# Patient Record
Sex: Male | Born: 1950
Health system: Southern US, Community
[De-identification: ages and names within clinical notes are randomized; demographics above are authoritative.]

## PROBLEM LIST (undated history)

## (undated) DIAGNOSIS — C61 Malignant neoplasm of prostate: Secondary | ICD-10-CM

## (undated) DIAGNOSIS — Z973 Presence of spectacles and contact lenses: Secondary | ICD-10-CM

## (undated) DIAGNOSIS — R160 Hepatomegaly, not elsewhere classified: Secondary | ICD-10-CM

## (undated) DIAGNOSIS — J449 Chronic obstructive pulmonary disease, unspecified: Secondary | ICD-10-CM

## (undated) DIAGNOSIS — I10 Essential (primary) hypertension: Secondary | ICD-10-CM

## (undated) DIAGNOSIS — Z860100 Personal history of colon polyps, unspecified: Secondary | ICD-10-CM

## (undated) DIAGNOSIS — N529 Male erectile dysfunction, unspecified: Secondary | ICD-10-CM

## (undated) DIAGNOSIS — M51369 Other intervertebral disc degeneration, lumbar region without mention of lumbar back pain or lower extremity pain: Secondary | ICD-10-CM

## (undated) DIAGNOSIS — I440 Atrioventricular block, first degree: Secondary | ICD-10-CM

## (undated) DIAGNOSIS — Z9989 Dependence on other enabling machines and devices: Secondary | ICD-10-CM

## (undated) DIAGNOSIS — M1990 Unspecified osteoarthritis, unspecified site: Secondary | ICD-10-CM

## (undated) DIAGNOSIS — G4733 Obstructive sleep apnea (adult) (pediatric): Secondary | ICD-10-CM

## (undated) DIAGNOSIS — T7840XA Allergy, unspecified, initial encounter: Secondary | ICD-10-CM

## (undated) DIAGNOSIS — E785 Hyperlipidemia, unspecified: Secondary | ICD-10-CM

## (undated) DIAGNOSIS — M5136 Other intervertebral disc degeneration, lumbar region: Secondary | ICD-10-CM

## (undated) DIAGNOSIS — N281 Cyst of kidney, acquired: Secondary | ICD-10-CM

## (undated) DIAGNOSIS — G4730 Sleep apnea, unspecified: Secondary | ICD-10-CM

## (undated) DIAGNOSIS — E119 Type 2 diabetes mellitus without complications: Secondary | ICD-10-CM

## (undated) DIAGNOSIS — C20 Malignant neoplasm of rectum: Secondary | ICD-10-CM

## (undated) DIAGNOSIS — Z8546 Personal history of malignant neoplasm of prostate: Secondary | ICD-10-CM

## (undated) DIAGNOSIS — I480 Paroxysmal atrial fibrillation: Secondary | ICD-10-CM

## (undated) DIAGNOSIS — IMO0002 Reserved for concepts with insufficient information to code with codable children: Secondary | ICD-10-CM

## (undated) HISTORY — DX: Essential (primary) hypertension: I10

## (undated) HISTORY — DX: Hyperlipidemia, unspecified: E78.5

## (undated) HISTORY — PX: COLONOSCOPY: SHX174

## (undated) HISTORY — DX: Allergy, unspecified, initial encounter: T78.40XA

## (undated) HISTORY — PX: UPPER GASTROINTESTINAL ENDOSCOPY: SHX188

## (undated) HISTORY — DX: Unspecified osteoarthritis, unspecified site: M19.90

## (undated) HISTORY — PX: VASECTOMY: SHX75

## (undated) HISTORY — PX: CARDIOVASCULAR STRESS TEST: SHX262

## (undated) HISTORY — PX: OTHER SURGICAL HISTORY: SHX169

## (undated) HISTORY — DX: Malignant neoplasm of prostate: C61

## (undated) HISTORY — PX: KNEE ARTHROSCOPY: SUR90

## (undated) HISTORY — PX: POLYPECTOMY: SHX149

## (undated) HISTORY — DX: Hepatomegaly, not elsewhere classified: R16.0

## (undated) HISTORY — DX: Reserved for concepts with insufficient information to code with codable children: IMO0002

## (undated) HISTORY — DX: Type 2 diabetes mellitus without complications: E11.9

## (undated) HISTORY — DX: Sleep apnea, unspecified: G47.30

---

## 1997-12-16 ENCOUNTER — Other Ambulatory Visit: Admission: RE | Admit: 1997-12-16 | Discharge: 1997-12-16 | Payer: Self-pay | Admitting: Family Medicine

## 1999-09-04 HISTORY — PX: LUMBAR FUSION: SHX111

## 2001-11-30 ENCOUNTER — Emergency Department (HOSPITAL_COMMUNITY): Admission: EM | Admit: 2001-11-30 | Discharge: 2001-11-30 | Payer: Self-pay | Admitting: Emergency Medicine

## 2001-11-30 ENCOUNTER — Encounter: Payer: Self-pay | Admitting: Emergency Medicine

## 2006-09-23 ENCOUNTER — Ambulatory Visit: Payer: Self-pay | Admitting: Internal Medicine

## 2006-10-09 ENCOUNTER — Ambulatory Visit: Payer: Self-pay | Admitting: Internal Medicine

## 2006-10-23 ENCOUNTER — Ambulatory Visit: Payer: Self-pay | Admitting: Internal Medicine

## 2007-01-23 ENCOUNTER — Ambulatory Visit: Payer: Self-pay | Admitting: Internal Medicine

## 2007-01-23 LAB — CONVERTED CEMR LAB
ALT: 20 units/L (ref 0–40)
AST: 20 units/L (ref 0–37)
Albumin: 3.5 g/dL (ref 3.5–5.2)
Alkaline Phosphatase: 56 units/L (ref 39–117)
BUN: 16 mg/dL (ref 6–23)
Basophils Absolute: 0 10*3/uL (ref 0.0–0.1)
Basophils Relative: 0.1 % (ref 0.0–1.0)
Bilirubin, Direct: 0.2 mg/dL (ref 0.0–0.3)
CO2: 34 meq/L — ABNORMAL HIGH (ref 19–32)
Calcium: 9.3 mg/dL (ref 8.4–10.5)
Chloride: 102 meq/L (ref 96–112)
Cholesterol: 163 mg/dL (ref 0–200)
Creatinine, Ser: 1 mg/dL (ref 0.4–1.5)
Eosinophils Absolute: 0.1 10*3/uL (ref 0.0–0.6)
Eosinophils Relative: 1.8 % (ref 0.0–5.0)
GFR calc Af Amer: 99 mL/min
GFR calc non Af Amer: 82 mL/min
Glucose, Bld: 136 mg/dL — ABNORMAL HIGH (ref 70–99)
HCT: 42 % (ref 39.0–52.0)
HDL: 41.7 mg/dL (ref >39.0)
Hemoglobin: 14.1 g/dL (ref 13.0–17.0)
Hgb A1c MFr Bld: 6.5 % — ABNORMAL HIGH (ref 4.6–6.0)
LDL Cholesterol: 108 mg/dL — ABNORMAL HIGH (ref 0–99)
Lymphocytes Relative: 32.1 % (ref 12.0–46.0)
MCHC: 33.7 g/dL (ref 30.0–36.0)
MCV: 92 fL (ref 78.0–100.0)
Monocytes Absolute: 0.5 10*3/uL (ref 0.2–0.7)
Monocytes Relative: 9.6 % (ref 3.0–11.0)
Neutro Abs: 2.9 10*3/uL (ref 1.4–7.7)
Neutrophils Relative %: 56.4 % (ref 43.0–77.0)
PSA: 0.94 ng/mL (ref 0.10–4.00)
Platelets: 308 10*3/uL (ref 150–400)
Potassium: 2.8 meq/L — ABNORMAL LOW (ref 3.5–5.1)
RBC: 4.56 M/uL (ref 4.22–5.81)
RDW: 12.1 % (ref 11.5–14.6)
Sodium: 142 meq/L (ref 135–145)
TSH: 1.38 microintl units/mL (ref 0.35–5.50)
Total Bilirubin: 1.2 mg/dL (ref 0.3–1.2)
Total CHOL/HDL Ratio: 3.9
Total Protein: 6.7 g/dL (ref 6.0–8.3)
Triglycerides: 69 mg/dL (ref 0–149)
VLDL: 14 mg/dL (ref 0–40)
Vitamin B-12: 670 pg/mL (ref 211–911)
WBC: 5.1 10*3/uL (ref 4.5–10.5)

## 2007-05-27 ENCOUNTER — Ambulatory Visit: Payer: Self-pay | Admitting: Internal Medicine

## 2007-05-27 LAB — CONVERTED CEMR LAB
ALT: 25 units/L (ref 0–53)
AST: 24 units/L (ref 0–37)
BUN: 18 mg/dL (ref 6–23)
CO2: 33 meq/L — ABNORMAL HIGH (ref 19–32)
Calcium: 9.8 mg/dL (ref 8.4–10.5)
Chloride: 101 meq/L (ref 96–112)
Cholesterol: 199 mg/dL (ref 0–200)
Creatinine, Ser: 1.1 mg/dL (ref 0.4–1.5)
GFR calc Af Amer: 89 mL/min
GFR calc non Af Amer: 73 mL/min
Glucose, Bld: 103 mg/dL — ABNORMAL HIGH (ref 70–99)
HDL: 45.8 mg/dL (ref >39.0)
Hgb A1c MFr Bld: 6.4 % — ABNORMAL HIGH (ref 4.6–6.0)
LDL Cholesterol: 137 mg/dL — ABNORMAL HIGH (ref 0–99)
Potassium: 3.4 meq/L — ABNORMAL LOW (ref 3.5–5.1)
Sodium: 143 meq/L (ref 135–145)
Total CHOL/HDL Ratio: 4.3
Triglycerides: 83 mg/dL (ref 0–149)
VLDL: 17 mg/dL (ref 0–40)

## 2007-10-30 ENCOUNTER — Encounter: Payer: Self-pay | Admitting: Internal Medicine

## 2007-10-30 DIAGNOSIS — M545 Low back pain: Secondary | ICD-10-CM

## 2007-10-30 DIAGNOSIS — I10 Essential (primary) hypertension: Secondary | ICD-10-CM

## 2007-10-30 DIAGNOSIS — M1990 Unspecified osteoarthritis, unspecified site: Secondary | ICD-10-CM | POA: Insufficient documentation

## 2007-10-30 DIAGNOSIS — F528 Other sexual dysfunction not due to a substance or known physiological condition: Secondary | ICD-10-CM

## 2007-11-27 ENCOUNTER — Ambulatory Visit: Payer: Self-pay | Admitting: Internal Medicine

## 2007-11-27 DIAGNOSIS — M25519 Pain in unspecified shoulder: Secondary | ICD-10-CM

## 2007-11-27 DIAGNOSIS — E118 Type 2 diabetes mellitus with unspecified complications: Secondary | ICD-10-CM

## 2007-12-18 ENCOUNTER — Telehealth (INDEPENDENT_AMBULATORY_CARE_PROVIDER_SITE_OTHER): Payer: Self-pay | Admitting: *Deleted

## 2008-02-02 ENCOUNTER — Encounter: Payer: Self-pay | Admitting: Internal Medicine

## 2008-02-03 ENCOUNTER — Ambulatory Visit: Payer: Self-pay | Admitting: Internal Medicine

## 2008-02-03 DIAGNOSIS — R55 Syncope and collapse: Secondary | ICD-10-CM | POA: Insufficient documentation

## 2008-02-03 DIAGNOSIS — E876 Hypokalemia: Secondary | ICD-10-CM

## 2008-02-03 DIAGNOSIS — J019 Acute sinusitis, unspecified: Secondary | ICD-10-CM | POA: Insufficient documentation

## 2008-02-05 LAB — CONVERTED CEMR LAB
ALT: 26 units/L (ref 0–53)
AST: 22 units/L (ref 0–37)
Albumin: 3.6 g/dL (ref 3.5–5.2)
Alkaline Phosphatase: 60 units/L (ref 39–117)
BUN: 13 mg/dL (ref 6–23)
Basophils Absolute: 0.1 10*3/uL (ref 0.0–0.1)
Basophils Relative: 1 % (ref 0.0–1.0)
Bilirubin Urine: NEGATIVE
Bilirubin, Direct: 0.1 mg/dL (ref 0.0–0.3)
CO2: 34 meq/L — ABNORMAL HIGH (ref 19–32)
Calcium: 9.8 mg/dL (ref 8.4–10.5)
Chloride: 96 meq/L (ref 96–112)
Creatinine, Ser: 0.9 mg/dL (ref 0.4–1.5)
Eosinophils Absolute: 0.1 10*3/uL (ref 0.0–0.7)
Eosinophils Relative: 1.8 % (ref 0.0–5.0)
GFR calc Af Amer: 111 mL/min
GFR calc non Af Amer: 92 mL/min
Glucose, Bld: 98 mg/dL (ref 70–99)
HCT: 43.5 % (ref 39.0–52.0)
Hemoglobin, Urine: NEGATIVE
Hemoglobin: 15.2 g/dL (ref 13.0–17.0)
Hgb A1c MFr Bld: 6.4 % — ABNORMAL HIGH (ref 4.6–6.0)
Ketones, ur: NEGATIVE mg/dL
Leukocytes, UA: NEGATIVE
Lymphocytes Relative: 41.5 % (ref 12.0–46.0)
MCHC: 35 g/dL (ref 30.0–36.0)
MCV: 91.1 fL (ref 78.0–100.0)
Monocytes Absolute: 0.6 10*3/uL (ref 0.1–1.0)
Monocytes Relative: 8.6 % (ref 3.0–12.0)
Neutro Abs: 3.6 10*3/uL (ref 1.4–7.7)
Neutrophils Relative %: 47.1 % (ref 43.0–77.0)
Nitrite: NEGATIVE
Platelets: 335 10*3/uL (ref 150–400)
Potassium: 3.1 meq/L — ABNORMAL LOW (ref 3.5–5.1)
RBC: 4.77 M/uL (ref 4.22–5.81)
RDW: 12.3 % (ref 11.5–14.6)
Sodium: 142 meq/L (ref 135–145)
Specific Gravity, Urine: 1.02 (ref 1.000–1.03)
TSH: 1.83 microintl units/mL (ref 0.35–5.50)
Total Bilirubin: 1.2 mg/dL (ref 0.3–1.2)
Total Protein, Urine: NEGATIVE mg/dL
Total Protein: 7.3 g/dL (ref 6.0–8.3)
Urine Glucose: NEGATIVE mg/dL
Urobilinogen, UA: 1 (ref 0.0–1.0)
WBC: 7.5 10*3/uL (ref 4.5–10.5)
pH: 7.5 (ref 5.0–8.0)

## 2008-03-02 ENCOUNTER — Ambulatory Visit: Payer: Self-pay | Admitting: Internal Medicine

## 2008-03-02 DIAGNOSIS — R05 Cough: Secondary | ICD-10-CM

## 2008-03-02 DIAGNOSIS — E785 Hyperlipidemia, unspecified: Secondary | ICD-10-CM | POA: Insufficient documentation

## 2008-03-02 DIAGNOSIS — K921 Melena: Secondary | ICD-10-CM

## 2008-07-14 ENCOUNTER — Ambulatory Visit: Payer: Self-pay | Admitting: Internal Medicine

## 2008-07-15 ENCOUNTER — Encounter: Payer: Self-pay | Admitting: Internal Medicine

## 2008-07-15 LAB — CONVERTED CEMR LAB
ALT: 26 units/L (ref 0–53)
AST: 26 units/L (ref 0–37)
Albumin: 3.5 g/dL (ref 3.5–5.2)
Alkaline Phosphatase: 58 units/L (ref 39–117)
BUN: 13 mg/dL (ref 6–23)
Basophils Absolute: 0 10*3/uL (ref 0.0–0.1)
Basophils Relative: 0.6 % (ref 0.0–3.0)
Bilirubin Urine: NEGATIVE
Bilirubin, Direct: 0.2 mg/dL (ref 0.0–0.3)
CO2: 35 meq/L — ABNORMAL HIGH (ref 19–32)
Calcium: 9.1 mg/dL (ref 8.4–10.5)
Chloride: 96 meq/L (ref 96–112)
Cholesterol: 204 mg/dL (ref 0–200)
Creatinine, Ser: 1 mg/dL (ref 0.4–1.5)
Direct LDL: 125.4 mg/dL
Eosinophils Absolute: 0.1 10*3/uL (ref 0.0–0.7)
Eosinophils Relative: 1.7 % (ref 0.0–5.0)
GFR calc Af Amer: 99 mL/min
GFR calc non Af Amer: 82 mL/min
Glucose, Bld: 127 mg/dL — ABNORMAL HIGH (ref 70–99)
HCT: 43.2 % (ref 39.0–52.0)
HDL: 42.6 mg/dL (ref >39.0)
Hemoglobin, Urine: NEGATIVE
Hemoglobin: 14.8 g/dL (ref 13.0–17.0)
Ketones, ur: NEGATIVE mg/dL
Leukocytes, UA: NEGATIVE
Lymphocytes Relative: 39.4 % (ref 12.0–46.0)
MCHC: 34.3 g/dL (ref 30.0–36.0)
MCV: 89.7 fL (ref 78.0–100.0)
Monocytes Absolute: 0.6 10*3/uL (ref 0.1–1.0)
Monocytes Relative: 8.3 % (ref 3.0–12.0)
Neutro Abs: 3.4 10*3/uL (ref 1.4–7.7)
Neutrophils Relative %: 50 % (ref 43.0–77.0)
Nitrite: NEGATIVE
PSA: 1.13 ng/mL (ref 0.10–4.00)
Platelets: 314 10*3/uL (ref 150–400)
Potassium: 2.9 meq/L — ABNORMAL LOW (ref 3.5–5.1)
RBC: 4.81 M/uL (ref 4.22–5.81)
RDW: 12.3 % (ref 11.5–14.6)
Sodium: 140 meq/L (ref 135–145)
Specific Gravity, Urine: 1.01 (ref 1.000–1.03)
TSH: 1.98 microintl units/mL (ref 0.35–5.50)
Total Bilirubin: 1.4 mg/dL — ABNORMAL HIGH (ref 0.3–1.2)
Total CHOL/HDL Ratio: 4.8
Total Protein, Urine: NEGATIVE mg/dL
Total Protein: 7.1 g/dL (ref 6.0–8.3)
Triglycerides: 89 mg/dL (ref 0–149)
Urine Glucose: NEGATIVE mg/dL
Urobilinogen, UA: 0.2 (ref 0.0–1.0)
VLDL: 18 mg/dL (ref 0–40)
WBC: 6.7 10*3/uL (ref 4.5–10.5)
pH: 7.5 (ref 5.0–8.0)

## 2008-07-16 ENCOUNTER — Encounter: Payer: Self-pay | Admitting: Internal Medicine

## 2008-11-16 ENCOUNTER — Ambulatory Visit: Payer: Self-pay | Admitting: Internal Medicine

## 2008-11-17 LAB — CONVERTED CEMR LAB
ALT: 21 units/L (ref 0–53)
AST: 21 units/L (ref 0–37)
Albumin: 3.5 g/dL (ref 3.5–5.2)
Alkaline Phosphatase: 58 units/L (ref 39–117)
BUN: 12 mg/dL (ref 6–23)
Basophils Absolute: 0.1 10*3/uL (ref 0.0–0.1)
Basophils Relative: 2 % (ref 0.0–3.0)
Bilirubin, Direct: 0.1 mg/dL (ref 0.0–0.3)
CO2: 30 meq/L (ref 19–32)
Calcium: 9.3 mg/dL (ref 8.4–10.5)
Chloride: 107 meq/L (ref 96–112)
Creatinine, Ser: 0.8 mg/dL (ref 0.4–1.5)
Eosinophils Absolute: 0.1 10*3/uL (ref 0.0–0.7)
Eosinophils Relative: 1.6 % (ref 0.0–5.0)
GFR calc non Af Amer: 127.17 mL/min (ref >60)
Glucose, Bld: 99 mg/dL (ref 70–99)
HCT: 43.9 % (ref 39.0–52.0)
Hemoglobin: 15 g/dL (ref 13.0–17.0)
Hgb A1c MFr Bld: 6.3 % (ref 4.6–6.5)
Lymphocytes Relative: 33 % (ref 12.0–46.0)
Lymphs Abs: 1.9 10*3/uL (ref 0.7–4.0)
MCHC: 34.2 g/dL (ref 30.0–36.0)
MCV: 89.1 fL (ref 78.0–100.0)
Monocytes Absolute: 0.4 10*3/uL (ref 0.1–1.0)
Monocytes Relative: 7.3 % (ref 3.0–12.0)
Neutro Abs: 3.2 10*3/uL (ref 1.4–7.7)
Neutrophils Relative %: 56.1 % (ref 43.0–77.0)
Platelets: 302 10*3/uL (ref 150.0–400.0)
Potassium: 3.7 meq/L (ref 3.5–5.1)
RBC: 4.93 M/uL (ref 4.22–5.81)
RDW: 12.9 % (ref 11.5–14.6)
Sodium: 144 meq/L (ref 135–145)
TSH: 1.7 microintl units/mL (ref 0.35–5.50)
Total Bilirubin: 1.2 mg/dL (ref 0.3–1.2)
Total Protein: 7 g/dL (ref 6.0–8.3)
WBC: 5.7 10*3/uL (ref 4.5–10.5)

## 2008-12-07 ENCOUNTER — Ambulatory Visit: Payer: Self-pay | Admitting: Internal Medicine

## 2008-12-07 DIAGNOSIS — K625 Hemorrhage of anus and rectum: Secondary | ICD-10-CM

## 2008-12-07 DIAGNOSIS — Z860100 Personal history of colon polyps, unspecified: Secondary | ICD-10-CM | POA: Insufficient documentation

## 2009-01-05 ENCOUNTER — Telehealth: Payer: Self-pay | Admitting: Internal Medicine

## 2009-03-04 ENCOUNTER — Ambulatory Visit: Payer: Self-pay | Admitting: Internal Medicine

## 2009-03-22 ENCOUNTER — Ambulatory Visit: Payer: Self-pay | Admitting: Internal Medicine

## 2009-03-22 ENCOUNTER — Encounter: Payer: Self-pay | Admitting: Internal Medicine

## 2009-03-24 ENCOUNTER — Encounter: Payer: Self-pay | Admitting: Internal Medicine

## 2009-03-25 ENCOUNTER — Telehealth: Payer: Self-pay | Admitting: Internal Medicine

## 2009-06-17 ENCOUNTER — Encounter (INDEPENDENT_AMBULATORY_CARE_PROVIDER_SITE_OTHER): Payer: Self-pay | Admitting: *Deleted

## 2009-06-23 ENCOUNTER — Encounter: Payer: Self-pay | Admitting: Internal Medicine

## 2009-07-08 ENCOUNTER — Ambulatory Visit: Payer: Self-pay | Admitting: Internal Medicine

## 2009-07-08 DIAGNOSIS — J449 Chronic obstructive pulmonary disease, unspecified: Secondary | ICD-10-CM

## 2009-07-08 DIAGNOSIS — Z87891 Personal history of nicotine dependence: Secondary | ICD-10-CM

## 2009-08-19 ENCOUNTER — Observation Stay (HOSPITAL_COMMUNITY): Admission: AD | Admit: 2009-08-19 | Discharge: 2009-08-20 | Payer: Self-pay | Admitting: Cardiology

## 2009-08-19 ENCOUNTER — Ambulatory Visit: Payer: Self-pay | Admitting: Internal Medicine

## 2009-08-19 ENCOUNTER — Ambulatory Visit: Payer: Self-pay | Admitting: Cardiology

## 2009-08-19 DIAGNOSIS — R079 Chest pain, unspecified: Secondary | ICD-10-CM | POA: Insufficient documentation

## 2009-08-19 LAB — CONVERTED CEMR LAB: Blood Glucose, Fingerstick: 110

## 2009-08-20 ENCOUNTER — Encounter: Payer: Self-pay | Admitting: Internal Medicine

## 2009-08-22 ENCOUNTER — Telehealth: Payer: Self-pay | Admitting: Internal Medicine

## 2009-08-31 ENCOUNTER — Ambulatory Visit: Payer: Self-pay | Admitting: Internal Medicine

## 2009-09-07 ENCOUNTER — Telehealth (INDEPENDENT_AMBULATORY_CARE_PROVIDER_SITE_OTHER): Payer: Self-pay | Admitting: *Deleted

## 2009-09-08 ENCOUNTER — Encounter (INDEPENDENT_AMBULATORY_CARE_PROVIDER_SITE_OTHER): Payer: Self-pay | Admitting: *Deleted

## 2009-09-08 ENCOUNTER — Ambulatory Visit: Payer: Self-pay

## 2009-09-08 ENCOUNTER — Encounter: Payer: Self-pay | Admitting: Cardiovascular Disease

## 2009-09-08 ENCOUNTER — Ambulatory Visit: Payer: Self-pay | Admitting: Internal Medicine

## 2009-09-08 ENCOUNTER — Encounter (HOSPITAL_COMMUNITY): Admission: RE | Admit: 2009-09-08 | Discharge: 2009-11-07 | Payer: Self-pay | Admitting: Internal Medicine

## 2009-11-08 ENCOUNTER — Ambulatory Visit: Payer: Self-pay | Admitting: Internal Medicine

## 2009-11-08 DIAGNOSIS — M79609 Pain in unspecified limb: Secondary | ICD-10-CM

## 2009-11-10 LAB — CONVERTED CEMR LAB
ALT: 25 units/L (ref 0–53)
AST: 23 units/L (ref 0–37)
Albumin: 3.7 g/dL (ref 3.5–5.2)
Alkaline Phosphatase: 58 units/L (ref 39–117)
BUN: 14 mg/dL (ref 6–23)
Basophils Absolute: 0 10*3/uL (ref 0.0–0.1)
Basophils Relative: 0.5 % (ref 0.0–3.0)
Bilirubin Urine: NEGATIVE
Bilirubin, Direct: 0.2 mg/dL (ref 0.0–0.3)
CO2: 32 meq/L (ref 19–32)
Calcium: 9.4 mg/dL (ref 8.4–10.5)
Chloride: 101 meq/L (ref 96–112)
Cholesterol: 200 mg/dL (ref 0–200)
Creatinine, Ser: 0.9 mg/dL (ref 0.4–1.5)
Eosinophils Absolute: 0.1 10*3/uL (ref 0.0–0.7)
Eosinophils Relative: 1.6 % (ref 0.0–5.0)
GFR calc non Af Amer: 110.64 mL/min (ref >60)
Glucose, Bld: 117 mg/dL — ABNORMAL HIGH (ref 70–99)
HCT: 44 % (ref 39.0–52.0)
HDL: 52.5 mg/dL (ref >39.00)
Hemoglobin, Urine: NEGATIVE
Hemoglobin: 14.5 g/dL (ref 13.0–17.0)
Hgb A1c MFr Bld: 6.1 % (ref 4.6–6.5)
Ketones, ur: NEGATIVE mg/dL
LDL Cholesterol: 123 mg/dL — ABNORMAL HIGH (ref 0–99)
Leukocytes, UA: NEGATIVE
Lymphocytes Relative: 37.3 % (ref 12.0–46.0)
Lymphs Abs: 2.2 10*3/uL (ref 0.7–4.0)
MCHC: 32.9 g/dL (ref 30.0–36.0)
MCV: 92.6 fL (ref 78.0–100.0)
Monocytes Absolute: 0.5 10*3/uL (ref 0.1–1.0)
Monocytes Relative: 8.8 % (ref 3.0–12.0)
Neutro Abs: 3.2 10*3/uL (ref 1.4–7.7)
Neutrophils Relative %: 51.8 % (ref 43.0–77.0)
Nitrite: NEGATIVE
PSA: 1.38 ng/mL (ref 0.10–4.00)
Platelets: 319 10*3/uL (ref 150.0–400.0)
Potassium: 3.3 meq/L — ABNORMAL LOW (ref 3.5–5.1)
RBC: 4.75 M/uL (ref 4.22–5.81)
RDW: 12.2 % (ref 11.5–14.6)
Sodium: 141 meq/L (ref 135–145)
Specific Gravity, Urine: 1.025 (ref 1.000–1.030)
TSH: 2.49 microintl units/mL (ref 0.35–5.50)
Total Bilirubin: 1 mg/dL (ref 0.3–1.2)
Total CHOL/HDL Ratio: 4
Total Protein, Urine: NEGATIVE mg/dL
Total Protein: 7.2 g/dL (ref 6.0–8.3)
Triglycerides: 123 mg/dL (ref 0.0–149.0)
Urine Glucose: NEGATIVE mg/dL
Urobilinogen, UA: 0.2 (ref 0.0–1.0)
VLDL: 24.6 mg/dL (ref 0.0–40.0)
WBC: 6 10*3/uL (ref 4.5–10.5)
pH: 6.5 (ref 5.0–8.0)

## 2010-01-09 ENCOUNTER — Telehealth: Payer: Self-pay | Admitting: Internal Medicine

## 2010-01-27 ENCOUNTER — Telehealth: Payer: Self-pay | Admitting: Internal Medicine

## 2010-02-01 ENCOUNTER — Telehealth (INDEPENDENT_AMBULATORY_CARE_PROVIDER_SITE_OTHER): Payer: Self-pay | Admitting: *Deleted

## 2010-03-09 ENCOUNTER — Ambulatory Visit: Payer: Self-pay | Admitting: Internal Medicine

## 2010-03-09 DIAGNOSIS — T6701XA Heatstroke and sunstroke, initial encounter: Secondary | ICD-10-CM

## 2010-03-09 DIAGNOSIS — R269 Unspecified abnormalities of gait and mobility: Secondary | ICD-10-CM

## 2010-03-09 DIAGNOSIS — F29 Unspecified psychosis not due to a substance or known physiological condition: Secondary | ICD-10-CM | POA: Insufficient documentation

## 2010-03-10 LAB — CONVERTED CEMR LAB
ALT: 22 units/L (ref 0–53)
AST: 23 units/L (ref 0–37)
Albumin: 3.7 g/dL (ref 3.5–5.2)
Alkaline Phosphatase: 58 units/L (ref 39–117)
BUN: 17 mg/dL (ref 6–23)
Basophils Absolute: 0 10*3/uL (ref 0.0–0.1)
Basophils Relative: 0.6 % (ref 0.0–3.0)
Bilirubin Urine: NEGATIVE
Bilirubin, Direct: 0.2 mg/dL (ref 0.0–0.3)
CO2: 31 meq/L (ref 19–32)
Calcium: 9.5 mg/dL (ref 8.4–10.5)
Chloride: 101 meq/L (ref 96–112)
Creatinine, Ser: 0.9 mg/dL (ref 0.4–1.5)
Eosinophils Absolute: 0.1 10*3/uL (ref 0.0–0.7)
Eosinophils Relative: 1.9 % (ref 0.0–5.0)
GFR calc non Af Amer: 113.42 mL/min (ref >60)
Glucose, Bld: 85 mg/dL (ref 70–99)
HCT: 43 % (ref 39.0–52.0)
Hemoglobin, Urine: NEGATIVE
Hemoglobin: 14.8 g/dL (ref 13.0–17.0)
Hgb A1c MFr Bld: 6.1 % (ref 4.6–6.5)
Ketones, ur: NEGATIVE mg/dL
Leukocytes, UA: NEGATIVE
Lymphocytes Relative: 40.2 % (ref 12.0–46.0)
Lymphs Abs: 2.4 10*3/uL (ref 0.7–4.0)
MCHC: 34.3 g/dL (ref 30.0–36.0)
MCV: 92.3 fL (ref 78.0–100.0)
Monocytes Absolute: 0.6 10*3/uL (ref 0.1–1.0)
Monocytes Relative: 9.7 % (ref 3.0–12.0)
Neutro Abs: 2.8 10*3/uL (ref 1.4–7.7)
Neutrophils Relative %: 47.6 % (ref 43.0–77.0)
Nitrite: NEGATIVE
Platelets: 324 10*3/uL (ref 150.0–400.0)
Potassium: 3.6 meq/L (ref 3.5–5.1)
RBC: 4.66 M/uL (ref 4.22–5.81)
RDW: 13.1 % (ref 11.5–14.6)
Sed Rate: 21 mm/hr (ref 0–22)
Sodium: 140 meq/L (ref 135–145)
Specific Gravity, Urine: 1.02 (ref 1.000–1.030)
TSH: 2.27 microintl units/mL (ref 0.35–5.50)
Total Bilirubin: 1.4 mg/dL — ABNORMAL HIGH (ref 0.3–1.2)
Total Protein, Urine: NEGATIVE mg/dL
Total Protein: 7.2 g/dL (ref 6.0–8.3)
Urine Glucose: NEGATIVE mg/dL
Urobilinogen, UA: 0.2 (ref 0.0–1.0)
Vitamin B-12: 633 pg/mL (ref 211–911)
WBC: 5.9 10*3/uL (ref 4.5–10.5)
pH: 6 (ref 5.0–8.0)

## 2010-09-24 ENCOUNTER — Encounter: Payer: Self-pay | Admitting: Internal Medicine

## 2010-10-03 NOTE — Miscellaneous (Signed)
Summary: Outpatient Coinsurance Notice  Outpatient Coinsurance Notice   Imported By: Marylou Mccoy 09/13/2009 15:47:37  _____________________________________________________________________  External Attachment:    Type:   Image     Comment:   External Document

## 2010-10-03 NOTE — Progress Notes (Signed)
  Phone Note Call from Patient   Caller: Patient Summary of Call: Patient called stating he complete a release form and asked for it to be placed on file for future use. After discussion with Pam, she authorized for the release to be printed and forwarded to [facility] for processing.  Request: Send information

## 2010-10-03 NOTE — Assessment & Plan Note (Signed)
Summary: Cardiology Nuclear Study  Nuclear Med Background Indications for Stress Test: Evaluation for Ischemia, Post Hospital, Abnormal EKG  Indications Comments: 08/20/09 CP, R/O MI, (-) enzymes   History Comments: NO DOCUMENTED CAD  Symptoms: Chest Pain, Nausea  Symptoms Comments: Last episode of ZO:XWRU since admission.   Nuclear Pre-Procedure Cardiac Risk Factors: History of Smoking, Hypertension, Lipids, NIDDM Caffeine/Decaff Intake: None NPO After: 6:00 AM Lungs: Clear IV 0.9% NS with Angio Cath: 20g     IV Site: (R) AC IV Started by: Stanton Kidney EMT-P Chest Size (in) 44-46     Height (in): 74 Weight (lb): 235 BMI: 30.28 Tech Comments: FBS:100 @5 :45 am per patient  Nuclear Med Study 1 or 2 day study:  1 day     Stress Test Type:  Stress Reading MD:  Dietrich Pates, MD     Referring MD:  Sonda Primes, MD Resting Radionuclide:  Technetium 39m Tetrofosmin     Resting Radionuclide Dose:  11.0 mCi  Stress Radionuclide:  Technetium 50m Tetrofosmin     Stress Radionuclide Dose:  33.0 mCi   Stress Protocol Exercise Time (min):  9:00 min     Max HR:  155 bpm     Predicted Max HR:  161 bpm  Max Systolic BP: 195 mm Hg     Percent Max HR:  96.27 %     METS: 10.4 Rate Pressure Product:  04540    Stress Test Technologist:  Rea College CMA-N     Nuclear Technologist:  Burna Mortimer Deal RT-N  Rest Procedure  Myocardial perfusion imaging was performed at rest 45 minutes following the intravenous administration of Myoview Technetium 44m Tetrofosmin.  Stress Procedure  The patient exercised for nine minutes.  The patient stopped due to fatigue and dyspnea.  He denied any chest pain.  There were no significant ST-T wave changes, only frequent PVC's with one episode of a multifocal triplet.  Myoview was injected at peak exercise and myocardial perfusion imaging was performed after a brief delay.  QPS Raw Data Images:  Stress images were motion corrected.  Soft tissue (diaphragm, bowel  activity) underlie heart. Stress Images:  There is normal uptake in all areas. Rest Images:  Normal homogeneous uptake in all areas of the myocardium. Subtraction (SDS):  No evidence of ischemia. Transient Ischemic Dilatation:  .92  (Normal <1.22)  Lung/Heart Ratio:  .33  (Normal <0.45)  Quantitative Gated Spect Images QGS EDV:  118 ml QGS ESV:  46 ml QGS EF:  61 %   Overall Impression  Exercise Capacity: Good exercise capacity. BP Response: Normal blood pressure response. Clinical Symptoms: No chest pain ECG Impression: No significant ST segment change suggestive of ischemia. Overall Impression: Normal stress nuclear study.  Appended Document: Cardiology Nuclear Sharmon Leyden, please, inform - normal stress test   Thanks, AP  Appended Document: Cardiology Nuclear Study called pt-------lmom with normal stress test results/vg

## 2010-10-03 NOTE — Progress Notes (Signed)
Summary: cialis  Phone Note From Pharmacy   Caller: [facility]* Summary of Call: Per pharmacy, pt request refill on cialis 20mg  once daily as needed . This was removed off of EMR medication list , please advise  Initial call taken by: Rock Nephew CMA,  Jan 27, 2010 9:29 AM  Follow-up for Phone Call        ok to ref x 6 Follow-up by: Tresa Garter MD,  Jan 27, 2010 3:24 PM    New/Updated Medications: CIALIS 20 MG TABS (TADALAFIL) 1 once daily as needed Prescriptions: CIALIS 20 MG TABS (TADALAFIL) 1 once daily as needed  #12 x 1   Entered by:   Lamar Sprinkles, CMA   Authorized by:   Tresa Garter MD   Signed by:   Lamar Sprinkles, CMA on 01/27/2010   Method used:   Electronically to        [facility]* (retail)       [address]       [place], [place]  [address]       Ph: [phone]       Fax: [phone]   RxID:   [id number]

## 2010-10-03 NOTE — Assessment & Plan Note (Signed)
Summary: 4 MO ROV /NWS  #   Vital Signs:  Patient profile:   60 year old male Weight:      234 pounds Temp:     98.4 degrees F oral Pulse rate:   81 / minute BP sitting:   146 / 82  (left arm)  Vitals Entered By: Tora Perches (November 08, 2009 8:05 AM) CC: f/u Is Patient Diabetic? No   Primary Care Provider:  Jacinta Shoe MD  CC:  f/u.  History of Present Illness: The patient presents for a wellness examination  The patient presents for a follow up of hypertension, diabetes, hyperlipidemia  C/o R foot pain in am x weeks  Preventive Screening-Counseling & Management  Alcohol-Tobacco     Smoking Status: quit  Caffeine-Diet-Exercise     Does Patient Exercise: yes  Current Medications (verified): 1)  Actoplus Met 15-850 Mg  Tabs (Pioglitazone Hcl-Metformin Hcl) .... Take 1 Tablet By Mouth Two Times A Day 2)  Meloxicam 15 Mg Tabs (Meloxicam) .... One By Mouth Daily Pc As Needed Arthritis 3)  Adult Aspirin Low Strength 81 Mg  Tbdp (Aspirin) .... Once Daily 4)  Tribenzor 40-10-25 Mg Tabs (Olmesartan-Amlodipine-Hctz) .Marland Kitchen.. 1 By Mouth Qd 5)  Vitamin D3 1000 Unit  Tabs (Cholecalciferol) .Marland Kitchen.. 1 By Mouth Daily 6)  Aspirin 81 Mg Tbec (Aspirin) .Marland Kitchen.. 1 By Mouth Qd  Allergies: 1)  ! 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, still teaching Married Former Smoker Alcohol use-no Regular exercise-yes, walks Does Patient Exercise:  yes  Review of Systems  The patient denies anorexia, fever, weight loss, weight gain, vision loss, decreased hearing, hoarseness, chest pain,  syncope, dyspnea on exertion, peripheral edema, prolonged cough, headaches, hemoptysis, abdominal pain, melena, hematochezia, severe indigestion/heartburn, hematuria, incontinence, genital sores, muscle weakness, suspicious skin lesions, transient blindness, difficulty walking, depression, unusual weight change, abnormal bleeding, enlarged lymph nodes, angioedema, and testicular masses.    Physical Exam  General:  alert, well-developed, well-nourished, and cooperative to examination.    Ears:  normal pinnae bilaterally, without erythema, swelling, or tenderness to palpation. TMs clear, without effusion, or cerumen impaction. Hearing grossly normal bilaterally  Nose:  No deformity, discharge,  or lesions. Mouth:  teeth and gums in good repair; mucous membranes moist, without lesions or ulcers. oropharynx clear without exudate, erythema.  Lungs:  normal respiratory effort, no intercostal retractions or use of accessory muscles; normal breath sounds bilaterally - no crackles and no wheezes.    Heart:  normal rate, regular rhythm, no murmur, and no rub. BLE without edema. normal DP pulses and normal cap refill in all 4 extremities    Abdomen:  soft, non-tender, normal bowel sounds, no distention; no masses and no appreciable hepatomegaly or splenomegaly.   Rectal:  No external abnormalities noted. Normal sphincter tone. No rectal masses or tenderness. Genitalia:  Testes bilaterally descended without nodularity, tenderness or masses. No scrotal masses or lesions. No penis lesions or urethral discharge. Prostate:  no gland enlargement and no nodules.   Msk:  No deformity or scoliosis noted of thoracic or lumbar spine.  R heel is tender Extremities:  No clubbing, cyanosis, edema, or  deformity noted with normal full range of motion of all joints.   Neurologic:  alert & oriented X3 and cranial nerves II-XII symetrically intact.  strength normal in all extremities, sensation intact to light touch, and gait  normal. speech fluent without dysarthria or aphasia; follows commands with good comprehension.  Skin:  Intact without suspicious lesions or rashes Cervical Nodes:  No lymphadenopathy noted Inguinal Nodes:  No significant adenopathy Psych:  Cognition and judgment appear intact. Alert and cooperative with normal attention span and concentration. No apparent delusions, illusions, hallucinations   Impression & Recommendations:  Problem # 1:  PHYSICAL EXAMINATION (ICD-V70.0) Assessment New Labs is pending  Health and age related issues were discussed. Available screening tests and vaccinations were discussed as well. Healthy life style including good diet and execise was discussed.   Problem # 2:  HYPERLIPIDEMIA (ICD-272.4) Assessment: Unchanged  Problem # 3:  DIABETES MELLITUS, TYPE II (ICD-250.00)  His updated medication list for this problem includes:    Actoplus Met 15-850 Mg Tabs (Pioglitazone hcl-metformin hcl) .Marland Kitchen... Take 1 tablet by mouth two times a day    Adult Aspirin Low Strength 81 Mg Tbdp (Aspirin) ..... Once daily    Tribenzor 40-10-25 Mg Tabs (Olmesartan-amlodipine-hctz) .Marland Kitchen... 1 by mouth qd    Aspirin 81 Mg Tbec (Aspirin) .Marland Kitchen... 1 by mouth qd  Problem # 4:  HYPOKALEMIA (ICD-276.8) Assessment: Improved  Problem # 5:  FOOT PAIN (ICD-729.5) R plantar fasciitis  Problem # 6:  CHEST PAIN UNSPECIFIED (ICD-786.50) resolved Assessment: Comment Only  Complete Medication List: 1)  Actoplus Met 15-850 Mg Tabs (Pioglitazone hcl-metformin hcl) .... Take 1 tablet by mouth two times a day 2)  Meloxicam 15 Mg Tabs (Meloxicam) .... One by mouth daily pc as needed arthritis 3)  Adult Aspirin Low Strength 81 Mg Tbdp (Aspirin) .... Once daily 4)  Tribenzor 40-10-25 Mg Tabs (Olmesartan-amlodipine-hctz) .Marland Kitchen.. 1 by mouth qd 5)  Vitamin D3 1000 Unit Tabs (Cholecalciferol) .Marland Kitchen.. 1 by mouth daily 6)  Aspirin 81 Mg Tbec (Aspirin) .Marland Kitchen.. 1 by mouth qd 7)  Pennsaid 1.5 % Soln (Diclofenac sodium) ....  3-5 gtt on skin three times a day for pain  Other Orders: Pneumococcal Vaccine (19147) Admin 1st Vaccine (82956) Admin 1st Vaccine [facility]) [id number])  Patient Instructions: 1)  Please schedule a follow-up appointment in 4 months. 2)  BMP prior to visit, ICD-9: 3)  HbgA1C prior to visit, ICD-9:250.00 4)  Ice to R heel as needed  Prescriptions: PENNSAID 1.5 % SOLN (DICLOFENAC SODIUM) 3-5 gtt on skin three times a day for pain  #1 x 3   Entered and Authorized by:   Tresa Garter MD   Signed by:   Tresa Garter MD on 11/08/2009   Method used:   Print then Give to Patient   RxID:   [id number]    Pneumovax Vaccine    Vaccine Type: Pneumovax    Site: left deltoid    Mfr: Merck    Dose: 0.5 ml    Route: IM    Given by: Tora Perches    Exp. Date: 04/17/2011    Lot #: [id number]    VIS given: 03/31/96 version given November 08, 2009.

## 2010-10-03 NOTE — Progress Notes (Signed)
Summary: Nuclear Pre-Procedure  Phone Note Call from Patient   Caller: Spouse Summary of Call: Reviewed information on Myoview Information Sheet (see scanned document for further details).  Spoke with patient's wife.         Nuclear Med Background Indications for Stress Test: Evaluation for Ischemia, Post Hospital  Indications Comments: 08/20/09 CP R/O MI (-) enzymes    Symptoms: Chest Pain, Nausea    Nuclear Pre-Procedure Cardiac Risk Factors: History of Smoking, Hypertension, Lipids, NIDDM Height (in): 74  Nuclear Med Study Referring MD:  A.V. Plotnikov

## 2010-10-03 NOTE — Assessment & Plan Note (Signed)
Summary: 4 mos f/u #cd   Vital Signs:  Patient profile:   60 year old male Height:      74 inches (187.96 cm) Weight:      236 pounds (107.27 kg) BMI:     30.41 O2 Sat:      98 % on Room air Temp:     97.9 degrees F (36.61 degrees C) oral Pulse rate:   82 / minute Pulse rhythm:   regular Resp:     16 per minute BP sitting:   120 / 70  (left arm) Cuff size:   regular  Vitals Entered By: Lanier Prude, CMA(AAMA) (March 09, 2010 9:21 AM)  O2 Flow:  Room air CC: f/u Is Patient Diabetic? Yes   Primary Care Provider:  Jacinta Shoe MD  CC:  f/u.  History of Present Illness: The patient presents for a follow up of hypertension, diabetes, hyperlipidemia  Last wk played golf  11-5 in the heat- came home and was talking crazy, going to bathroom all the time, inappropriate w/friends at dinner. Next day felt tired and CBG was 146. No CP, HA F/u HTN, DM. No new meds, no OTC meds ie Benadryl.Marland KitchenMarland KitchenNo ETOH  Preventive Screening-Counseling & Management  Alcohol-Tobacco     Smoking Status: quit  Current Medications (verified): 1)  Actoplus Met 15-850 Mg  Tabs (Pioglitazone Hcl-Metformin Hcl) .... Take 1 Tablet By Mouth Two Times A Day 2)  Meloxicam 15 Mg Tabs (Meloxicam) .... One By Mouth Daily Pc As Needed Arthritis 3)  Adult Aspirin Low Strength 81 Mg  Tbdp (Aspirin) .... Once Daily 4)  Tribenzor 40-10-25 Mg Tabs (Olmesartan-Amlodipine-Hctz) .Marland Kitchen.. 1 By Mouth Qd 5)  Vitamin D3 1000 Unit  Tabs (Cholecalciferol) .Marland Kitchen.. 1 By Mouth Daily 6)  Aspirin 81 Mg Tbec (Aspirin) .Marland Kitchen.. 1 By Mouth Qd 7)  Pennsaid 1.5 % Soln (Diclofenac Sodium) .... 3-5 Gtt On Skin Three Times A Day For Pain 8)  Cialis 20 Mg Tabs (Tadalafil) .Marland Kitchen.. 1 Once Daily As Needed  Allergies (verified): 1)  ! Viagra (Sildenafil Citrate) 2)  Benazepril Hcl (Benazepril Hcl) 3)  Zocor  Past History:  Past Medical History: Last updated: 07/08/2009 Hypertension Low back pain Osteoarthritis ED Diabetes mellitus, type  II Hyperlipidemia blood in stool COPD moder on PFT 2010 due to previous h/o 20y of smoking  Past Surgical History: Last updated: 12/07/2008 Back Surgery Knee Arthroscopy  Family History: Last updated: 12/07/2008 Family History Hypertension No FH of Colon Cancer: Family History of Pancreatic Cancer:father Family History of Diabetes: mom Family History of Heart Disease: mom  Social History: Last updated: 11/08/2009 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         No HA  Physical Exam  General:  alert, well-developed, well-nourished, and cooperative to examination.    Eyes:  vision grossly intact; pupils equal, round and reactive to light.  conjunctiva and lids normal.    Nose:  No deformity, discharge,  or lesions. Mouth:  teeth and gums in good repair; mucous membranes moist, without lesions or ulcers. oropharynx clear without exudate, erythema.  Neck:  No deformities, masses, or tenderness noted. Lungs:  normal respiratory effort, no intercostal retractions or use of accessory muscles; normal breath sounds bilaterally - no crackles and no wheezes.    Heart:  Normal rate and regular  rhythm. S1 and S2 normal without gallop, murmur, click, rub or other extra sounds. Abdomen:  Bowel sounds positive,abdomen soft and non-tender without masses, organomegaly or hernias noted. Msk:  No deformity or scoliosis noted of thoracic or lumbar spine.   Extremities:  No clubbing, cyanosis, edema, or deformity noted with normal full range of motion of all joints.   Neurologic:  No cranial nerve deficits noted.  Plantar reflexes are down-going bilaterally. DTRs are symmetrical throughout. Sensory, motor and coordinative functions appear intact.A little  ataxic gait.   Romberg neg Skin:  Intact without suspicious lesions or rashes Cervical Nodes:  No lymphadenopathy noted Inguinal Nodes:  No significant adenopathy Psych:  Cognition and judgment appear intact. Alert and cooperative with normal attention span and concentration. No apparent delusions, illusions, hallucinations   Impression & Recommendations:  Problem # 1:  MENTAL CONFUSION (ICD-298.9) Assessment New Diff dx is broad: CVA, heat stroke, infection etc Orders: Radiology Referral (Radiology) head CT TLB-B12, Serum-Total ONLY (16109-U04) TLB-BMP (Basic Metabolic Panel-BMET) (80048-METABOL) TLB-CBC Platelet - w/Differential (85025-CBCD) TLB-Hepatic/Liver Function Pnl (80076-HEPATIC) TLB-TSH (Thyroid Stimulating Hormone) (84443-TSH) TLB-Sedimentation Rate (ESR) (85652-ESR) TLB-Udip ONLY (81003-UDIP)  Problem # 2:  HEAT STROKE (ICD-992.0) most likely Assessment: New  Orders: TLB-B12, Serum-Total ONLY (54098-J19) TLB-BMP (Basic Metabolic Panel-BMET) (80048-METABOL) TLB-CBC Platelet - w/Differential (85025-CBCD) TLB-Hepatic/Liver Function Pnl (80076-HEPATIC) TLB-TSH (Thyroid Stimulating Hormone) (84443-TSH) TLB-Sedimentation Rate (ESR) (85652-ESR) TLB-Udip ONLY (81003-UDIP) Radiology Referral (Radiology)  Problem # 3:  HYPERTENSION (ICD-401.9) Assessment: Unchanged  His updated medication list for this problem includes:    Tribenzor 40-10-25 Mg Tabs (Olmesartan-amlodipine-hctz) .Marland Kitchen... 1 by mouth qd  Problem # 4:  COPD (ICD-496) Assessment: Unchanged  Problem # 5:  GAIT DISTURBANCE (ICD-781.2) resolved Assessment: New  Orders: Radiology Referral (Radiology) TLB-B12, Serum-Total ONLY (14782-N56) TLB-BMP (Basic Metabolic Panel-BMET) (80048-METABOL) TLB-CBC Platelet - w/Differential (85025-CBCD) TLB-Hepatic/Liver Function Pnl (80076-HEPATIC) TLB-TSH (Thyroid Stimulating Hormone) (84443-TSH) TLB-Sedimentation Rate (ESR) (85652-ESR) TLB-Udip ONLY (81003-UDIP)  Complete  Medication List: 1)  Actoplus Met 15-850 Mg Tabs (Pioglitazone hcl-metformin hcl) .... Take 1 tablet by mouth two times a day 2)  Meloxicam 15 Mg Tabs (Meloxicam) .... One by mouth daily pc as needed arthritis 3)  Adult Aspirin Low Strength 81 Mg Tbdp (Aspirin) .... Once daily 4)  Tribenzor 40-10-25 Mg Tabs (Olmesartan-amlodipine-hctz) .Marland Kitchen.. 1 by mouth qd 5)  Vitamin D3 1000 Unit Tabs (Cholecalciferol) .Marland Kitchen.. 1 by mouth daily 6)  Aspirin 81 Mg Tbec (Aspirin) .Marland Kitchen.. 1 by mouth qd 7)  Pennsaid 1.5 % Soln (Diclofenac sodium) .... 3-5 gtt on skin three times a day for pain 8)  Cialis 20 Mg Tabs (Tadalafil) .Marland Kitchen.. 1 once daily as needed  Other Orders: TLB-A1C / Hgb A1C (Glycohemoglobin) (83036-A1C)  Patient Instructions: 1)  Please schedule a follow-up appointment in 2 - 3 weeks. 2)  Call if you are not better in a reasonable amount of time or if worse. 3)  Take 3 aspirin 81 mg a day for now

## 2010-10-03 NOTE — Progress Notes (Signed)
Summary: Schedule Colonoscopy  Phone Note Outgoing Call Call back at [facility] Phone [phone]   Call placed by: Harlow Mares CMA [facility]),  Jan 09, 2010 4:06 PM Call placed to: Patient Summary of Call: patients wife states that patient is aware he is due for his colonoscopy and she will advise him he needs to call and make an appt to have his procedure done.  Initial call taken by: Harlow Mares CMA [facility]),  Jan 09, 2010 4:07 PM

## 2010-12-04 LAB — CBC
HCT: 40.1 % (ref 39.0–52.0)
Platelets: 274 10*3/uL (ref 150–400)
RDW: 13.2 % (ref 11.5–15.5)

## 2010-12-04 LAB — CARDIAC PANEL(CRET KIN+CKTOT+MB+TROPI)
CK, MB: 0.9 ng/mL (ref 0.3–4.0)
CK, MB: 1.1 ng/mL (ref 0.3–4.0)
Relative Index: 0.6 (ref 0.0–2.5)
Relative Index: 0.6 (ref 0.0–2.5)
Relative Index: 0.6 (ref 0.0–2.5)
Troponin I: 0.02 ng/mL (ref 0.00–0.06)

## 2010-12-04 LAB — GLUCOSE, CAPILLARY
Glucose-Capillary: 115 mg/dL — ABNORMAL HIGH (ref 70–99)
Glucose-Capillary: 98 mg/dL (ref 70–99)

## 2010-12-04 LAB — LIPID PANEL
Cholesterol: 170 mg/dL (ref 0–200)
LDL Cholesterol: 110 mg/dL — ABNORMAL HIGH (ref 0–99)
Triglycerides: 86 mg/dL (ref <150)
VLDL: 17 mg/dL (ref 0–40)

## 2010-12-04 LAB — BASIC METABOLIC PANEL
BUN: 16 mg/dL (ref 6–23)
Chloride: 103 mEq/L (ref 96–112)
Potassium: 3.5 mEq/L (ref 3.5–5.1)

## 2010-12-04 LAB — COMPREHENSIVE METABOLIC PANEL
AST: 23 U/L (ref 0–37)
Albumin: 3.5 g/dL (ref 3.5–5.2)
Alkaline Phosphatase: 52 U/L (ref 39–117)
Chloride: 105 mEq/L (ref 96–112)
GFR calc Af Amer: >60 mL/min (ref >60)
Potassium: 3.4 mEq/L — ABNORMAL LOW (ref 3.5–5.1)
Total Bilirubin: 1.3 mg/dL — ABNORMAL HIGH (ref 0.3–1.2)

## 2010-12-04 LAB — CULTURE, BLOOD (ROUTINE X 2)

## 2010-12-04 LAB — HEMOGLOBIN A1C: Hgb A1c MFr Bld: 6.1 % (ref 4.6–6.1)

## 2010-12-10 LAB — GLUCOSE, CAPILLARY: Glucose-Capillary: 118 mg/dL — ABNORMAL HIGH (ref 70–99)

## 2011-01-19 NOTE — Assessment & Plan Note (Signed)
[facility]                           PRIMARY CARE OFFICE NOTE   HAIDEN, RAWLINSON                    MRN:          [id number]  DATE:10/23/2006                            DOB:          February 05, 1951    The patient is a 60 year old male who has not been seen here in over 3  years.  Presents to reestablish primary   PAST MEDICAL HISTORY:  As per June 06, 2000 note.   FAMILY HISTORY:  As per June 06, 2000 note.   SOCIAL HISTORY:  As per June 06, 2000 note.   ALLERGIES:  NONE.   CURRENT MEDICATIONS:  1. Atenolol 25 mg 3 tablets daily.  2. Avandamet 10/998 twice a day.  3. Simvastatin 80 mg 1/2 daily.  4. Lisinopril 40 mg daily.  5. Amlodipine 10 mg daily.  6. Hydrochlorothiazide 25 mg daily.   REVIEW OF SYSTEMS:  No chest pain, shortness of breath.  Occasional  tingling in the right hand when typing on the computer.  He really wants  to cut back on the number of medicines he is taking.  He has been seeing  Dr. Hall Busing at the [facility].  The rest of the 18-point system review is  negative.   PHYSICAL EXAMINATION:  Blood pressure 139/82.  Pulse 67.  Temperature  98.6.  Weight 236 pounds.  Looks well.  HEENT:  With moist mucosa.  NECK:  Supple.  No thyromegaly or bruits.  LUNGS:  Clear to auscultation and percussion.  No wheezes or rales.  HEART:  S1 and S2.  No murmur.  No gallop.  ABDOMEN:  Soft and non-tender.  No organomegaly.  LOWER EXTREMITIES:  Without edema.  He is alert and cooperative.  Denies being depressed.  SKIN:  Clear.  LABS:  Colonoscopy on October 09, 2006 clear.   ASSESSMENT AND PLAN:  1. Type 2 diabetes.  Obtain lab work.  Discontinue Avandamet after      discussion.  Start Actos plusMet 15/850, one twice a day.  2. Hypertension.  Discontinue amlodipine and Lotensin.  Start Lotrel      10/40 daily.  3. Fatigue.  Discontinue Atenolol and the hydrochlorothiazide.  Start      Tenoretic 100/25 daily.  4. Dyslipidemia.   On therapy.  Check lipids.  5. Erectile dysfunction.  Viagra p.r.n.  6. Colon polyps.  Do next colonoscopy in 5 years.     Georgina Quint. Plotnikov, MD  Electronically Signed    AVP/MedQ  DD: 10/24/2006  DT: 10/24/2006  Job #: [id number]

## 2011-02-26 ENCOUNTER — Other Ambulatory Visit: Payer: Self-pay | Admitting: Internal Medicine

## 2011-02-26 ENCOUNTER — Other Ambulatory Visit (INDEPENDENT_AMBULATORY_CARE_PROVIDER_SITE_OTHER): Payer: Self-pay

## 2011-02-26 DIAGNOSIS — Z0389 Encounter for observation for other suspected diseases and conditions ruled out: Secondary | ICD-10-CM

## 2011-02-26 DIAGNOSIS — Z0000 Encounter for general adult medical examination without abnormal findings: Secondary | ICD-10-CM

## 2011-02-26 LAB — HEPATIC FUNCTION PANEL
ALT: 19 U/L (ref 0–53)
AST: 20 U/L (ref 0–37)
Bilirubin, Direct: 0.1 mg/dL (ref 0.0–0.3)
Total Bilirubin: 1.1 mg/dL (ref 0.3–1.2)

## 2011-02-26 LAB — BASIC METABOLIC PANEL
BUN: 20 mg/dL (ref 6–23)
Calcium: 8.8 mg/dL (ref 8.4–10.5)
Creatinine, Ser: 1 mg/dL (ref 0.4–1.5)
GFR: 101.04 mL/min (ref >60.00)
Potassium: 3.2 mEq/L — ABNORMAL LOW (ref 3.5–5.1)

## 2011-02-26 LAB — URINALYSIS
Nitrite: NEGATIVE
Specific Gravity, Urine: 1.02 (ref 1.000–1.030)
Total Protein, Urine: NEGATIVE
Urine Glucose: NEGATIVE
pH: 6.5 (ref 5.0–8.0)

## 2011-02-26 LAB — CBC WITH DIFFERENTIAL/PLATELET
Eosinophils Relative: 1.3 % (ref 0.0–5.0)
HCT: 39.9 % (ref 39.0–52.0)
Hemoglobin: 13.7 g/dL (ref 13.0–17.0)
Lymphs Abs: 2 10*3/uL (ref 0.7–4.0)
MCV: 91.4 fl (ref 78.0–100.0)
Monocytes Absolute: 0.4 10*3/uL (ref 0.1–1.0)
Monocytes Relative: 7 % (ref 3.0–12.0)
Neutro Abs: 3.4 10*3/uL (ref 1.4–7.7)
Platelets: 332 10*3/uL (ref 150.0–400.0)
RDW: 12.8 % (ref 11.5–14.6)

## 2011-02-26 LAB — LIPID PANEL: Cholesterol: 197 mg/dL (ref 0–200)

## 2011-02-26 LAB — PSA: PSA: 1.82 ng/mL (ref 0.10–4.00)

## 2011-02-28 ENCOUNTER — Other Ambulatory Visit (INDEPENDENT_AMBULATORY_CARE_PROVIDER_SITE_OTHER): Payer: BC Managed Care – PPO

## 2011-02-28 ENCOUNTER — Ambulatory Visit (INDEPENDENT_AMBULATORY_CARE_PROVIDER_SITE_OTHER): Payer: BC Managed Care – PPO | Admitting: Internal Medicine

## 2011-02-28 ENCOUNTER — Encounter: Payer: Self-pay | Admitting: Internal Medicine

## 2011-02-28 ENCOUNTER — Other Ambulatory Visit: Payer: BC Managed Care – PPO

## 2011-02-28 ENCOUNTER — Ambulatory Visit (INDEPENDENT_AMBULATORY_CARE_PROVIDER_SITE_OTHER)
Admission: RE | Admit: 2011-02-28 | Discharge: 2011-02-28 | Disposition: A | Discharge status: Home / Self Care | Payer: BC Managed Care – PPO | Source: Ambulatory Visit | Attending: Internal Medicine | Admitting: Internal Medicine

## 2011-02-28 VITALS — BP 130/70 | HR 76 | Temp 98.5°F | Resp 16 | Ht 74.0 in | Wt 234.0 lb

## 2011-02-28 DIAGNOSIS — E119 Type 2 diabetes mellitus without complications: Secondary | ICD-10-CM

## 2011-02-28 DIAGNOSIS — E785 Hyperlipidemia, unspecified: Secondary | ICD-10-CM

## 2011-02-28 DIAGNOSIS — M1710 Unilateral primary osteoarthritis, unspecified knee: Secondary | ICD-10-CM

## 2011-02-28 DIAGNOSIS — Z0000 Encounter for general adult medical examination without abnormal findings: Secondary | ICD-10-CM

## 2011-02-28 DIAGNOSIS — I10 Essential (primary) hypertension: Secondary | ICD-10-CM

## 2011-02-28 LAB — URIC ACID: Uric Acid, Serum: 8.1 mg/dL — ABNORMAL HIGH (ref 4.0–7.8)

## 2011-02-28 MED ORDER — MELOXICAM 15 MG PO TABS: 15.0000 mg | ORAL_TABLET | Freq: Every day | ORAL | Status: DC | PRN

## 2011-02-28 MED ORDER — AMOXICILLIN 500 MG PO CAPS: 1000.0000 mg | ORAL_CAPSULE | Freq: Two times a day (BID) | ORAL | Status: AC

## 2011-02-28 MED ORDER — PREDNISONE 10 MG PO TABS: ORAL_TABLET | ORAL | Status: DC

## 2011-02-28 MED ORDER — TRAMADOL HCL 50 MG PO TABS: 50.0000 mg | ORAL_TABLET | Freq: Two times a day (BID) | ORAL | Status: AC | PRN

## 2011-02-28 NOTE — Progress Notes (Signed)
Subjective:    Patient ID: [name] [name], male    DOB: 1950-11-04, 60 y.o.   MRN: [id number]  HPI  The patient is here for a wellness exam. The patient has been doing well overall without major physical or psychological issues going on lately. The patient needs to address  chronic hypertension that has been well controlled with medicines; to address chronic  hyperlipidemia controlled with medicines as well; and to address type 2 chronic diabetes, controlled with medical treatment and diet.  C/O B knees pain  X 2 wks ago swollen and tight. Motrin did not help: pain is 8-10 Stiff in am x 10 min. Restless. Wt Readings from Last 3 Encounters:  02/28/11 234 lb (106.142 kg)  03/09/10 236 lb (107.049 kg)  11/08/09 234 lb (106.142 kg)      Review of Systems  Constitutional: Positive for fatigue. Negative for appetite change and unexpected weight change.  HENT: Negative for nosebleeds, congestion, sore throat, sneezing, trouble swallowing and neck pain.   Eyes: Negative for itching and visual disturbance.  Respiratory: Negative for cough.   Cardiovascular: Negative for chest pain, palpitations and leg swelling.  Gastrointestinal: Negative for nausea, diarrhea, blood in stool and abdominal distention.  Genitourinary: Negative for frequency and hematuria.  Musculoskeletal: Positive for back pain, joint swelling (B knees), arthralgias and gait problem. Negative for myalgias.  Skin: Negative for rash.  Neurological: Negative for dizziness, tremors, speech difficulty and weakness.  Psychiatric/Behavioral: Negative for suicidal ideas, sleep disturbance, dysphoric mood and agitation. The patient is not nervous/anxious.        Objective:   Physical Exam  Constitutional: He is oriented to person, place, and time. He appears well-developed and well-nourished. No distress.  HENT:  Head: Normocephalic and atraumatic.  Right Ear: External ear normal.  Left Ear: External ear normal.  Nose: Nose  normal.  Mouth/Throat: Oropharynx is clear and moist. No oropharyngeal exudate.  Eyes: Conjunctivae and EOM are normal. Pupils are equal, round, and reactive to light. Right eye exhibits no discharge. Left eye exhibits no discharge. No scleral icterus.  Neck: Normal range of motion. Neck supple. No JVD present. No tracheal deviation present. No thyromegaly present.  Cardiovascular: Normal rate, regular rhythm, normal heart sounds and intact distal pulses.  Exam reveals no gallop and no friction rub.   No murmur heard. Pulmonary/Chest: Effort normal and breath sounds normal. No stridor. No respiratory distress. He has no wheezes. He has no rales. He exhibits no tenderness.  Abdominal: Soft. Bowel sounds are normal. He exhibits no distension and no mass. There is no tenderness. There is no rebound and no guarding.  Genitourinary: Guaiac negative stool. No penile tenderness.       Will do next time  Musculoskeletal: Normal range of motion. He exhibits edema and tenderness.       B knees are swollen and stiff and tired  Lymphadenopathy:    He has no cervical adenopathy.  Neurological: He is alert and oriented to person, place, and time. He has normal reflexes. No cranial nerve deficit. He exhibits normal muscle tone. Coordination normal.  Skin: Skin is warm and dry. No rash noted. He is not diaphoretic. No erythema. No pallor.  Psychiatric: He has a normal mood and affect. His behavior is normal. Judgment and thought content normal.       Lab Results  Component Value Date   WBC 5.9 02/26/2011   HGB 13.7 02/26/2011   HCT 39.9 02/26/2011   PLT 332.0 02/26/2011  CHOL 197 02/26/2011   TRIG 69.0 02/26/2011   HDL 44.40 02/26/2011   LDLDIRECT 125.4 07/14/2008   ALT 19 02/26/2011   AST 20 02/26/2011   NA 139 02/26/2011   K 3.2* 02/26/2011   CL 101 02/26/2011   CREATININE 1.0 02/26/2011   BUN 20 02/26/2011   CO2 32 02/26/2011   TSH 1.44 02/26/2011   PSA 1.82 02/26/2011   HGBA1C 6.1 03/09/2010       Assessment & Plan:   A complex case

## 2011-03-01 LAB — RHEUMATOID FACTOR: Rhuematoid fact SerPl-aCnc: <10 IU/mL (ref <=14)

## 2011-03-01 LAB — VITAMIN D 25 HYDROXY (VIT D DEFICIENCY, FRACTURES): Vit D, 25-Hydroxy: 39 ng/mL (ref 30–89)

## 2011-03-03 ENCOUNTER — Encounter: Payer: Self-pay | Admitting: Internal Medicine

## 2011-03-03 DIAGNOSIS — M1710 Unilateral primary osteoarthritis, unspecified knee: Secondary | ICD-10-CM | POA: Insufficient documentation

## 2011-03-03 DIAGNOSIS — Z0000 Encounter for general adult medical examination without abnormal findings: Secondary | ICD-10-CM | POA: Insufficient documentation

## 2011-03-03 NOTE — Assessment & Plan Note (Signed)
On Rx 

## 2011-03-03 NOTE — Assessment & Plan Note (Signed)
We discussed age appropriate health related issues, including available/recomended screening tests and vaccinations. We discussed a need for adhering to healthy diet and exercise. Labs/EKG were reviewed/ordered. All questions were answered.   

## 2011-03-03 NOTE — Assessment & Plan Note (Signed)
Obtain labs See Meds  Potential benefits of a short/long term steroid  use as well as potential risks  and complications were explained to the patient and were aknowledged.

## 2011-03-13 ENCOUNTER — Encounter: Payer: Self-pay | Admitting: Internal Medicine

## 2011-03-13 ENCOUNTER — Ambulatory Visit (INDEPENDENT_AMBULATORY_CARE_PROVIDER_SITE_OTHER): Payer: BC Managed Care – PPO | Admitting: Internal Medicine

## 2011-03-13 DIAGNOSIS — M109 Gout, unspecified: Secondary | ICD-10-CM | POA: Insufficient documentation

## 2011-03-13 DIAGNOSIS — I10 Essential (primary) hypertension: Secondary | ICD-10-CM

## 2011-03-13 DIAGNOSIS — M1710 Unilateral primary osteoarthritis, unspecified knee: Secondary | ICD-10-CM

## 2011-03-13 MED ORDER — DICLOFENAC SODIUM 1.5 % TD SOLN: 3.0000 [drp] | Freq: Three times a day (TID) | TRANSDERMAL | Status: DC | PRN

## 2011-03-13 MED ORDER — AMLODIPINE-OLMESARTAN 10-40 MG PO TABS: 1.0000 | ORAL_TABLET | Freq: Every day | ORAL | Status: DC

## 2011-03-13 NOTE — Assessment & Plan Note (Signed)
D/c HCTZ Cont Mobic 50% better after Prednisone

## 2011-03-13 NOTE — Progress Notes (Signed)
Subjective:    Patient ID: [name] [name], male    DOB: 1951-05-09, 60 y.o.   MRN: [id number]  HPI  F/u HTN,labs F/u B knee pain and swelling - 50% better Subjective:    Patient ID: [name] [name], male    DOB: 1951/05/06, 60 y.o.   MRN: [id number]      Review of Systems  Constitutional: Positive forless  fatigue. Negative for appetite change and unexpected weight change.  HENT: Negative for nosebleeds, congestion, sore throat, sneezing, trouble swallowing and neck pain.   Eyes: Negative for itching and visual disturbance.  Respiratory: Negative for cough.   Cardiovascular: Negative for chest pain, palpitations and leg swelling.  Gastrointestinal: Negative for nausea, diarrhea, blood in stool and abdominal distention.  Genitourinary: Negative for frequency and hematuria.  Musculoskeletal: Positive for less back pain, less  joint swelling (B knees), less arthralgias and less gait problem. Negative for myalgias.  Skin: Negative for rash.  Neurological: Negative for dizziness, tremors, speech difficulty and weakness.  Psychiatric/Behavioral: Negative for suicidal ideas, sleep disturbance, dysphoric mood and agitation. The patient is not nervous/anxious.        Objective:   Physical Exam  Constitutional: He is oriented to person, place, and time. He appears well-developed and well-nourished. No distress.  HENT:  Head: Normocephalic and atraumatic.  Right Ear: External ear normal.  Left Ear: External ear normal.  Nose: Nose normal.  Mouth/Throat: Oropharynx is clear and moist. No oropharyngeal exudate.  Eyes: Conjunctivae and EOM are normal. Pupils are equal, round, and reactive to light. Right eye exhibits no discharge. Left eye exhibits no discharge. No scleral icterus.  Neck: Normal range of motion. Neck supple. No JVD present. No tracheal deviation present. No thyromegaly present.  Cardiovascular: Normal rate, regular rhythm, normal heart sounds and intact distal pulses.   Exam reveals no gallop and no friction rub.   No murmur heard. Pulmonary/Chest: Effort normal and breath sounds normal. No stridor. No respiratory distress. He has no wheezes. He has no rales. He exhibits no tenderness.  Abdominal: Soft. Bowel sounds are normal. He exhibits no distension and no mass. There is no tenderness. There is no rebound and no guarding.  Musculoskeletal: Normal range of motion. He exhibits less edema and less tenderness in B knees.       B knees are swollen and stiff and tired  Lymphadenopathy:    He has no cervical adenopathy.  Neurological: He is alert and oriented to person, place, and time. He has normal reflexes. No cranial nerve deficit. He exhibits normal muscle tone. Coordination normal.  Skin: Skin is warm and dry. No rash noted. He is not diaphoretic. No erythema. No pallor.  Psychiatric: He has a normal mood and affect. His behavior is normal. Judgment and thought content normal.       Lab Results      Assessment & Plan:      Review of Systems     Objective:   Physical Exam        Assessment & Plan:   Lab Results  Component Value Date   WBC 5.9 02/26/2011   HGB 13.7 02/26/2011   HCT 39.9 02/26/2011   PLT 332.0 02/26/2011   CHOL 197 02/26/2011   TRIG 69.0 02/26/2011   HDL 44.40 02/26/2011   LDLDIRECT 125.4 07/14/2008   ALT 19 02/26/2011   AST 20 02/26/2011   NA 139 02/26/2011   K 3.2* 02/26/2011   CL 101 02/26/2011   CREATININE 1.0 02/26/2011  BUN 20 02/26/2011   CO2 32 02/26/2011   TSH 1.44 02/26/2011   PSA 1.82 02/26/2011   HGBA1C 6.1 03/09/2010

## 2011-03-13 NOTE — Assessment & Plan Note (Signed)
[facility] in place of [employer]

## 2011-03-13 NOTE — Assessment & Plan Note (Signed)
Remove HCTZ Cont rx

## 2011-03-15 ENCOUNTER — Other Ambulatory Visit: Payer: Self-pay | Admitting: *Deleted

## 2011-03-15 MED ORDER — DICLOFENAC SODIUM 1 % TD GEL: 1.0000 "application " | Freq: Four times a day (QID) | TRANSDERMAL | Status: DC

## 2011-04-23 ENCOUNTER — Other Ambulatory Visit: Payer: Self-pay | Admitting: *Deleted

## 2011-04-23 MED ORDER — TADALAFIL 20 MG PO TABS: 20.0000 mg | ORAL_TABLET | Freq: Every day | ORAL | Status: DC | PRN

## 2011-05-17 ENCOUNTER — Ambulatory Visit: Payer: BC Managed Care – PPO | Admitting: Internal Medicine

## 2011-07-06 ENCOUNTER — Ambulatory Visit (INDEPENDENT_AMBULATORY_CARE_PROVIDER_SITE_OTHER): Payer: BC Managed Care – PPO | Admitting: Internal Medicine

## 2011-07-06 ENCOUNTER — Encounter: Payer: Self-pay | Admitting: Internal Medicine

## 2011-07-06 ENCOUNTER — Ambulatory Visit (INDEPENDENT_AMBULATORY_CARE_PROVIDER_SITE_OTHER)
Admission: RE | Admit: 2011-07-06 | Discharge: 2011-07-06 | Disposition: A | Discharge status: Home / Self Care | Payer: BC Managed Care – PPO | Source: Ambulatory Visit | Attending: Internal Medicine | Admitting: Internal Medicine

## 2011-07-06 VITALS — BP 140/98 | HR 84 | Temp 98.4°F | Resp 16 | Wt 246.0 lb

## 2011-07-06 DIAGNOSIS — R209 Unspecified disturbances of skin sensation: Secondary | ICD-10-CM

## 2011-07-06 DIAGNOSIS — E119 Type 2 diabetes mellitus without complications: Secondary | ICD-10-CM

## 2011-07-06 DIAGNOSIS — M25519 Pain in unspecified shoulder: Secondary | ICD-10-CM

## 2011-07-06 DIAGNOSIS — R202 Paresthesia of skin: Secondary | ICD-10-CM

## 2011-07-06 MED ORDER — PREDNISONE 10 MG PO TABS: ORAL_TABLET | ORAL | Status: DC

## 2011-07-06 MED ORDER — TRAMADOL HCL 50 MG PO TABS: 50.0000 mg | ORAL_TABLET | Freq: Two times a day (BID) | ORAL | Status: AC | PRN

## 2011-07-06 NOTE — Assessment & Plan Note (Signed)
Continue with current prescription therapy as reflected on the Med list.  

## 2011-07-06 NOTE — Progress Notes (Signed)
  Subjective:    Patient ID: [name] [name], male    DOB: 12/19/1950, 60 y.o.   MRN: [id number]  HPI  The patient presents for a follow-up of  chronic hypertension, chronic dyslipidemia, type 2 diabetes controlled with medicines  C/o L arm and L elbow, hand pain and mostly numbness x 1 mo C/o L heel pain  Review of Systems  Constitutional: Negative for appetite change, fatigue and unexpected weight change.  HENT: Negative for nosebleeds, congestion, sore throat, sneezing, trouble swallowing and neck pain.   Eyes: Negative for itching and visual disturbance.  Respiratory: Negative for cough.   Cardiovascular: Negative for chest pain, palpitations and leg swelling.  Gastrointestinal: Negative for nausea, diarrhea, blood in stool and abdominal distention.  Genitourinary: Negative for frequency and hematuria.  Musculoskeletal: Positive for joint swelling (Knees, L heel) and gait problem. Negative for back pain.  Skin: Negative for rash.  Neurological: Negative for dizziness, tremors, speech difficulty and weakness.  Psychiatric/Behavioral: Negative for sleep disturbance, dysphoric mood and agitation. The patient is not nervous/anxious.        Objective:   Physical Exam  Constitutional: He is oriented to person, place, and time. He appears well-developed.  HENT:  Mouth/Throat: Oropharynx is clear and moist.  Eyes: Conjunctivae are normal. Pupils are equal, round, and reactive to light.  Neck: Normal range of motion. No JVD present. No thyromegaly present.  Cardiovascular: Normal rate, regular rhythm, normal heart sounds and intact distal pulses.  Exam reveals no gallop and no friction rub.   No murmur heard. Pulmonary/Chest: Effort normal and breath sounds normal. No respiratory distress. He has no wheezes. He has no rales. He exhibits no tenderness.  Abdominal: Soft. Bowel sounds are normal. He exhibits no distension and no mass. There is no tenderness. There is no rebound and no  guarding.  Musculoskeletal: Normal range of motion. He exhibits tenderness (L shoulder, L trap, L elbow is tender). He exhibits no edema.  Lymphadenopathy:    He has no cervical adenopathy.  Neurological: He is alert and oriented to person, place, and time. He has normal reflexes. No cranial nerve deficit. He exhibits normal muscle tone. Coordination normal.  Skin: Skin is warm and dry. No rash noted.  Psychiatric: He has a normal mood and affect. His behavior is normal. Judgment and thought content normal.  DTRs ok MS is ok        Assessment & Plan:

## 2011-07-06 NOTE — Assessment & Plan Note (Addendum)
Check labs C spine x ray Prednisone 10 mg: take 4 tabs a day x 3 days; then 3 tabs a day x 4 days; then 2 tabs a day x 4 days, then 1 tab a day x 6 days, then stop. Take pc.  Potential benefits of a short term steroid  use as well as potential risks  and complications were explained to the patient and were aknowledged.

## 2011-07-06 NOTE — Assessment & Plan Note (Signed)
May need to inject

## 2011-07-08 ENCOUNTER — Telehealth: Payer: Self-pay | Admitting: Internal Medicine

## 2011-07-08 NOTE — Telephone Encounter (Signed)
[name] [name], please, inform patient that his c spine oa is pos for OA Thx

## 2011-07-09 NOTE — Telephone Encounter (Signed)
Left mess for patient to call back.  

## 2011-07-09 NOTE — Telephone Encounter (Signed)
Pts wife informed.

## 2011-08-14 ENCOUNTER — Ambulatory Visit: Payer: Self-pay

## 2011-08-23 ENCOUNTER — Other Ambulatory Visit: Payer: Self-pay | Admitting: *Deleted

## 2011-08-23 MED ORDER — PIOGLITAZONE HCL-METFORMIN HCL 15-850 MG PO TABS: 1.0000 | ORAL_TABLET | Freq: Two times a day (BID) | ORAL | Status: DC

## 2011-09-07 ENCOUNTER — Encounter: Payer: Self-pay | Admitting: Internal Medicine

## 2011-09-07 ENCOUNTER — Ambulatory Visit (INDEPENDENT_AMBULATORY_CARE_PROVIDER_SITE_OTHER): Payer: BC Managed Care – PPO | Admitting: Internal Medicine

## 2011-09-07 VITALS — BP 170/68 | HR 92 | Temp 98.7°F | Resp 16 | Wt 252.0 lb

## 2011-09-07 DIAGNOSIS — I10 Essential (primary) hypertension: Secondary | ICD-10-CM

## 2011-09-07 DIAGNOSIS — R0683 Snoring: Secondary | ICD-10-CM

## 2011-09-07 DIAGNOSIS — M79609 Pain in unspecified limb: Secondary | ICD-10-CM

## 2011-09-07 DIAGNOSIS — E119 Type 2 diabetes mellitus without complications: Secondary | ICD-10-CM

## 2011-09-07 DIAGNOSIS — R0609 Other forms of dyspnea: Secondary | ICD-10-CM

## 2011-09-07 MED ORDER — AMOXICILLIN 500 MG PO CAPS: 1000.0000 mg | ORAL_CAPSULE | Freq: Two times a day (BID) | ORAL | Status: DC

## 2011-09-07 MED ORDER — CARVEDILOL 25 MG PO TABS: 25.0000 mg | ORAL_TABLET | Freq: Two times a day (BID) | ORAL | Status: DC

## 2011-09-07 NOTE — Assessment & Plan Note (Signed)
Continue with current prescription therapy as reflected on the Med list.  

## 2011-09-07 NOTE — Progress Notes (Signed)
Patient ID: [name] [name], male   DOB: [birth year], 61 y.o.   MRN: [id number]  Subjective:    Patient ID: [name] [name], male    DOB: [birth year], 61 y.o.   MRN: [id number]  HPI  The patient presents for a follow-up of  chronic hypertension, chronic dyslipidemia, type 2 diabetes controlled with medicines  C/o L arm and L elbow, hand pain and mostly numbness x 1 mo C/o L heel pain  Review of Systems  Constitutional: Negative for appetite change, fatigue and unexpected weight change.  HENT: Negative for nosebleeds, congestion, sore throat, sneezing, trouble swallowing and neck pain.   Eyes: Negative for itching and visual disturbance.  Respiratory: Negative for cough.   Cardiovascular: Negative for chest pain, palpitations and leg swelling.  Gastrointestinal: Negative for nausea, diarrhea, blood in stool and abdominal distention.  Genitourinary: Negative for frequency and hematuria.  Musculoskeletal: Positive for joint swelling (Knees, L heel) and gait problem. Negative for back pain.  Skin: Negative for rash.  Neurological: Negative for dizziness, tremors, speech difficulty and weakness.  Psychiatric/Behavioral: Negative for sleep disturbance, dysphoric mood and agitation. The patient is not nervous/anxious.        Objective:   Physical Exam  Constitutional: He is oriented to person, place, and time. He appears well-developed.  HENT:  Mouth/Throat: Oropharynx is clear and moist.  Eyes: Conjunctivae are normal. Pupils are equal, round, and reactive to light.  Neck: Normal range of motion. No JVD present. No thyromegaly present.  Cardiovascular: Normal rate, regular rhythm, normal heart sounds and intact distal pulses.  Exam reveals no gallop and no friction rub.   No murmur heard. Pulmonary/Chest: Effort normal and breath sounds normal. No respiratory distress. He has no wheezes. He has no rales. He exhibits no tenderness.  Abdominal: Soft. Bowel sounds are normal. He exhibits  no distension and no mass. There is no tenderness. There is no rebound and no guarding.  Musculoskeletal: Normal range of motion. He exhibits tenderness (L shoulder, L trap, L elbow is tender). He exhibits no edema.  Lymphadenopathy:    He has no cervical adenopathy.  Neurological: He is alert and oriented to person, place, and time. He has normal reflexes. No cranial nerve deficit. He exhibits normal muscle tone. Coordination normal.  Skin: Skin is warm and dry. No rash noted.  Psychiatric: He has a normal mood and affect. His behavior is normal. Judgment and thought content normal.  DTRs ok MS is ok   Procedure Note :     Procedure : L heel injection   Indication:  Trochanteric bursitis with refractory  chronic pain.   Risks including unsuccessful procedure , bleeding, infection, bruising, skin atrophy and others were explained to the patient in detail as well as the benefits. Informed consent was obtained and signed.   Tthe patient was placed in a comfortable lateral decubitus position. The point of maximal tenderness was identified. Skin was prepped with Betadine and alcohol. Then, a 5 cc syringe with a 2 inch long 24-gauge needle was used for a bursa injection.. The needle was advanced  Into the bursa. I injected the bursa with 4 mL of 2% lidocaine and 40 mg of Depo-Medrol .  Band-Aid was applied.   Tolerated well. Complications: None. Good pain relief following the procedure.   Postprocedure instructions :    A Band-Aid should be left on for 12 hours. Injection therapy is not a cure itself. It is used in conjunction with other modalities. You can use nonsteroidal  anti-inflammatories like ibuprofen , hot and cold compresses. Rest is recommended in the next 24 hours. You need to report immediately  if fever, chills or any signs of infection develop.   Procedure Note :    Trigger Point Injection: L medial heel   Indication : Focal tender area identifiable by the location without other  identifiable neurologic or musculoskeletal finding or pathology.   Risks including unsuccessful procedure , bleeding, infection, bruising, skin atrophy and others were explained to the patient in detail as well as the benefits. Informed consent was obtained and signed.   Tthe patient was placed in a comfortable position.    points of maximum tenderness over paraspinal and trapezius muscles were marked and  the skin was prepped with Betadine and alcohol. 1 inch 25-gauge needle was used. The needle was advanced perpendicular to the skin. Each trigger point was injected with 1 mL of 2% lidocaine and 10 mg of Depo-Medrol in a usual fashion.  Band-Aids applied.   Tolerated well. Complications: None. Good pain relief following the procedure.         Assessment & Plan:

## 2011-09-07 NOTE — Assessment & Plan Note (Signed)
Add Coreg

## 2011-09-07 NOTE — Patient Instructions (Signed)
Postprocedure instructions :    A Band-Aid should be left on for 12 hours. Injection therapy is not a cure itself. It is used in conjunction with other modalities. You can use nonsteroidal anti-inflammatories like ibuprofen , hot and cold compresses. Rest is recommended in the next 24 hours. You need to report immediately  if fever, chills or any signs of infection develop. 

## 2011-09-07 NOTE — Assessment & Plan Note (Signed)
Sleep test 

## 2011-09-07 NOTE — Assessment & Plan Note (Signed)
Will inject °

## 2011-10-07 ENCOUNTER — Encounter (HOSPITAL_BASED_OUTPATIENT_CLINIC_OR_DEPARTMENT_OTHER): Payer: BC Managed Care – PPO

## 2011-10-17 ENCOUNTER — Ambulatory Visit: Payer: BC Managed Care – PPO | Admitting: Internal Medicine

## 2011-10-30 ENCOUNTER — Telehealth: Payer: Self-pay | Admitting: *Deleted

## 2011-10-30 NOTE — Telephone Encounter (Signed)
Rf req for Amoxicillin 500 mg  Take 2 po bid # 40. Ok to Rf?

## 2011-10-30 NOTE — Telephone Encounter (Signed)
OK to fill this prescription with additional refills x0 OV if sick Thank you!  

## 2011-10-31 MED ORDER — AMOXICILLIN 500 MG PO CAPS: 1000.0000 mg | ORAL_CAPSULE | Freq: Two times a day (BID) | ORAL | Status: AC

## 2011-10-31 NOTE — Telephone Encounter (Signed)
RX sent in

## 2012-03-31 ENCOUNTER — Other Ambulatory Visit: Payer: Self-pay | Admitting: *Deleted

## 2012-03-31 MED ORDER — AMLODIPINE-OLMESARTAN 10-40 MG PO TABS: 1.0000 | ORAL_TABLET | Freq: Every day | ORAL | Status: DC

## 2012-06-03 HISTORY — PX: TOTAL KNEE ARTHROPLASTY: SHX125

## 2012-07-29 ENCOUNTER — Ambulatory Visit: Payer: BC Managed Care – PPO | Attending: Orthopaedic Surgery | Admitting: Physical Therapy

## 2012-07-29 DIAGNOSIS — R269 Unspecified abnormalities of gait and mobility: Secondary | ICD-10-CM | POA: Insufficient documentation

## 2012-07-29 DIAGNOSIS — M25669 Stiffness of unspecified knee, not elsewhere classified: Secondary | ICD-10-CM | POA: Insufficient documentation

## 2012-07-29 DIAGNOSIS — IMO0001 Reserved for inherently not codable concepts without codable children: Secondary | ICD-10-CM | POA: Insufficient documentation

## 2012-07-29 DIAGNOSIS — M25569 Pain in unspecified knee: Secondary | ICD-10-CM | POA: Insufficient documentation

## 2012-08-05 ENCOUNTER — Ambulatory Visit: Payer: Non-veteran care | Attending: Orthopaedic Surgery | Admitting: Physical Therapy

## 2012-08-05 DIAGNOSIS — IMO0001 Reserved for inherently not codable concepts without codable children: Secondary | ICD-10-CM | POA: Insufficient documentation

## 2012-08-05 DIAGNOSIS — M25569 Pain in unspecified knee: Secondary | ICD-10-CM | POA: Insufficient documentation

## 2012-08-05 DIAGNOSIS — M25669 Stiffness of unspecified knee, not elsewhere classified: Secondary | ICD-10-CM | POA: Insufficient documentation

## 2012-08-05 DIAGNOSIS — R269 Unspecified abnormalities of gait and mobility: Secondary | ICD-10-CM | POA: Insufficient documentation

## 2012-08-06 ENCOUNTER — Other Ambulatory Visit: Payer: Self-pay

## 2012-08-06 MED ORDER — PIOGLITAZONE HCL-METFORMIN HCL 15-850 MG PO TABS: 1.0000 | ORAL_TABLET | Freq: Two times a day (BID) | ORAL | Status: DC

## 2012-08-07 ENCOUNTER — Ambulatory Visit: Payer: Non-veteran care | Admitting: Physical Therapy

## 2012-08-11 ENCOUNTER — Ambulatory Visit: Payer: Non-veteran care | Admitting: Physical Therapy

## 2012-08-14 ENCOUNTER — Ambulatory Visit: Payer: Non-veteran care | Admitting: Rehabilitation

## 2012-08-18 ENCOUNTER — Ambulatory Visit: Payer: Non-veteran care | Admitting: Physical Therapy

## 2012-08-21 ENCOUNTER — Ambulatory Visit: Payer: Non-veteran care | Admitting: Physical Therapy

## 2012-08-28 ENCOUNTER — Ambulatory Visit: Payer: Non-veteran care | Admitting: Physical Therapy

## 2012-09-01 ENCOUNTER — Encounter: Payer: BC Managed Care – PPO | Admitting: Physical Therapy

## 2012-09-02 ENCOUNTER — Ambulatory Visit: Payer: BC Managed Care – PPO | Admitting: Internal Medicine

## 2012-09-04 ENCOUNTER — Ambulatory Visit: Payer: Non-veteran care | Attending: Orthopaedic Surgery | Admitting: Physical Therapy

## 2012-09-04 DIAGNOSIS — R269 Unspecified abnormalities of gait and mobility: Secondary | ICD-10-CM | POA: Insufficient documentation

## 2012-09-04 DIAGNOSIS — M25569 Pain in unspecified knee: Secondary | ICD-10-CM | POA: Insufficient documentation

## 2012-09-04 DIAGNOSIS — IMO0001 Reserved for inherently not codable concepts without codable children: Secondary | ICD-10-CM | POA: Insufficient documentation

## 2012-09-04 DIAGNOSIS — M25669 Stiffness of unspecified knee, not elsewhere classified: Secondary | ICD-10-CM | POA: Insufficient documentation

## 2012-09-05 ENCOUNTER — Encounter: Payer: Self-pay | Admitting: Internal Medicine

## 2012-09-05 ENCOUNTER — Ambulatory Visit (INDEPENDENT_AMBULATORY_CARE_PROVIDER_SITE_OTHER): Payer: BC Managed Care – PPO | Admitting: Internal Medicine

## 2012-09-05 VITALS — BP 140/76 | HR 84 | Temp 99.3°F | Resp 16 | Wt 240.0 lb

## 2012-09-05 DIAGNOSIS — R0683 Snoring: Secondary | ICD-10-CM

## 2012-09-05 DIAGNOSIS — R310 Gross hematuria: Secondary | ICD-10-CM

## 2012-09-05 DIAGNOSIS — Z9989 Dependence on other enabling machines and devices: Secondary | ICD-10-CM | POA: Insufficient documentation

## 2012-09-05 DIAGNOSIS — E119 Type 2 diabetes mellitus without complications: Secondary | ICD-10-CM

## 2012-09-05 DIAGNOSIS — R0609 Other forms of dyspnea: Secondary | ICD-10-CM

## 2012-09-05 DIAGNOSIS — M179 Osteoarthritis of knee, unspecified: Secondary | ICD-10-CM | POA: Insufficient documentation

## 2012-09-05 DIAGNOSIS — G4733 Obstructive sleep apnea (adult) (pediatric): Secondary | ICD-10-CM

## 2012-09-05 DIAGNOSIS — M1710 Unilateral primary osteoarthritis, unspecified knee: Secondary | ICD-10-CM

## 2012-09-05 DIAGNOSIS — I10 Essential (primary) hypertension: Secondary | ICD-10-CM

## 2012-09-05 MED ORDER — PIOGLITAZONE HCL-METFORMIN HCL 15-850 MG PO TABS: 1.0000 | ORAL_TABLET | Freq: Two times a day (BID) | ORAL | Status: DC

## 2012-09-05 MED ORDER — IBUPROFEN 600 MG PO TABS: ORAL_TABLET | ORAL | Status: DC

## 2012-09-05 NOTE — Assessment & Plan Note (Signed)
CPAP via [place]

## 2012-09-05 NOTE — Assessment & Plan Note (Signed)
In PT Tramadol

## 2012-09-05 NOTE — Assessment & Plan Note (Signed)
Lab at the [facility]

## 2012-09-05 NOTE — Assessment & Plan Note (Signed)
Urol ref 

## 2012-09-05 NOTE — Progress Notes (Signed)
   Subjective:    Patient ID: [name] [name], male    DOB: 04/12/1951, 62 y.o.   MRN: [id number]  HPI  C/o nocturia C/o blood in urine  The patient presents for a follow-up of  chronic hypertension, chronic dyslipidemia, type 2 diabetes controlled with medicines  He is s/p L TKR 10/13  Review of Systems  Constitutional: Negative for appetite change, fatigue and unexpected weight change.  HENT: Negative for nosebleeds, congestion, sore throat, sneezing, trouble swallowing and neck pain.   Eyes: Negative for itching and visual disturbance.  Respiratory: Negative for cough.   Cardiovascular: Negative for chest pain, palpitations and leg swelling.  Gastrointestinal: Negative for nausea, diarrhea, blood in stool and abdominal distention.  Genitourinary: Negative for frequency and hematuria.  Musculoskeletal: Positive for joint swelling (Knees, L heel) and gait problem. Negative for back pain.  Skin: Negative for rash.  Neurological: Negative for dizziness, tremors, speech difficulty and weakness.  Psychiatric/Behavioral: Negative for sleep disturbance, dysphoric mood and agitation. The patient is not nervous/anxious.        Objective:   Physical Exam  Constitutional: He is oriented to person, place, and time. He appears well-developed.  HENT:  Mouth/Throat: Oropharynx is clear and moist.  Eyes: Conjunctivae normal are normal. Pupils are equal, round, and reactive to light.  Neck: Normal range of motion. No JVD present. No thyromegaly present.  Cardiovascular: Normal rate, regular rhythm, normal heart sounds and intact distal pulses.  Exam reveals no gallop and no friction rub.   No murmur heard. Pulmonary/Chest: Effort normal and breath sounds normal. No respiratory distress. He has no wheezes. He has no rales. He exhibits no tenderness.  Abdominal: Soft. Bowel sounds are normal. He exhibits no distension and no mass. There is no tenderness. There is no rebound and no guarding.    Musculoskeletal: Normal range of motion. He exhibits tenderness (L shoulder, L trap, L elbow is tender). He exhibits no edema.  Lymphadenopathy:    He has no cervical adenopathy.  Neurological: He is alert and oriented to person, place, and time. He has normal reflexes. No cranial nerve deficit. He exhibits normal muscle tone. Coordination normal.  Skin: Skin is warm and dry. No rash noted.  Psychiatric: He has a normal mood and affect. His behavior is normal. Judgment and thought content normal.   DTRs ok MS is ok Cane. L knee swollen a little     Assessment & Plan:

## 2012-09-05 NOTE — Assessment & Plan Note (Signed)
OSA on CPAP ?

## 2012-09-05 NOTE — Assessment & Plan Note (Signed)
Continue with current prescription therapy as reflected on the Med list.  

## 2012-09-11 ENCOUNTER — Other Ambulatory Visit (INDEPENDENT_AMBULATORY_CARE_PROVIDER_SITE_OTHER): Payer: BC Managed Care – PPO

## 2012-09-11 DIAGNOSIS — M179 Osteoarthritis of knee, unspecified: Secondary | ICD-10-CM

## 2012-09-11 DIAGNOSIS — R310 Gross hematuria: Secondary | ICD-10-CM

## 2012-09-11 DIAGNOSIS — R0989 Other specified symptoms and signs involving the circulatory and respiratory systems: Secondary | ICD-10-CM

## 2012-09-11 DIAGNOSIS — M1710 Unilateral primary osteoarthritis, unspecified knee: Secondary | ICD-10-CM

## 2012-09-11 DIAGNOSIS — E119 Type 2 diabetes mellitus without complications: Secondary | ICD-10-CM

## 2012-09-11 DIAGNOSIS — G4733 Obstructive sleep apnea (adult) (pediatric): Secondary | ICD-10-CM

## 2012-09-11 DIAGNOSIS — I10 Essential (primary) hypertension: Secondary | ICD-10-CM

## 2012-09-11 DIAGNOSIS — Z9989 Dependence on other enabling machines and devices: Secondary | ICD-10-CM

## 2012-09-11 DIAGNOSIS — R0683 Snoring: Secondary | ICD-10-CM

## 2012-09-11 LAB — BASIC METABOLIC PANEL
Calcium: 9.1 mg/dL (ref 8.4–10.5)
Creatinine, Ser: 0.8 mg/dL (ref 0.4–1.5)
GFR: 123.78 mL/min (ref >60.00)
Sodium: 140 mEq/L (ref 135–145)

## 2012-09-11 LAB — HEMOGLOBIN A1C: Hgb A1c MFr Bld: 6.3 % (ref 4.6–6.5)

## 2012-09-11 LAB — HEPATIC FUNCTION PANEL
ALT: 16 U/L (ref 0–53)
AST: 21 U/L (ref 0–37)
Albumin: 3.4 g/dL — ABNORMAL LOW (ref 3.5–5.2)
Alkaline Phosphatase: 57 U/L (ref 39–117)
Total Protein: 7 g/dL (ref 6.0–8.3)

## 2012-09-11 LAB — CBC WITH DIFFERENTIAL/PLATELET
Basophils Absolute: 0 10*3/uL (ref 0.0–0.1)
HCT: 38.7 % — ABNORMAL LOW (ref 39.0–52.0)
Lymphs Abs: 1.8 10*3/uL (ref 0.7–4.0)
MCV: 86.7 fl (ref 78.0–100.0)
Monocytes Absolute: 0.4 10*3/uL (ref 0.1–1.0)
Neutrophils Relative %: 61.4 % (ref 43.0–77.0)
Platelets: 428 10*3/uL — ABNORMAL HIGH (ref 150.0–400.0)
RDW: 14 % (ref 11.5–14.6)
WBC: 6.1 10*3/uL (ref 4.5–10.5)

## 2012-09-11 LAB — PSA: PSA: 2.72 ng/mL (ref 0.10–4.00)

## 2012-09-11 LAB — URINALYSIS
Bilirubin Urine: NEGATIVE
Hgb urine dipstick: NEGATIVE
Ketones, ur: NEGATIVE
Total Protein, Urine: NEGATIVE
pH: 7 (ref 5.0–8.0)

## 2012-09-11 LAB — LIPID PANEL
Cholesterol: 180 mg/dL (ref 0–200)
HDL: 46.3 mg/dL (ref >39.00)
Triglycerides: 63 mg/dL (ref 0.0–149.0)

## 2012-10-14 ENCOUNTER — Encounter: Payer: Self-pay | Admitting: Internal Medicine

## 2012-11-07 ENCOUNTER — Ambulatory Visit: Payer: BC Managed Care – PPO | Admitting: Internal Medicine

## 2012-11-11 ENCOUNTER — Telehealth: Payer: Self-pay | Admitting: Internal Medicine

## 2012-11-11 DIAGNOSIS — C61 Malignant neoplasm of prostate: Secondary | ICD-10-CM

## 2012-11-11 HISTORY — DX: Malignant neoplasm of prostate: C61

## 2012-11-12 NOTE — Telephone Encounter (Signed)
Pt was supposed to be seen Friday but appt was cancelled due to inclement weather. Pt rescheduled to see Dr. Marina Goodell 11/17/12@3pm . Pt aware of appt date and time.

## 2012-11-17 ENCOUNTER — Ambulatory Visit (INDEPENDENT_AMBULATORY_CARE_PROVIDER_SITE_OTHER): Payer: BC Managed Care – PPO | Admitting: Internal Medicine

## 2012-11-17 ENCOUNTER — Encounter: Payer: Self-pay | Admitting: Internal Medicine

## 2012-11-17 ENCOUNTER — Other Ambulatory Visit (INDEPENDENT_AMBULATORY_CARE_PROVIDER_SITE_OTHER): Payer: BC Managed Care – PPO

## 2012-11-17 VITALS — BP 110/60 | HR 92 | Ht 74.0 in | Wt 243.4 lb

## 2012-11-17 DIAGNOSIS — Z860100 Personal history of colon polyps, unspecified: Secondary | ICD-10-CM

## 2012-11-17 DIAGNOSIS — R932 Abnormal findings on diagnostic imaging of liver and biliary tract: Secondary | ICD-10-CM

## 2012-11-17 LAB — CREATININE, SERUM: Creatinine, Ser: 1 mg/dL (ref 0.4–1.5)

## 2012-11-17 MED ORDER — MOVIPREP 100 G PO SOLR: 1.0000 | Freq: Once | ORAL | Status: DC

## 2012-11-17 NOTE — Patient Instructions (Addendum)
Your physician has requested that you go to the basement for the following lab work before leaving today: BUN and Creatinine  You have been scheduled for a colonoscopy with propofol. Please follow written instructions given to you at your visit today.  Please pick up your prep kit at the pharmacy within the next 1-3 days. If you use inhalers (even only as needed), please bring them with you on the day of your procedure.  You have been scheduled for an MRI at [facility] on 11-20-12. Your appointment time is 8:00am. Please arrive 15 minutes prior to your appointment time for registration purposes. There is no prep for this test. However, if you have any metal in your body, have a pacemaker or defibrillator, please be sure to let your ordering physician know. This test typically takes 45 minutes to 1 hour to complete.

## 2012-11-17 NOTE — Progress Notes (Signed)
HISTORY OF PRESENT ILLNESS:  [name] [name] is a 62 y.o. male with hypertension, diabetes mellitus, hyperlipidemia, osteoarthritis, COPD, and advanced adenomatous. 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 He was to followup in 3-6 months, but failed to do so despite receiving a recall letter being aware of the importance. There is family history of colon cancer in his parents at age 31. He is sent today by Dr. Dwana Curd of urology regarding hepatic cyst identified on CT scan. Patient had problems with hematuria for which she underwent urology evaluation. CT scan (10/13/2012) revealed a 9.2 cm right hepatic lobe, and cyst posteriorly. He is also known to have small or left hepatic cyst and renal cysts. No previous imaging identified of the liver. Patient tells me that he subsequently underwent prostate biopsy and has prostate cancer. Treatment plan has yet to be identified. The patient's GI review of systems is negative.  REVIEW OF SYSTEMS:  All non-GI ROS negative except for sinus and allergy,  arthritis, back pain  Past Medical History  Diagnosis Date  . Hypertension   . LBP (low back pain)   . Osteoarthritis   . ED (erectile dysfunction)   . Diabetes mellitus type II   . Hyperlipidemia   . Blood in stool   . COPD, moderate     on PFT 2010 due to previous 20y h/o smoking  . Sleep apnea   . Unspecified hyperplasia of prostate with urinary obstruction and other lower urinary tract symptoms (LUTS)   . Colon polyp   . Hypertension     Past Surgical History  Procedure Laterality Date  . Back surgery    . Total knee arthroplasty Left 06-2012    Social History [name] [name]  reports that he quit smoking about 13 years ago. He has never used smokeless tobacco. He reports that he does not drink alcohol or use illicit drugs.  family history includes  Diabetes in his mother; Heart disease in his mother; Hypertension in his other; Pancreatic cancer in his father; and Stomach cancer in his father and sister.  There is no history of Colon cancer.  Allergies  Allergen Reactions  . Benazepril Hcl     REACTION: cough  . Sildenafil   . Simvastatin        PHYSICAL EXAMINATION: Vital signs: BP 110/60  Pulse 92  Ht 6\' 2"  (1.88 m)  Wt 243 lb 6.4 oz (110.406 kg)  BMI 31.24 kg/m2  Constitutional: generally well-appearing, no acute distress Psychiatric: alert and oriented x3, cooperative Eyes: extraocular movements intact, anicteric, conjunctiva pink Mouth: oral pharynx moist, no lesions Neck: supple no lymphadenopathy Cardiovascular: heart regular rate and rhythm, no murmur Lungs: clear to auscultation bilaterally Abdomen: soft, nontender, nondistended, no obvious ascites, no peritoneal signs, normal bowel sounds, no organomegaly Rectal: Deferred until colonoscopy Extremities: no lower extremity edema bilaterally Skin: no lesions on visible extremities Neuro: No focal deficits.   ASSESSMENT:  #1. Incidental large hepatic cyst on the right as well as smaller cyst on the left. Likely benign #2. History of advanced adenomatous. Overdue for followup #3. Recent diagnosis of prostate cancer #4. Multiple general medical problems. Stable   PLAN:  #1. MRI of liver to further characterize the cystic lesions #2. Schedule surveillance colonoscopy.The nature of the procedure, as well as the risks, benefits, and alternatives were carefully and thoroughly reviewed with the patient.  Ample time for discussion and questions allowed. The patient understood, was satisfied, and agreed to proceed. #3. Movi prep prescribed. The patient instructed on its use #4. Ongoing general medical care with Dr. Posey Rea and urologic care with Dr. Berneice Heinrich

## 2012-11-18 ENCOUNTER — Ambulatory Visit (HOSPITAL_COMMUNITY)
Admission: RE | Admit: 2012-11-18 | Discharge: 2012-11-18 | Disposition: A | Discharge status: Home / Self Care | Payer: BC Managed Care – PPO | Source: Ambulatory Visit | Attending: Internal Medicine | Admitting: Internal Medicine

## 2012-11-18 ENCOUNTER — Encounter: Payer: Self-pay | Admitting: Internal Medicine

## 2012-11-18 DIAGNOSIS — N281 Cyst of kidney, acquired: Secondary | ICD-10-CM | POA: Insufficient documentation

## 2012-11-18 DIAGNOSIS — K7689 Other specified diseases of liver: Secondary | ICD-10-CM | POA: Insufficient documentation

## 2012-11-18 MED ORDER — GADOBENATE DIMEGLUMINE 529 MG/ML IV SOLN
20.0000 mL | Freq: Once | INTRAVENOUS | Status: AC | PRN
  Administered 2012-11-18: 20 mL via INTRAVENOUS

## 2012-11-20 ENCOUNTER — Inpatient Hospital Stay (HOSPITAL_COMMUNITY): Admission: RE | Admit: 2012-11-20 | Payer: BC Managed Care – PPO | Source: Ambulatory Visit

## 2012-11-27 ENCOUNTER — Telehealth: Payer: Self-pay | Admitting: Internal Medicine

## 2012-11-27 NOTE — Telephone Encounter (Signed)
Pts wife came in the office today wanted to know about the appt for her husband with [facility]. Informed wife that we have faxed the information to [facility] and we are waiting to hear back from them regarding the appt. We will call them as soon as we hear back regarding the appt and then he will need to take the MRI disc with him.  Wife would like to speak with Dr. Marina Goodell about her husband, she is concerned. Dr. Marina Goodell aware.

## 2012-11-27 NOTE — Telephone Encounter (Signed)
I spoke with Obrian's wife. Answered her questions. Made her feel comfortable. Await hepatology consultation with Dr. Julieta Gutting.

## 2012-12-01 ENCOUNTER — Telehealth: Payer: Self-pay | Admitting: Internal Medicine

## 2012-12-01 NOTE — Telephone Encounter (Signed)
Pt called req order for x-ray. Pt stated that he was diagnosed with prostate cancer and the cancer center req him to have x-ray. Pt was wondering if he could do the x-ray today because he is in the area. Please advise.

## 2012-12-01 NOTE — Telephone Encounter (Signed)
OK. What xray? Thx

## 2012-12-02 NOTE — Telephone Encounter (Signed)
Per pt- he needs CXR and EKG. I scheduled OV for next week. Pt agrees

## 2012-12-10 ENCOUNTER — Encounter: Payer: BC Managed Care – PPO | Admitting: Internal Medicine

## 2012-12-11 ENCOUNTER — Encounter: Payer: Self-pay | Admitting: Internal Medicine

## 2012-12-11 ENCOUNTER — Ambulatory Visit (INDEPENDENT_AMBULATORY_CARE_PROVIDER_SITE_OTHER)
Admission: RE | Admit: 2012-12-11 | Discharge: 2012-12-11 | Disposition: A | Discharge status: Home / Self Care | Payer: BC Managed Care – PPO | Source: Ambulatory Visit | Attending: Internal Medicine | Admitting: Internal Medicine

## 2012-12-11 ENCOUNTER — Ambulatory Visit (INDEPENDENT_AMBULATORY_CARE_PROVIDER_SITE_OTHER): Payer: BC Managed Care – PPO | Admitting: Internal Medicine

## 2012-12-11 VITALS — BP 140/78 | HR 72 | Temp 97.8°F | Resp 16 | Wt 242.0 lb

## 2012-12-11 DIAGNOSIS — E119 Type 2 diabetes mellitus without complications: Secondary | ICD-10-CM

## 2012-12-11 DIAGNOSIS — Z9989 Dependence on other enabling machines and devices: Secondary | ICD-10-CM

## 2012-12-11 DIAGNOSIS — M545 Low back pain: Secondary | ICD-10-CM

## 2012-12-11 DIAGNOSIS — C61 Malignant neoplasm of prostate: Secondary | ICD-10-CM

## 2012-12-11 DIAGNOSIS — I517 Cardiomegaly: Secondary | ICD-10-CM

## 2012-12-11 DIAGNOSIS — G4733 Obstructive sleep apnea (adult) (pediatric): Secondary | ICD-10-CM

## 2012-12-11 DIAGNOSIS — Z136 Encounter for screening for cardiovascular disorders: Secondary | ICD-10-CM

## 2012-12-11 DIAGNOSIS — M1710 Unilateral primary osteoarthritis, unspecified knee: Secondary | ICD-10-CM

## 2012-12-11 DIAGNOSIS — K7689 Other specified diseases of liver: Secondary | ICD-10-CM

## 2012-12-11 NOTE — Assessment & Plan Note (Signed)
OA

## 2012-12-11 NOTE — Assessment & Plan Note (Signed)
He is going to [facility] for treatment RCOG Needs EKG, CXR

## 2012-12-11 NOTE — Assessment & Plan Note (Signed)
6/12 L>>R Bilateral, likely due to OA, gout 10/13 sp L TKR

## 2012-12-11 NOTE — Assessment & Plan Note (Signed)
Continue with current prescription therapy as reflected on the Med list.  

## 2012-12-11 NOTE — Assessment & Plan Note (Signed)
2014 large  Dr Marina Goodell He is going to [facility] for treatment

## 2012-12-11 NOTE — Assessment & Plan Note (Signed)
Doing well 

## 2012-12-11 NOTE — Assessment & Plan Note (Signed)
No relapse 

## 2012-12-12 ENCOUNTER — Telehealth: Payer: Self-pay | Admitting: *Deleted

## 2012-12-12 ENCOUNTER — Encounter: Payer: Self-pay | Admitting: Internal Medicine

## 2012-12-12 ENCOUNTER — Encounter (INDEPENDENT_AMBULATORY_CARE_PROVIDER_SITE_OTHER): Payer: Self-pay | Admitting: General Surgery

## 2012-12-12 ENCOUNTER — Ambulatory Visit (INDEPENDENT_AMBULATORY_CARE_PROVIDER_SITE_OTHER): Payer: BC Managed Care – PPO | Admitting: General Surgery

## 2012-12-12 VITALS — BP 138/78 | HR 82 | Resp 18 | Ht 74.0 in | Wt 244.0 lb

## 2012-12-12 DIAGNOSIS — K769 Liver disease, unspecified: Secondary | ICD-10-CM

## 2012-12-12 DIAGNOSIS — R160 Hepatomegaly, not elsewhere classified: Secondary | ICD-10-CM | POA: Insufficient documentation

## 2012-12-12 DIAGNOSIS — I517 Cardiomegaly: Secondary | ICD-10-CM | POA: Insufficient documentation

## 2012-12-12 NOTE — Telephone Encounter (Signed)
Pt has been notified see cxr results note...lmb

## 2012-12-12 NOTE — Assessment & Plan Note (Signed)
ECHO

## 2012-12-12 NOTE — Telephone Encounter (Signed)
Left mess for patient to call back.  

## 2012-12-12 NOTE — Assessment & Plan Note (Signed)
Pt getting another opinion Monday with Dr. Rush Barer at [facility].  I recommend biopsy.  Usually they go through normal liver to get to the mass in order to minimize risk of bleeding.   I feel that repeating MR in 3 months does not necessarily alter our decision tree, and unless the MR was dramatically different, either larger or smaller lesion, it would not alter our course of action.  I would not want to undertake liver surgery without some evidence that this is a lesion that needs to be resected or remains diagnostically uncertain.  Also, a biopsy would help prioritize treatment. If benign, would proceed with prostate cancer treatment before intervention for liver mass.  Pt was advised that there is risk of doing nothing, risk of biopsy, and risk of surgery.  At this point, they favor biopsy.  They will get Dr. Roel Cluck opinion and

## 2012-12-12 NOTE — Progress Notes (Signed)
Chief Complaint  Patient presents with  . Other    Eval liver cancer    HISTORY: Pt is 62 yo M with recent dx of prostate cancer made after physical exam revealed prostate nodule.  He underwent CT scan which demonstrated large liver mass.  MRI was subsequently performed illustrating complex cystic lesion with firm capsule and blood in center.  He has no abdominal pain, no nausea or vomiting.  No early satiety.  He denies fever/ chills.  He has no history of heart failure.  His prostate cancer is low stage.    Past Medical History  Diagnosis Date  . Hypertension   . LBP (low back pain)   . Osteoarthritis   . ED (erectile dysfunction)   . Diabetes mellitus type II   . Hyperlipidemia   . Blood in stool   . COPD, moderate     on PFT 2010 due to previous 20y h/o smoking  . Sleep apnea   . Unspecified hyperplasia of prostate with urinary obstruction and other lower urinary tract symptoms (LUTS)   . Colon polyp   . Hypertension     Past Surgical History  Procedure Laterality Date  . Back surgery    . Total knee arthroplasty Left 06-2012  . Vasectomy      Current Outpatient Prescriptions  Medication Sig Dispense Refill  . amLODipine-olmesartan (AZOR) 10-40 MG per tablet Take 1 tablet by mouth daily.  90 tablet  3  . cholecalciferol (VITAMIN D) 1000 UNITS tablet Take 1,000 Units by mouth daily.        . fish oil-omega-3 fatty acids 1000 MG capsule Take 2 g by mouth daily.      . Multiple Vitamin (MULTIVITAMIN) tablet Take 1 tablet by mouth daily.      . pioglitazone-metformin (ACTOPLUS MET) 15-850 MG per tablet Take 1 tablet by mouth 2 (two) times daily with a meal.  180 tablet  3  . traMADol (ULTRAM) 50 MG tablet Take 50 mg by mouth every 6 (six) hours as needed for pain.      Marland Kitchen aspirin 81 MG EC tablet Take 81 mg by mouth daily.        . diclofenac sodium (VOLTAREN) 1 % GEL Apply 1 application topically 4 (four) times daily.  100 g  5  . tadalafil (CIALIS) 20 MG tablet Take 1  tablet (20 mg total) by mouth daily as needed.  12 tablet  3   No current facility-administered medications for this visit.     Allergies  Allergen Reactions  . Benazepril Hcl     REACTION: cough  . Sildenafil      Family History  Problem Relation Age of Onset  . Diabetes Mother   . Heart disease Mother   . Pancreatic cancer Father   . Hypertension Other   . Stomach cancer Father   . Stomach cancer Sister   . Colon cancer Neg Hx      History   Social History  . Marital Status: Married    Spouse Name: N/A    Number of Children: N/A  . Years of Education: N/A   Occupational History  . [employer]   . teaching    Social History Main Topics  . Smoking status: Former Smoker    Quit date: 09/04/1999  . Smokeless tobacco: Never Used  . Alcohol Use: No  . Drug Use: No  . Sexually Active: Yes   Other Topics Concern  . None   Social History Narrative  Regular exercise- yes, walking     REVIEW OF SYSTEMS - PERTINENT POSITIVES ONLY: 12 point review of systems negative other than HPI and PMH except for hematuria  EXAM: Filed Vitals:   12/12/12 0856  BP: 138/78  Pulse: 82  Resp: 18    Gen:  No acute distress.  Well nourished and well groomed.   Neurological: Alert and oriented to person, place, and time. Coordination normal.  Head: Normocephalic and atraumatic.  Eyes: Conjunctivae are normal. Pupils are equal, round, and reactive to light. No scleral icterus.  Neck: Normal range of motion. Neck supple. No tracheal deviation or thyromegaly present.  Cardiovascular: Normal rate, regular rhythm, normal heart sounds and intact distal pulses.  Exam reveals no gallop and no friction rub.  No murmur heard. Respiratory: Effort normal.  No respiratory distress. No chest wall tenderness. Breath sounds normal.  No wheezes, rales or rhonchi.  GI: Soft. Bowel sounds are normal. The abdomen is soft and nontender.  There is no rebound and no guarding. No hepatosplenomegaly.    Musculoskeletal: Normal range of motion. Extremities are nontender.  Lymphadenopathy: No cervical, preauricular, postauricular or axillary adenopathy is present Skin: Skin is warm and dry. No rash noted. No diaphoresis. No erythema. No pallor. No clubbing, cyanosis, or edema.   Psychiatric: Normal mood and affect. Behavior is normal. Judgment and thought content normal.    LABORATORY RESULTS: Available labs are reviewed  See results    RADIOLOGY RESULTS: See E-Chart or I-Site for most recent results.  Images and reports are reviewed. MRI abd  IMPRESSION:  1. Large hemorrhagic cyst like lesion within the right hepatic  lobe with thickened nodular rim medially and mild delayed  enhancement. Differential would include post traumatic or  inflammatory cyst versus biliary cystadenoma or cystadenocarcinoma.  Cannot exclude a metastatic neoplasm. Recommend ultrasound  percutaneous biopsy. Lesion aspiration prior to biopsy may be  beneficial.  2. Small benign hepatic cysts and several benign renal cysts.     ASSESSMENT AND PLAN: Liver mass Pt getting another opinion Monday with Dr. Rush Barer at [facility].  I recommend biopsy.  Usually they go through normal liver to get to the mass in order to minimize risk of bleeding.   I feel that repeating MR in 3 months does not necessarily alter our decision tree, and unless the MR was dramatically different, either larger or smaller lesion, it would not alter our course of action.  I would not want to undertake liver surgery without some evidence that this is a lesion that needs to be resected or remains diagnostically uncertain.  Also, a biopsy would help prioritize treatment. If benign, would proceed with prostate cancer treatment before intervention for liver mass.  Pt was advised that there is risk of doing nothing, risk of biopsy, and risk of surgery.  At this point, they favor biopsy.  They will get Dr. Roel Cluck opinion and      Maudry Diego  MD Surgical Oncology, General and Endocrine Surgery [facility]      Visit Diagnoses: 1. Liver mass     Primary Care Physician: Sonda Primes, MD

## 2012-12-12 NOTE — Telephone Encounter (Signed)
Message copied by Merrilyn Puma on Fri Dec 12, 2012  9:44 AM ------      Message from: Tresa Garter      Created: Fri Dec 12, 2012  7:35 AM       Will sch ECHO ------

## 2012-12-12 NOTE — Patient Instructions (Addendum)
See Dr. Rush Barer.  Let us know your final decision.

## 2012-12-12 NOTE — Progress Notes (Signed)
   Subjective:    HPI   The patient presents for a follow-up of  chronic hypertension, chronic dyslipidemia, type 2 diabetes controlled with medicines. F/u new prostate ca and liver cyst. He needs an EKG and a CXR for his visit to [facility]  He is s/p L TKR 10/13 - better  Review of Systems  Constitutional: Negative for appetite change, fatigue and unexpected weight change.  HENT: Negative for nosebleeds, congestion, sore throat, sneezing, trouble swallowing and neck pain.   Eyes: Negative for itching and visual disturbance.  Respiratory: Negative for cough.   Cardiovascular: Negative for chest pain, palpitations and leg swelling.  Gastrointestinal: Negative for nausea, diarrhea, blood in stool and abdominal distention.  Genitourinary: Negative for frequency and hematuria.  Musculoskeletal: Positive for joint swelling (Knees, L heel) and gait problem. Negative for back pain.  Skin: Negative for rash.  Neurological: Negative for dizziness, tremors, speech difficulty and weakness.  Psychiatric/Behavioral: Negative for sleep disturbance, dysphoric mood and agitation. The patient is not nervous/anxious.        Objective:   Physical Exam  Constitutional: He is oriented to person, place, and time. He appears well-developed.  HENT:  Mouth/Throat: Oropharynx is clear and moist.  Eyes: Conjunctivae are normal. Pupils are equal, round, and reactive to light.  Neck: Normal range of motion. No JVD present. No thyromegaly present.  Cardiovascular: Normal rate, regular rhythm, normal heart sounds and intact distal pulses.  Exam reveals no gallop and no friction rub.   No murmur heard. Pulmonary/Chest: Effort normal and breath sounds normal. No respiratory distress. He has no wheezes. He has no rales. He exhibits no tenderness.  Abdominal: Soft. Bowel sounds are normal. He exhibits no distension and no mass. There is no tenderness. There is no rebound and no guarding.  Musculoskeletal: Normal range  of motion. He exhibits tenderness (L shoulder, L trap, L elbow is tender). He exhibits no edema.  Lymphadenopathy:    He has no cervical adenopathy.  Neurological: He is alert and oriented to person, place, and time. He has normal reflexes. No cranial nerve deficit. He exhibits normal muscle tone. Coordination normal.  Skin: Skin is warm and dry. No rash noted.  Psychiatric: He has a normal mood and affect. His behavior is normal. Judgment and thought content normal.   DTRs ok MS is ok  L knee swollen a little - better     Assessment & Plan:

## 2012-12-16 ENCOUNTER — Telehealth: Payer: Self-pay | Admitting: *Deleted

## 2012-12-16 NOTE — Telephone Encounter (Signed)
Need letter of surgical clearance faxed to [phone] for his upcoming prostate seed placement and six to seven weeks of external beam irradiation. They need CXR and EKG along with letter.

## 2012-12-22 ENCOUNTER — Encounter: Payer: Self-pay | Admitting: *Deleted

## 2012-12-22 NOTE — Telephone Encounter (Signed)
Letter needs to be the attention of [facility].

## 2012-12-23 NOTE — Telephone Encounter (Signed)
Letter ready, pls enclose CXR, EKG Thx

## 2012-12-23 NOTE — Telephone Encounter (Signed)
All requested info faxed to [place] at number below.

## 2012-12-26 ENCOUNTER — Ambulatory Visit (HOSPITAL_COMMUNITY): Payer: BC Managed Care – PPO | Attending: Cardiology | Admitting: Radiology

## 2012-12-26 DIAGNOSIS — G4733 Obstructive sleep apnea (adult) (pediatric): Secondary | ICD-10-CM | POA: Insufficient documentation

## 2012-12-26 DIAGNOSIS — E785 Hyperlipidemia, unspecified: Secondary | ICD-10-CM | POA: Insufficient documentation

## 2012-12-26 DIAGNOSIS — I10 Essential (primary) hypertension: Secondary | ICD-10-CM | POA: Insufficient documentation

## 2012-12-26 DIAGNOSIS — E119 Type 2 diabetes mellitus without complications: Secondary | ICD-10-CM | POA: Insufficient documentation

## 2012-12-26 DIAGNOSIS — I517 Cardiomegaly: Secondary | ICD-10-CM | POA: Insufficient documentation

## 2012-12-26 HISTORY — PX: TRANSTHORACIC ECHOCARDIOGRAM: SHX275

## 2012-12-26 NOTE — Progress Notes (Signed)
Echocardiogram performed.  

## 2013-01-06 ENCOUNTER — Ambulatory Visit: Payer: BC Managed Care – PPO | Admitting: Internal Medicine

## 2013-01-19 ENCOUNTER — Ambulatory Visit: Payer: BC Managed Care – PPO | Admitting: Radiation Oncology

## 2013-01-19 ENCOUNTER — Ambulatory Visit: Payer: BC Managed Care – PPO

## 2013-03-09 ENCOUNTER — Telehealth: Payer: Self-pay | Admitting: Internal Medicine

## 2013-03-09 NOTE — Telephone Encounter (Signed)
Ok - labs were in the [employer]

## 2013-03-09 NOTE — Telephone Encounter (Signed)
Pt moved his follow up up for 04-09-2013 want to know if the Dr will  Do lab work please advise call daughter @ [phone] [place]

## 2013-03-18 ENCOUNTER — Telehealth: Payer: Self-pay | Admitting: *Deleted

## 2013-03-18 ENCOUNTER — Telehealth: Payer: Self-pay | Admitting: Internal Medicine

## 2013-03-18 DIAGNOSIS — C61 Malignant neoplasm of prostate: Secondary | ICD-10-CM

## 2013-03-18 NOTE — Telephone Encounter (Signed)
Pt's wife left a vm stating the pt recently completed treatment for prostate cancer. She wants to make sure he can have his PSA checked at his next OV on 04/09/13.

## 2013-03-18 NOTE — Telephone Encounter (Signed)
Patient is requesting a repeat MRI to evaluate liver cyst? He has completed his prostate treatment.  If you need to speak with them call at [phone]

## 2013-03-18 NOTE — Telephone Encounter (Signed)
The patient was seen by a general surgeon in [place] as well as a Dr. Rush Barer at [facility]. I am out of the loop in terms of what may have recommended. Did someone want him to have another MRI? If so, who? They should probably order the exam and followup with him. Let me know. Thanks

## 2013-03-18 NOTE — Telephone Encounter (Signed)
Ok PSA Thx

## 2013-03-18 NOTE — Telephone Encounter (Signed)
Discussed again with Dr. Marina Goodell upon his further review, he recommends that they return to Dr. Julieta Gutting as that is the hepatologist that he sent them to after the 1st MRI in March.  He and his wife are notified

## 2013-03-19 NOTE — Telephone Encounter (Signed)
Pt informed

## 2013-03-27 ENCOUNTER — Other Ambulatory Visit: Payer: Self-pay | Admitting: *Deleted

## 2013-03-27 MED ORDER — AMLODIPINE-OLMESARTAN 10-40 MG PO TABS: 1.0000 | ORAL_TABLET | Freq: Every day | ORAL | Status: DC

## 2013-03-30 ENCOUNTER — Other Ambulatory Visit (INDEPENDENT_AMBULATORY_CARE_PROVIDER_SITE_OTHER): Payer: BC Managed Care – PPO

## 2013-03-30 DIAGNOSIS — Z9989 Dependence on other enabling machines and devices: Secondary | ICD-10-CM

## 2013-03-30 DIAGNOSIS — Z136 Encounter for screening for cardiovascular disorders: Secondary | ICD-10-CM

## 2013-03-30 DIAGNOSIS — M1710 Unilateral primary osteoarthritis, unspecified knee: Secondary | ICD-10-CM

## 2013-03-30 DIAGNOSIS — K7689 Other specified diseases of liver: Secondary | ICD-10-CM

## 2013-03-30 DIAGNOSIS — M545 Low back pain, unspecified: Secondary | ICD-10-CM

## 2013-03-30 DIAGNOSIS — G4733 Obstructive sleep apnea (adult) (pediatric): Secondary | ICD-10-CM

## 2013-03-30 DIAGNOSIS — E119 Type 2 diabetes mellitus without complications: Secondary | ICD-10-CM

## 2013-03-30 DIAGNOSIS — C61 Malignant neoplasm of prostate: Secondary | ICD-10-CM

## 2013-03-30 LAB — HEPATIC FUNCTION PANEL
ALT: 16 U/L (ref 0–53)
AST: 19 U/L (ref 0–37)
Alkaline Phosphatase: 59 U/L (ref 39–117)
Bilirubin, Direct: 0.2 mg/dL (ref 0.0–0.3)
Total Bilirubin: 1.1 mg/dL (ref 0.3–1.2)
Total Protein: 7.2 g/dL (ref 6.0–8.3)

## 2013-03-30 LAB — BASIC METABOLIC PANEL
CO2: 27 mEq/L (ref 19–32)
Chloride: 105 mEq/L (ref 96–112)
Glucose, Bld: 132 mg/dL — ABNORMAL HIGH (ref 70–99)
Sodium: 140 mEq/L (ref 135–145)

## 2013-04-09 ENCOUNTER — Ambulatory Visit (INDEPENDENT_AMBULATORY_CARE_PROVIDER_SITE_OTHER): Payer: BC Managed Care – PPO | Admitting: Internal Medicine

## 2013-04-09 ENCOUNTER — Encounter: Payer: Self-pay | Admitting: Internal Medicine

## 2013-04-09 VITALS — BP 160/90 | HR 76 | Temp 98.1°F | Resp 16 | Wt 245.0 lb

## 2013-04-09 DIAGNOSIS — E119 Type 2 diabetes mellitus without complications: Secondary | ICD-10-CM

## 2013-04-09 DIAGNOSIS — M1990 Unspecified osteoarthritis, unspecified site: Secondary | ICD-10-CM

## 2013-04-09 DIAGNOSIS — I10 Essential (primary) hypertension: Secondary | ICD-10-CM

## 2013-04-09 DIAGNOSIS — C61 Malignant neoplasm of prostate: Secondary | ICD-10-CM

## 2013-04-09 MED ORDER — TADALAFIL 20 MG PO TABS: 20.0000 mg | ORAL_TABLET | Freq: Every day | ORAL | Status: DC | PRN

## 2013-04-09 NOTE — Assessment & Plan Note (Signed)
7/14 s/p XRT seeds in [place] ([facility])

## 2013-04-09 NOTE — Assessment & Plan Note (Signed)
Continue with current prescription therapy as reflected on the Med list.  

## 2013-04-12 NOTE — Assessment & Plan Note (Signed)
Continue with current prescription therapy as reflected on the Med list.  

## 2013-04-12 NOTE — Progress Notes (Signed)
   Subjective:    HPI   The patient presents for a follow-up of  chronic hypertension, chronic dyslipidemia, type 2 diabetes controlled with medicines. F/u new prostate ca (s/p XRT seeds) and liver cyst (in the process of work up). He needs an EKG and a CXR for his visit to [facility]  He is s/p L TKR 10/13 - better  Review of Systems  Constitutional: Negative for appetite change, fatigue and unexpected weight change.  HENT: Negative for nosebleeds, congestion, sore throat, sneezing, trouble swallowing and neck pain.   Eyes: Negative for itching and visual disturbance.  Respiratory: Negative for cough.   Cardiovascular: Negative for chest pain, palpitations and leg swelling.  Gastrointestinal: Negative for nausea, diarrhea, blood in stool and abdominal distention.  Genitourinary: Negative for frequency and hematuria.  Musculoskeletal: Positive for joint swelling (Knees, L heel) and gait problem. Negative for back pain.  Skin: Negative for rash.  Neurological: Negative for dizziness, tremors, speech difficulty and weakness.  Psychiatric/Behavioral: Negative for sleep disturbance, dysphoric mood and agitation. The patient is not nervous/anxious.        Objective:   Physical Exam  Constitutional: He is oriented to person, place, and time. He appears well-developed.  HENT:  Mouth/Throat: Oropharynx is clear and moist.  Eyes: Conjunctivae are normal. Pupils are equal, round, and reactive to light.  Neck: Normal range of motion. No JVD present. No thyromegaly present.  Cardiovascular: Normal rate, regular rhythm, normal heart sounds and intact distal pulses.  Exam reveals no gallop and no friction rub.   No murmur heard. Pulmonary/Chest: Effort normal and breath sounds normal. No respiratory distress. He has no wheezes. He has no rales. He exhibits no tenderness.  Abdominal: Soft. Bowel sounds are normal. He exhibits no distension and no mass. There is no tenderness. There is no rebound and no  guarding.  Musculoskeletal: Normal range of motion. He exhibits no edema and no tenderness.  Lymphadenopathy:    He has no cervical adenopathy.  Neurological: He is alert and oriented to person, place, and time. He has normal reflexes. No cranial nerve deficit. He exhibits normal muscle tone. Coordination normal.  Skin: Skin is warm and dry. No rash noted.  Psychiatric: He has a normal mood and affect. His behavior is normal. Judgment and thought content normal.   DTRs ok MS is ok  L knee is better     Assessment & Plan:

## 2013-04-17 ENCOUNTER — Ambulatory Visit: Payer: BC Managed Care – PPO | Admitting: Internal Medicine

## 2013-04-22 ENCOUNTER — Telehealth: Payer: Self-pay | Admitting: *Deleted

## 2013-04-22 DIAGNOSIS — Z01810 Encounter for preprocedural cardiovascular examination: Secondary | ICD-10-CM

## 2013-04-22 DIAGNOSIS — E119 Type 2 diabetes mellitus without complications: Secondary | ICD-10-CM

## 2013-04-22 NOTE — Telephone Encounter (Signed)
Pt called requesting a referral for a stress test that is being requested for a Pre OP.  Please advise

## 2013-04-22 NOTE — Telephone Encounter (Signed)
Ok Thx 

## 2013-04-23 ENCOUNTER — Telehealth: Payer: Self-pay | Admitting: Internal Medicine

## 2013-04-23 NOTE — Telephone Encounter (Signed)
Spoke with pts wife advised of MD order for stress test.

## 2013-04-23 NOTE — Telephone Encounter (Signed)
rec'd records from [facility] to Dr.Perry

## 2013-04-30 ENCOUNTER — Encounter (HOSPITAL_COMMUNITY): Payer: BC Managed Care – PPO

## 2013-05-05 ENCOUNTER — Telehealth: Payer: Self-pay | Admitting: *Deleted

## 2013-05-05 ENCOUNTER — Ambulatory Visit (HOSPITAL_COMMUNITY): Payer: BC Managed Care – PPO | Attending: Internal Medicine | Admitting: Radiology

## 2013-05-05 VITALS — BP 133/62 | Ht 72.0 in | Wt 250.0 lb

## 2013-05-05 DIAGNOSIS — E119 Type 2 diabetes mellitus without complications: Secondary | ICD-10-CM | POA: Insufficient documentation

## 2013-05-05 DIAGNOSIS — Z923 Personal history of irradiation: Secondary | ICD-10-CM | POA: Insufficient documentation

## 2013-05-05 DIAGNOSIS — I491 Atrial premature depolarization: Secondary | ICD-10-CM

## 2013-05-05 DIAGNOSIS — R002 Palpitations: Secondary | ICD-10-CM | POA: Insufficient documentation

## 2013-05-05 DIAGNOSIS — R079 Chest pain, unspecified: Secondary | ICD-10-CM

## 2013-05-05 DIAGNOSIS — I10 Essential (primary) hypertension: Secondary | ICD-10-CM | POA: Insufficient documentation

## 2013-05-05 DIAGNOSIS — E785 Hyperlipidemia, unspecified: Secondary | ICD-10-CM | POA: Insufficient documentation

## 2013-05-05 DIAGNOSIS — Z01810 Encounter for preprocedural cardiovascular examination: Secondary | ICD-10-CM | POA: Insufficient documentation

## 2013-05-05 DIAGNOSIS — R222 Localized swelling, mass and lump, trunk: Secondary | ICD-10-CM | POA: Insufficient documentation

## 2013-05-05 DIAGNOSIS — Z8546 Personal history of malignant neoplasm of prostate: Secondary | ICD-10-CM | POA: Insufficient documentation

## 2013-05-05 MED ORDER — TECHNETIUM TC 99M SESTAMIBI GENERIC - CARDIOLITE
11.0000 | Freq: Once | INTRAVENOUS | Status: AC | PRN
  Administered 2013-05-05: 11 via INTRAVENOUS

## 2013-05-05 MED ORDER — TECHNETIUM TC 99M SESTAMIBI GENERIC - CARDIOLITE
33.0000 | Freq: Once | INTRAVENOUS | Status: AC | PRN
  Administered 2013-05-05: 33 via INTRAVENOUS

## 2013-05-05 MED ORDER — REGADENOSON 0.4 MG/5ML IV SOLN
0.4000 mg | Freq: Once | INTRAVENOUS | Status: AC
  Administered 2013-05-05: 0.4 mg via INTRAVENOUS

## 2013-05-05 NOTE — Progress Notes (Signed)
[facility] 3 NUCLEAR MED [address] [place], [place] [address] [phone]    Cardiology Nuclear Med Study  [name] [name] is a 62 y.o. male     MRN : [id number]     DOB: Jul 31, 1951  Procedure Date: 05/05/2013  Nuclear Med Background Indication for Stress Test:  Evaluation for Ischemia and Surgical Clearance:Lung mass with surgery scheduled 06/12/13 Dr. Daleen Squibb at [facility] History:  01/11 MPS: NL EF: 61%, 4/14 ECHO: EF: 55-60%, H/O Radiation for Prostate CA. Cardiac Risk Factors: Hypertension, Lipids and NIDDM  Symptoms:  Palpitations   Nuclear Pre-Procedure Caffeine/Decaff Intake:  None NPO After: 7:00pm   Lungs:  clear O2 Sat: 97% on room air. IV 0.9% NS with Angio Cath:  22g  IV Site: R Hand  IV Started by:  Cathlyn Parsons, RN  Chest Size (in):  48 Cup Size: n/a  Height: 6' (1.829 m)  Weight:  250 lb (113.399 kg)  BMI:  Body mass index is 33.9 kg/(m^2). Tech Comments:  n/a    Nuclear Med Study 1 or 2 day study: 1 day  Stress Test Type:  Treadmill/Lexiscan  Reading MD: Kristeen Miss, MD  Order Authorizing Provider:  Trinna Post Plotnikov,MD  Resting Radionuclide: Technetium 22m Sestamibi  Resting Radionuclide Dose: 11.0 mCi   Stress Radionuclide:  Technetium 74m Sestamibi  Stress Radionuclide Dose: 33.0 mCi           Stress Protocol Rest HR: 75 Stress HR: 107  Rest BP: 133/62 Stress BP: 154/59  Exercise Time (min): n/a METS: n/a   Predicted Max HR: 157 bpm % Max HR: 68.15 bpm Rate Pressure Product: 29528   Dose of Adenosine (mg):  n/a Dose of Lexiscan: 0.4 mg  Dose of Atropine (mg): n/a Dose of Dobutamine: n/a mcg/kg/min (at max HR)  Stress Test Technologist: Milana Na, EMT-P  Nuclear Technologist:  Harlow Asa, CNMT     Rest Procedure:  Myocardial perfusion imaging was performed at rest 45 minutes following the intravenous administration of Technetium 84m Sestamibi. Rest ECG: NSR - Normal EKG  Stress Procedure:  The patient received  IV Lexiscan 0.4 mg over 15-seconds with concurrent low level exercise and then Technetium 23m Sestamibi was injected at 30-seconds while the patient continued walking one more minute.  This patient had sob with the Lexiscan injection. Quantitative spect images were obtained after a 45-minute delay. Stress ECG: No significant change from baseline ECG  QPS Raw Data Images:  There is interference from nuclear activity from structures below the diaphragm. This does not affect the ability to read the study. Stress Images:  Normal homogeneous uptake in all areas of the myocardium. Rest Images:  Normal homogeneous uptake in all areas of the myocardium. Subtraction (SDS):  No evidence of ischemia. Transient Ischemic Dilatation (Normal <1.22):  n/a Lung/Heart Ratio (Normal <0.45):  0.42  Quantitative Gated Spect Images QGS EDV:  134 ml QGS ESV:  36 ml  Impression Exercise Capacity:  Lexiscan with no exercise. BP Response:  Normal blood pressure response. Clinical Symptoms:  No significant symptoms noted. ECG Impression:  No significant ST segment change suggestive of ischemia. Comparison with Prior Nuclear Study: No significant change from previous study  Overall Impression:  Normal stress nuclear study.  No evidence of ischemia.  Normal LV function.  LV Ejection Fraction: 73%.  LV Wall Motion:  NL LV Function; NL Wall Motion.   Vesta Mixer, Montez Hageman., MD, [facility] 05/05/2013, 5:02 PM Office - [phone] Pager [phone]

## 2013-05-05 NOTE — Telephone Encounter (Signed)
Rf req for Ibuprofen 600 mg 1 bid x 2 weeks then prn pain. # 60. Med was d/c in EMR. Please advise, ok to RF?

## 2013-05-05 NOTE — Telephone Encounter (Signed)
OK to fill this prescription with additional refills x2 Thank you!  

## 2013-05-06 MED ORDER — IBUPROFEN 600 MG PO TABS: ORAL_TABLET | ORAL | Status: DC

## 2013-05-06 NOTE — Telephone Encounter (Signed)
Done

## 2013-05-19 ENCOUNTER — Telehealth: Payer: Self-pay | Admitting: Internal Medicine

## 2013-05-19 NOTE — Telephone Encounter (Signed)
A physician from [facility] called and wanted to relay a msg that the pt was seen in the ED.  He was diagnosed with A-fib with an EKG.  The physician asked that a follow up be scheduled before his upcoming surgery.  Calling pt now to schedule.

## 2013-05-19 NOTE — Telephone Encounter (Signed)
Ok to sch Thx 

## 2013-05-20 NOTE — Telephone Encounter (Signed)
OV scheduled 05/25/13.

## 2013-05-25 ENCOUNTER — Ambulatory Visit (INDEPENDENT_AMBULATORY_CARE_PROVIDER_SITE_OTHER): Payer: BC Managed Care – PPO | Admitting: Internal Medicine

## 2013-05-25 ENCOUNTER — Encounter: Payer: Self-pay | Admitting: Internal Medicine

## 2013-05-25 VITALS — BP 130/80 | HR 76 | Temp 98.7°F | Resp 12 | Wt 250.0 lb

## 2013-05-25 DIAGNOSIS — R079 Chest pain, unspecified: Secondary | ICD-10-CM

## 2013-05-25 DIAGNOSIS — I4891 Unspecified atrial fibrillation: Secondary | ICD-10-CM

## 2013-05-25 DIAGNOSIS — I10 Essential (primary) hypertension: Secondary | ICD-10-CM

## 2013-05-25 DIAGNOSIS — E119 Type 2 diabetes mellitus without complications: Secondary | ICD-10-CM

## 2013-05-25 DIAGNOSIS — Z23 Encounter for immunization: Secondary | ICD-10-CM

## 2013-05-25 DIAGNOSIS — I480 Paroxysmal atrial fibrillation: Secondary | ICD-10-CM

## 2013-05-25 MED ORDER — ASPIRIN 325 MG PO TABS: 325.0000 mg | ORAL_TABLET | Freq: Every day | ORAL | Status: DC

## 2013-05-25 NOTE — Assessment & Plan Note (Addendum)
9/14 short PAF during stress test at [facility] Increase ASA to 325 mg qd

## 2013-05-25 NOTE — Progress Notes (Signed)
   Subjective:    HPI  9/14 short PAF during stress test at [facility]  We discussed age appropriate health related issues, including available/recomended screening tests and vaccinations. We discussed a need for adhering to healthy diet and exercise. Labs/EKG were reviewed/ordered. All questions were answered.   ACE inhibitor therapy was not prescribed due to .  The patient presents for a follow-up of  chronic hypertension, chronic dyslipidemia, type 2 diabetes controlled with medicines. F/u new prostate ca (s/p XRT seeds) and liver cyst (in the process of work up). He needs an EKG and a CXR for his visit to [facility]  He is s/p L TKR 10/13 - better  Review of Systems  Constitutional: Negative for appetite change, fatigue and unexpected weight change.  HENT: Negative for nosebleeds, congestion, sore throat, sneezing, trouble swallowing and neck pain.   Eyes: Negative for itching and visual disturbance.  Respiratory: Negative for cough.   Cardiovascular: Negative for chest pain, palpitations and leg swelling.  Gastrointestinal: Negative for nausea, diarrhea, blood in stool and abdominal distention.  Genitourinary: Negative for frequency and hematuria.  Musculoskeletal: Positive for joint swelling (Knees, L heel) and gait problem. Negative for back pain.  Skin: Negative for rash.  Neurological: Negative for dizziness, tremors, speech difficulty and weakness.  Psychiatric/Behavioral: Negative for sleep disturbance, dysphoric mood and agitation. The patient is not nervous/anxious.        Objective:   Physical Exam  Constitutional: He is oriented to person, place, and time. He appears well-developed.  HENT:  Mouth/Throat: Oropharynx is clear and moist.  Eyes: Conjunctivae are normal. Pupils are equal, round, and reactive to light.  Neck: Normal range of motion. No JVD present. No thyromegaly present.  Cardiovascular: Normal rate, regular rhythm, normal heart sounds and intact distal pulses.   Exam reveals no gallop and no friction rub.   No murmur heard. Pulmonary/Chest: Effort normal and breath sounds normal. No respiratory distress. He has no wheezes. He has no rales. He exhibits no tenderness.  Abdominal: Soft. Bowel sounds are normal. He exhibits no distension and no mass. There is no tenderness. There is no rebound and no guarding.  Musculoskeletal: Normal range of motion. He exhibits no edema and no tenderness.  Lymphadenopathy:    He has no cervical adenopathy.  Neurological: He is alert and oriented to person, place, and time. He has normal reflexes. No cranial nerve deficit. He exhibits normal muscle tone. Coordination normal.  Skin: Skin is warm and dry. No rash noted.  Psychiatric: He has a normal mood and affect. His behavior is normal. Judgment and thought content normal.   DTRs ok MS is ok  L knee is better     Assessment & Plan:

## 2013-05-27 ENCOUNTER — Encounter: Payer: Self-pay | Admitting: Internal Medicine

## 2013-05-27 NOTE — Assessment & Plan Note (Signed)
Continue with current prescription therapy as reflected on the Med list.  

## 2013-06-03 DIAGNOSIS — R160 Hepatomegaly, not elsewhere classified: Secondary | ICD-10-CM

## 2013-06-03 HISTORY — DX: Hepatomegaly, not elsewhere classified: R16.0

## 2013-06-23 ENCOUNTER — Encounter: Payer: Self-pay | Admitting: Cardiology

## 2013-06-23 ENCOUNTER — Ambulatory Visit (INDEPENDENT_AMBULATORY_CARE_PROVIDER_SITE_OTHER): Payer: BC Managed Care – PPO | Admitting: Cardiology

## 2013-06-23 ENCOUNTER — Ambulatory Visit: Payer: BC Managed Care – PPO | Admitting: Physician Assistant

## 2013-06-23 VITALS — BP 104/66 | HR 79 | Ht 74.5 in | Wt 244.0 lb

## 2013-06-23 DIAGNOSIS — I4891 Unspecified atrial fibrillation: Secondary | ICD-10-CM

## 2013-06-23 MED ORDER — METOPROLOL TARTRATE 50 MG PO TABS: 50.0000 mg | ORAL_TABLET | Freq: Two times a day (BID) | ORAL | Status: DC

## 2013-06-23 NOTE — Progress Notes (Signed)
HPI The patient is new to me.   He has a history recently diagnosed up paroxysmal atrial fibrillation. He has no prior cardiac history. He was apparently noticed to have some PAF on an EKG at [facility] noted prior to having liver surgery. However, this was apparently very short in duration.   He did have a liver surgery and postoperatively had rapid atrial fibrillation. The liver lesion turned out to be benign. I was able to review records from [facility] including the printout of the reading of an EKG which was interpreted as atrial fibrillation. He was sent home on metoprolol. He was quite ill at that time and wasn't aware of this atrial fibrillation but has never felt previously. He does not have presyncope or syncope. He has not had chest pressure , neck or arm discomfort. Is not have shortness of breath, PND or orthopnea. He has had chronic lower extremity swelling which is worse recently since surgery. Of note preoperatively he did have a stress perfusion study and I reviewed these results. This was normal with preserved ejection fraction. He has had an echocardiogram with some very mild septal hypertrophy.   Allergies  Allergen Reactions  . Benazepril Hcl     REACTION: cough  . Sildenafil     Unknown    Current Outpatient Prescriptions  Medication Sig Dispense Refill  . amLODipine-olmesartan (AZOR) 10-40 MG per tablet Take 1 tablet by mouth daily.  90 tablet  3  . aspirin (BAYER ASPIRIN) 325 MG tablet Take 1 tablet (325 mg total) by mouth daily.  100 tablet  3  . cholecalciferol (VITAMIN D) 1000 UNITS tablet Take 1,000 Units by mouth daily.        Marland Kitchen ibuprofen (ADVIL,MOTRIN) 600 MG tablet Take twice a day x 2 weeks, then prn pain  60 tablet  2  . metoprolol (LOPRESSOR) 50 MG tablet Take 50 mg by mouth 2 (two) times daily.      . Multiple Vitamin (MULTIVITAMIN) tablet Take 1 tablet by mouth daily.      . NON FORMULARY C-PAP at night      . pioglitazone-metformin (ACTOPLUS MET) 15-850 MG per tablet  Take 1 tablet by mouth 2 (two) times daily with a meal.  180 tablet  3  . tadalafil (CIALIS) 20 MG tablet Take 1 tablet (20 mg total) by mouth daily as needed.  12 tablet  3  . tamsulosin (FLOMAX) 0.4 MG CAPS capsule Take 0.4 mg by mouth daily.      . traMADol (ULTRAM) 50 MG tablet Take 50 mg by mouth every 6 (six) hours as needed for pain.      Marland Kitchen diclofenac sodium (VOLTAREN) 1 % GEL Apply 1 application topically 4 (four) times daily.  100 g  5   No current facility-administered medications for this visit.    Past Medical History  Diagnosis Date  . Hypertension   . LBP (low back pain)   . Osteoarthritis   . ED (erectile dysfunction)   . Diabetes mellitus type II   . Hyperlipidemia   . COPD, moderate     on PFT 2010 due to previous 20y h/o smoking  . Sleep apnea   . Colon polyp   . Cancer 3/14    7/14 s/p XRT seeds in [place] ([facility])  . Atrial fibrillation     Past Surgical History  Procedure Laterality Date  . Back surgery    . Total knee arthroplasty Left 06-2012  . Vasectomy    .  Liver surgery      Family History  Problem Relation Age of Onset  . Diabetes Mother   . Heart disease Mother     Pacemaker  . Pancreatic cancer Father   . Hypertension Other   . Stomach cancer Father   . Stomach cancer Sister   . Colon cancer Neg Hx     History   Social History  . Marital Status: Married    Spouse Name: N/A    Number of Children: 2  . Years of Education: N/A   Occupational History  . [employer]     Retired  . teaching    Social History Main Topics  . Smoking status: Former Smoker    Types: Cigarettes    Quit date: 09/04/1999  . Smokeless tobacco: Never Used  . Alcohol Use: No  . Drug Use: No  . Sexual Activity: Yes   Other Topics Concern  . Not on file   Social History Narrative   Regular exercise- yes, walking    ROS:  As stated in the HPI and negative for all other systems.  PHYSICAL EXAM BP 104/66  Pulse 79  Ht 6' 2.5" (1.892  m)  Wt 244 lb (110.678 kg)  BMI 30.92 kg/m2  SpO2 96% GENERAL:  Well appearing HEENT:  Pupils equal round and reactive, fundi not visualized, oral mucosa unremarkable NECK:  No jugular venous distention, waveform within normal limits, carotid upstroke brisk and symmetric, no bruits, no thyromegaly LYMPHATICS:  No cervical, inguinal adenopathy LUNGS:  Clear to auscultation bilaterally BACK:  No CVA tenderness CHEST:  Unremarkable HEART:  PMI not displaced or sustained,S1 and S2 within normal limits, no S3, no S4, no clicks, no rubs, no murmurs ABD:  Flat, positive bowel sounds normal in frequency in pitch, no bruits, no rebound, no guarding, no midline pulsatile mass, no hepatomegaly, no splenomegaly,  Large stapled surgical wound EXT:  2 plus pulses throughout,  Mild to moderate bilateral lower extremityedema, no cyanosis no clubbing SKIN:  No rashes no nodules NEURO:  Cranial nerves II through XII grossly intact, motor grossly intact throughout PSYCH:  Cognitively intact, oriented to person place and time   ASSESSMENT AND PLAN   ATRIAL FIBRILLATION:  [name] has a CHA2DS2 - VASc score of 1 with a risk of stroke of 1.3%  and a HAS - BLED score of  And he is1 with a low risk of bleeding.   Current recommendations or for full dose aspirin however. He will remain on the beta blocker. He would let me know if he has any recurrent symptomatic fibrillation in the future and he understands that we are likely to see this.   EDEMA:  We talked about conservative therapy.  He will wear compression stockings.   HTN:  The blood pressure is at target. No change in medications is indicated. We will continue with therapeutic lifestyle changes (TLC).   He will keep a blood pressure diary as he might be able to cut back on his Azor  As he is on a beta blocker.

## 2013-06-23 NOTE — Patient Instructions (Signed)
The current medical regimen is effective;  continue present plan and medications.  Please wear compression stockings daily.  You may remove them at bedtime.  Please keep your feet and legs elevated above the level of your heart whenever possible.  Weigh yourself daily at the same time and in the same amount of clothing.  Notify MD of over-night weight gain of more than 3 lbs or 5 lbs in 2 days.  Follow up in 6 months with Dr Antoine Poche.  You will receive a letter in the mail 2 months before you are due.  Please call us when you receive this letter to schedule your follow up appointment.

## 2013-07-09 ENCOUNTER — Other Ambulatory Visit: Payer: Self-pay

## 2013-07-17 ENCOUNTER — Other Ambulatory Visit: Payer: Self-pay

## 2013-07-17 MED ORDER — TADALAFIL 20 MG PO TABS: 20.0000 mg | ORAL_TABLET | Freq: Every day | ORAL | Status: DC | PRN

## 2013-07-17 NOTE — Telephone Encounter (Signed)
Cialis refilled and sent electronically to [facility].

## 2013-07-20 ENCOUNTER — Ambulatory Visit (INDEPENDENT_AMBULATORY_CARE_PROVIDER_SITE_OTHER): Payer: BC Managed Care – PPO | Admitting: Internal Medicine

## 2013-07-20 ENCOUNTER — Encounter: Payer: Self-pay | Admitting: Internal Medicine

## 2013-07-20 ENCOUNTER — Other Ambulatory Visit (INDEPENDENT_AMBULATORY_CARE_PROVIDER_SITE_OTHER): Payer: BC Managed Care – PPO

## 2013-07-20 VITALS — BP 170/100 | HR 80 | Temp 96.7°F | Resp 16 | Wt 243.0 lb

## 2013-07-20 DIAGNOSIS — IMO0002 Reserved for concepts with insufficient information to code with codable children: Secondary | ICD-10-CM

## 2013-07-20 DIAGNOSIS — E119 Type 2 diabetes mellitus without complications: Secondary | ICD-10-CM

## 2013-07-20 DIAGNOSIS — I4891 Unspecified atrial fibrillation: Secondary | ICD-10-CM

## 2013-07-20 DIAGNOSIS — R160 Hepatomegaly, not elsewhere classified: Secondary | ICD-10-CM

## 2013-07-20 DIAGNOSIS — K769 Liver disease, unspecified: Secondary | ICD-10-CM

## 2013-07-20 DIAGNOSIS — C61 Malignant neoplasm of prostate: Secondary | ICD-10-CM

## 2013-07-20 DIAGNOSIS — I480 Paroxysmal atrial fibrillation: Secondary | ICD-10-CM

## 2013-07-20 DIAGNOSIS — M1710 Unilateral primary osteoarthritis, unspecified knee: Secondary | ICD-10-CM

## 2013-07-20 DIAGNOSIS — I10 Essential (primary) hypertension: Secondary | ICD-10-CM

## 2013-07-20 LAB — CBC WITH DIFFERENTIAL/PLATELET
Basophils Relative: 0.7 % (ref 0.0–3.0)
Eosinophils Relative: 2.4 % (ref 0.0–5.0)
Lymphocytes Relative: 24 % (ref 12.0–46.0)
MCV: 88.9 fl (ref 78.0–100.0)
Monocytes Absolute: 0.6 10*3/uL (ref 0.1–1.0)
Monocytes Relative: 10.4 % (ref 3.0–12.0)
Neutro Abs: 3.5 10*3/uL (ref 1.4–7.7)
Neutrophils Relative %: 62.5 % (ref 43.0–77.0)
Platelets: 294 10*3/uL (ref 150.0–400.0)
RBC: 3.87 Mil/uL — ABNORMAL LOW (ref 4.22–5.81)
WBC: 5.6 10*3/uL (ref 4.5–10.5)

## 2013-07-20 LAB — HEPATIC FUNCTION PANEL
AST: 20 U/L (ref 0–37)
Alkaline Phosphatase: 69 U/L (ref 39–117)
Bilirubin, Direct: 0.1 mg/dL (ref 0.0–0.3)
Total Protein: 7.5 g/dL (ref 6.0–8.3)

## 2013-07-20 LAB — HEMOGLOBIN A1C: Hgb A1c MFr Bld: 5.9 % (ref 4.6–6.5)

## 2013-07-20 LAB — BASIC METABOLIC PANEL
CO2: 27 mEq/L (ref 19–32)
Calcium: 9.3 mg/dL (ref 8.4–10.5)
Chloride: 106 mEq/L (ref 96–112)
Sodium: 141 mEq/L (ref 135–145)

## 2013-07-20 LAB — PSA: PSA: 0.98 ng/mL (ref 0.10–4.00)

## 2013-07-20 NOTE — Progress Notes (Signed)
Pre visit review using our clinic review tool, if applicable. No additional management support is needed unless otherwise documented below in the visit note. 

## 2013-07-20 NOTE — Assessment & Plan Note (Signed)
No relapse 

## 2013-07-20 NOTE — Assessment & Plan Note (Signed)
BP Readings from Last 3 Encounters:  07/20/13 170/100  06/23/13 104/66  05/25/13 130/80

## 2013-07-20 NOTE — Assessment & Plan Note (Signed)
Doing well 

## 2013-07-20 NOTE — Assessment & Plan Note (Signed)
PSA

## 2013-07-20 NOTE — Assessment & Plan Note (Signed)
10/14 s/p resection at [facility]

## 2013-07-20 NOTE — Progress Notes (Signed)
   Subjective:    HPI   The patient presents for a follow-up of  chronic hypertension, chronic dyslipidemia, type 2 diabetes controlled with medicines. F/u new prostate ca (s/p XRT seeds) and liver cyst (in the process of work up). He is s/p liver mass resection at [facility] - benign He is s/p L TKR 10/13 - better. No arrhythmias  BP Readings from Last 3 Encounters:  07/20/13 170/100  06/23/13 104/66  05/25/13 130/80   Wt Readings from Last 3 Encounters:  07/20/13 243 lb (110.224 kg)  06/23/13 244 lb (110.678 kg)  05/25/13 250 lb (113.399 kg)      Review of Systems  Constitutional: Negative for appetite change, fatigue and unexpected weight change.  HENT: Negative for congestion, nosebleeds, sneezing, sore throat and trouble swallowing.   Eyes: Negative for itching and visual disturbance.  Respiratory: Negative for cough.   Cardiovascular: Negative for chest pain, palpitations and leg swelling.  Gastrointestinal: Negative for nausea, diarrhea, blood in stool and abdominal distention.  Genitourinary: Negative for frequency and hematuria.  Musculoskeletal: Positive for gait problem and joint swelling (Knees, L heel). Negative for back pain and neck pain.  Skin: Negative for rash.  Neurological: Negative for dizziness, tremors, speech difficulty and weakness.  Psychiatric/Behavioral: Negative for sleep disturbance, dysphoric mood and agitation. The patient is not nervous/anxious.        Objective:   Physical Exam  Constitutional: He is oriented to person, place, and time. He appears well-developed.  HENT:  Mouth/Throat: Oropharynx is clear and moist.  Eyes: Conjunctivae are normal. Pupils are equal, round, and reactive to light.  Neck: Normal range of motion. No JVD present. No thyromegaly present.  Cardiovascular: Normal rate, regular rhythm, normal heart sounds and intact distal pulses.  Exam reveals no gallop and no friction rub.   No murmur heard. Pulmonary/Chest: Effort  normal and breath sounds normal. No respiratory distress. He has no wheezes. He has no rales. He exhibits no tenderness.  Abdominal: Soft. Bowel sounds are normal. He exhibits no distension and no mass. There is no tenderness. There is no rebound and no guarding.  Musculoskeletal: Normal range of motion. He exhibits no edema and no tenderness.  Lymphadenopathy:    He has no cervical adenopathy.  Neurological: He is alert and oriented to person, place, and time. He has normal reflexes. No cranial nerve deficit. He exhibits normal muscle tone. Coordination normal.  Skin: Skin is warm and dry. No rash noted.  Psychiatric: He has a normal mood and affect. His behavior is normal. Judgment and thought content normal.  Abd scar is healed - large DTRs ok MS is ok  L knee is better  Lab Results  Component Value Date   WBC 6.1 09/11/2012   HGB 12.8* 09/11/2012   HCT 38.7* 09/11/2012   PLT 428.0* 09/11/2012   GLUCOSE 132* 03/30/2013   CHOL 180 09/11/2012   TRIG 63.0 09/11/2012   HDL 46.30 09/11/2012   LDLDIRECT 125.4 07/14/2008   LDLCALC 121* 09/11/2012   ALT 16 03/30/2013   AST 19 03/30/2013   NA 140 03/30/2013   K 3.8 03/30/2013   CL 105 03/30/2013   CREATININE 1.1 03/30/2013   BUN 18 03/30/2013   CO2 27 03/30/2013   TSH 2.15 09/11/2012   PSA 1.90 03/30/2013   HGBA1C 6.2 03/30/2013        Assessment & Plan:

## 2013-07-20 NOTE — Assessment & Plan Note (Signed)
Labs  Continue with current prescription therapy as reflected on the Med list.  

## 2013-07-27 ENCOUNTER — Encounter (INDEPENDENT_AMBULATORY_CARE_PROVIDER_SITE_OTHER): Payer: Self-pay

## 2013-08-10 ENCOUNTER — Ambulatory Visit: Payer: BC Managed Care – PPO | Admitting: Internal Medicine

## 2013-10-16 ENCOUNTER — Other Ambulatory Visit: Payer: Self-pay | Admitting: *Deleted

## 2013-10-16 MED ORDER — PIOGLITAZONE HCL-METFORMIN HCL 15-850 MG PO TABS: 1.0000 | ORAL_TABLET | Freq: Two times a day (BID) | ORAL | Status: DC

## 2013-11-17 ENCOUNTER — Ambulatory Visit: Payer: BC Managed Care – PPO | Admitting: Internal Medicine

## 2014-03-08 ENCOUNTER — Telehealth: Payer: Self-pay | Admitting: *Deleted

## 2014-03-08 DIAGNOSIS — E119 Type 2 diabetes mellitus without complications: Secondary | ICD-10-CM

## 2014-03-08 NOTE — Telephone Encounter (Signed)
Appointment made for diabetic follow up.  Lipid, bmet, a1c ordered. Diabetic bundle.

## 2014-03-17 ENCOUNTER — Other Ambulatory Visit (INDEPENDENT_AMBULATORY_CARE_PROVIDER_SITE_OTHER): Payer: BC Managed Care – PPO

## 2014-03-17 ENCOUNTER — Ambulatory Visit (INDEPENDENT_AMBULATORY_CARE_PROVIDER_SITE_OTHER): Payer: BC Managed Care – PPO | Admitting: Internal Medicine

## 2014-03-17 ENCOUNTER — Encounter: Payer: Self-pay | Admitting: Internal Medicine

## 2014-03-17 VITALS — BP 138/82 | HR 80 | Temp 98.6°F | Resp 16 | Wt 253.0 lb

## 2014-03-17 DIAGNOSIS — J309 Allergic rhinitis, unspecified: Secondary | ICD-10-CM | POA: Insufficient documentation

## 2014-03-17 DIAGNOSIS — K769 Liver disease, unspecified: Secondary | ICD-10-CM

## 2014-03-17 DIAGNOSIS — J3089 Other allergic rhinitis: Secondary | ICD-10-CM

## 2014-03-17 DIAGNOSIS — I10 Essential (primary) hypertension: Secondary | ICD-10-CM

## 2014-03-17 DIAGNOSIS — E785 Hyperlipidemia, unspecified: Secondary | ICD-10-CM

## 2014-03-17 DIAGNOSIS — R160 Hepatomegaly, not elsewhere classified: Secondary | ICD-10-CM

## 2014-03-17 DIAGNOSIS — C61 Malignant neoplasm of prostate: Secondary | ICD-10-CM

## 2014-03-17 DIAGNOSIS — E119 Type 2 diabetes mellitus without complications: Secondary | ICD-10-CM

## 2014-03-17 LAB — HEPATIC FUNCTION PANEL
ALBUMIN: 3.6 g/dL (ref 3.5–5.2)
ALT: 14 U/L (ref 0–53)
AST: 21 U/L (ref 0–37)
Alkaline Phosphatase: 66 U/L (ref 39–117)
BILIRUBIN TOTAL: 0.8 mg/dL (ref 0.2–1.2)
Bilirubin, Direct: 0.2 mg/dL (ref 0.0–0.3)
Total Protein: 7 g/dL (ref 6.0–8.3)

## 2014-03-17 LAB — CBC WITH DIFFERENTIAL/PLATELET
Basophils Absolute: 0 10*3/uL (ref 0.0–0.1)
Basophils Relative: 0.4 % (ref 0.0–3.0)
EOS ABS: 0.1 10*3/uL (ref 0.0–0.7)
EOS PCT: 2.9 % (ref 0.0–5.0)
HCT: 41.7 % (ref 39.0–52.0)
Hemoglobin: 13.6 g/dL (ref 13.0–17.0)
LYMPHS PCT: 21.1 % (ref 12.0–46.0)
Lymphs Abs: 1 10*3/uL (ref 0.7–4.0)
MCHC: 32.7 g/dL (ref 30.0–36.0)
MCV: 92.2 fl (ref 78.0–100.0)
MONOS PCT: 9.6 % (ref 3.0–12.0)
Monocytes Absolute: 0.5 10*3/uL (ref 0.1–1.0)
NEUTROS ABS: 3.1 10*3/uL (ref 1.4–7.7)
NEUTROS PCT: 66 % (ref 43.0–77.0)
Platelets: 270 10*3/uL (ref 150.0–400.0)
RBC: 4.52 Mil/uL (ref 4.22–5.81)
RDW: 14.2 % (ref 11.5–15.5)
WBC: 4.7 10*3/uL (ref 4.0–10.5)

## 2014-03-17 LAB — URIC ACID: URIC ACID, SERUM: 6.3 mg/dL (ref 4.0–7.8)

## 2014-03-17 LAB — IBC PANEL
Iron: 61 ug/dL (ref 42–165)
Saturation Ratios: 15.1 % — ABNORMAL LOW (ref 20.0–50.0)
TRANSFERRIN: 288.8 mg/dL (ref 212.0–360.0)

## 2014-03-17 LAB — PSA: PSA: 0.5 ng/mL (ref 0.10–4.00)

## 2014-03-17 MED ORDER — LORATADINE 10 MG PO TABS: 10.0000 mg | ORAL_TABLET | Freq: Every day | ORAL | Status: DC

## 2014-03-17 MED ORDER — PIOGLITAZONE HCL-METFORMIN HCL 15-850 MG PO TABS: 1.0000 | ORAL_TABLET | Freq: Two times a day (BID) | ORAL | Status: DC

## 2014-03-17 MED ORDER — IBUPROFEN 600 MG PO TABS: ORAL_TABLET | ORAL | Status: DC

## 2014-03-17 MED ORDER — FLUTICASONE PROPIONATE 50 MCG/ACT NA SUSP: 2.0000 | Freq: Every day | NASAL | Status: DC

## 2014-03-17 NOTE — Progress Notes (Signed)
Patient ID: [name] [name], male   DOB: Apr 16, 1951, 63 y.o.   MRN: [id number]   Subjective:    HPI   The patient presents for a follow-up of  chronic hypertension, chronic dyslipidemia, type 2 diabetes controlled with medicines. F/u new prostate ca (s/p XRT seeds) and liver cyst (in the process of work up). He is s/p liver mass resection at [facility] - benign He is s/p L TKR 10/13 - better. No arrhythmias  BP Readings from Last 3 Encounters:  03/17/14 138/82  07/20/13 170/100  06/23/13 104/66   Wt Readings from Last 3 Encounters:  03/17/14 253 lb (114.76 kg)  07/20/13 243 lb (110.224 kg)  06/23/13 244 lb (110.678 kg)      Review of Systems  Constitutional: Negative for appetite change, fatigue and unexpected weight change.  HENT: Negative for congestion, nosebleeds, sneezing, sore throat and trouble swallowing.   Eyes: Negative for itching and visual disturbance.  Respiratory: Negative for cough.   Cardiovascular: Negative for chest pain, palpitations and leg swelling.  Gastrointestinal: Negative for nausea, diarrhea, blood in stool and abdominal distention.  Genitourinary: Negative for frequency and hematuria.  Musculoskeletal: Positive for gait problem and joint swelling (Knees, L heel). Negative for back pain and neck pain.  Skin: Negative for rash.  Neurological: Negative for dizziness, tremors, speech difficulty and weakness.  Psychiatric/Behavioral: Negative for sleep disturbance, dysphoric mood and agitation. The patient is not nervous/anxious.        Objective:   Physical Exam  Constitutional: He is oriented to person, place, and time. He appears well-developed.  HENT:  Mouth/Throat: Oropharynx is clear and moist.  Eyes: Conjunctivae are normal. Pupils are equal, round, and reactive to light.  Neck: Normal range of motion. No JVD present. No thyromegaly present.  Cardiovascular: Normal rate, regular rhythm, normal heart sounds and intact distal pulses.  Exam reveals  no gallop and no friction rub.   No murmur heard. Pulmonary/Chest: Effort normal and breath sounds normal. No respiratory distress. He has no wheezes. He has no rales. He exhibits no tenderness.  Abdominal: Soft. Bowel sounds are normal. He exhibits no distension and no mass. There is no tenderness. There is no rebound and no guarding.  Musculoskeletal: Normal range of motion. He exhibits no edema and no tenderness.  Lymphadenopathy:    He has no cervical adenopathy.  Neurological: He is alert and oriented to person, place, and time. He has normal reflexes. No cranial nerve deficit. He exhibits normal muscle tone. Coordination normal.  Skin: Skin is warm and dry. No rash noted.  Psychiatric: He has a normal mood and affect. His behavior is normal. Judgment and thought content normal.  Abd scar is healed - large   Lab Results  Component Value Date   WBC 5.6 07/20/2013   HGB 11.3* 07/20/2013   HCT 34.4* 07/20/2013   PLT 294.0 07/20/2013   GLUCOSE 86 07/20/2013   CHOL 180 09/11/2012   TRIG 63.0 09/11/2012   HDL 46.30 09/11/2012   LDLDIRECT 125.4 07/14/2008   LDLCALC 121* 09/11/2012   ALT 22 07/20/2013   AST 20 07/20/2013   NA 141 07/20/2013   K 3.6 07/20/2013   CL 106 07/20/2013   CREATININE 1.0 07/20/2013   BUN 12 07/20/2013   CO2 27 07/20/2013   TSH 2.15 09/11/2012   PSA 0.98 07/20/2013   HGBA1C 5.9 07/20/2013        Assessment & Plan:

## 2014-03-17 NOTE — Assessment & Plan Note (Signed)
Doing well - f/u w/r Tresa Moore in September

## 2014-03-17 NOTE — Assessment & Plan Note (Signed)
Labs

## 2014-03-17 NOTE — Assessment & Plan Note (Signed)
Continue with current prescription therapy as reflected on the Med list.  

## 2014-03-17 NOTE — Assessment & Plan Note (Signed)
LFTs 

## 2014-03-17 NOTE — Assessment & Plan Note (Signed)
LABS

## 2014-03-17 NOTE — Progress Notes (Signed)
Pre visit review using our clinic review tool, if applicable. No additional management support is needed unless otherwise documented below in the visit note. 

## 2014-03-17 NOTE — Patient Instructions (Signed)
   Milk free trial (no milk, ice cream, cheese and yogurt) for 4-6 weeks. OK to use almond, coconut, rice or soy milk. "Almond breeze" brand tastes good.  

## 2014-03-17 NOTE — Assessment & Plan Note (Signed)
Milk free diet Lorat, Flonase

## 2014-03-22 ENCOUNTER — Other Ambulatory Visit: Payer: Self-pay

## 2014-03-22 MED ORDER — AMLODIPINE-OLMESARTAN 10-40 MG PO TABS: 1.0000 | ORAL_TABLET | Freq: Every day | ORAL | Status: DC

## 2014-05-07 ENCOUNTER — Encounter: Payer: Self-pay | Admitting: Internal Medicine

## 2014-07-13 ENCOUNTER — Ambulatory Visit (INDEPENDENT_AMBULATORY_CARE_PROVIDER_SITE_OTHER): Payer: BC Managed Care – PPO | Admitting: Internal Medicine

## 2014-07-13 ENCOUNTER — Encounter: Payer: Self-pay | Admitting: Internal Medicine

## 2014-07-13 ENCOUNTER — Other Ambulatory Visit (INDEPENDENT_AMBULATORY_CARE_PROVIDER_SITE_OTHER): Payer: BC Managed Care – PPO

## 2014-07-13 VITALS — BP 140/78 | HR 63 | Temp 98.5°F | Wt 259.0 lb

## 2014-07-13 DIAGNOSIS — E118 Type 2 diabetes mellitus with unspecified complications: Secondary | ICD-10-CM

## 2014-07-13 DIAGNOSIS — I517 Cardiomegaly: Secondary | ICD-10-CM

## 2014-07-13 DIAGNOSIS — R609 Edema, unspecified: Secondary | ICD-10-CM

## 2014-07-13 DIAGNOSIS — Z9989 Dependence on other enabling machines and devices: Principal | ICD-10-CM

## 2014-07-13 DIAGNOSIS — E119 Type 2 diabetes mellitus without complications: Secondary | ICD-10-CM

## 2014-07-13 DIAGNOSIS — G4733 Obstructive sleep apnea (adult) (pediatric): Secondary | ICD-10-CM

## 2014-07-13 DIAGNOSIS — I10 Essential (primary) hypertension: Secondary | ICD-10-CM

## 2014-07-13 LAB — LIPID PANEL
CHOL/HDL RATIO: 4
Cholesterol: 185 mg/dL (ref 0–200)
HDL: 45.5 mg/dL (ref >39.00)
LDL CALC: 128 mg/dL — AB (ref 0–99)
NonHDL: 139.5
Triglycerides: 58 mg/dL (ref 0.0–149.0)
VLDL: 11.6 mg/dL (ref 0.0–40.0)

## 2014-07-13 LAB — BASIC METABOLIC PANEL
BUN: 18 mg/dL (ref 6–23)
CALCIUM: 9 mg/dL (ref 8.4–10.5)
CO2: 33 mEq/L — ABNORMAL HIGH (ref 19–32)
Chloride: 108 mEq/L (ref 96–112)
Creatinine, Ser: 1 mg/dL (ref 0.4–1.5)
GFR: 98.77 mL/min (ref >60.00)
Glucose, Bld: 107 mg/dL — ABNORMAL HIGH (ref 70–99)
Potassium: 3.6 mEq/L (ref 3.5–5.1)
SODIUM: 143 meq/L (ref 135–145)

## 2014-07-13 LAB — HEMOGLOBIN A1C: Hgb A1c MFr Bld: 6 % (ref 4.6–6.5)

## 2014-07-13 MED ORDER — FUROSEMIDE 20 MG PO TABS: 20.0000 mg | ORAL_TABLET | Freq: Every day | ORAL | Status: DC | PRN

## 2014-07-13 NOTE — Assessment & Plan Note (Signed)
11/15 mild B- multifactorial See Rx prn Compr socks

## 2014-07-13 NOTE — Assessment & Plan Note (Signed)
Continue with current prescription therapy as reflected on the Med list.  

## 2014-07-13 NOTE — Progress Notes (Signed)
Pre visit review using our clinic review tool, if applicable. No additional management support is needed unless otherwise documented below in the visit note. 

## 2014-07-13 NOTE — Assessment & Plan Note (Signed)
On CPAP. ?

## 2014-07-13 NOTE — Progress Notes (Signed)
Subjective:    HPI  C/o leg swelling at night x 1 mo  The patient presents for a follow-up of  chronic hypertension, chronic dyslipidemia, type 2 diabetes controlled with medicines. F/u new prostate ca (s/p XRT seeds 2014) and liver cyst (in the process of work up). He is s/p liver mass resection at [facility] - benign 2014. On CPAP for OSA  He is s/p L TKR 10/13 - better. No arrhythmias  BP Readings from Last 3 Encounters:  07/13/14 140/78  03/17/14 138/82  07/20/13 170/100   Wt Readings from Last 3 Encounters:  07/13/14 259 lb (117.482 kg)  03/17/14 253 lb (114.76 kg)  07/20/13 243 lb (110.224 kg)      Review of Systems  Constitutional: Negative for appetite change, fatigue and unexpected weight change.  HENT: Negative for congestion, nosebleeds, sneezing, sore throat and trouble swallowing.   Eyes: Negative for itching and visual disturbance.  Respiratory: Negative for cough.   Cardiovascular: Negative for chest pain, palpitations and leg swelling.  Gastrointestinal: Negative for nausea, diarrhea, blood in stool and abdominal distention.  Genitourinary: Negative for frequency and hematuria.  Musculoskeletal: Positive for joint swelling (Knees, L heel) and gait problem. Negative for back pain and neck pain.  Skin: Negative for rash.  Neurological: Negative for dizziness, tremors, speech difficulty and weakness.  Psychiatric/Behavioral: Negative for sleep disturbance, dysphoric mood and agitation. The patient is not nervous/anxious.        Objective:   Physical Exam  Constitutional: He is oriented to person, place, and time. He appears well-developed. No distress.  NAD  HENT:  Mouth/Throat: Oropharynx is clear and moist.  Eyes: Conjunctivae are normal. Pupils are equal, round, and reactive to light.  Neck: Normal range of motion. No JVD present. No thyromegaly present.  Cardiovascular: Normal rate, regular rhythm, normal heart sounds and intact distal pulses.  Exam  reveals no gallop and no friction rub.   No murmur heard. Pulmonary/Chest: Effort normal and breath sounds normal. No respiratory distress. He has no wheezes. He has no rales. He exhibits no tenderness.  Abdominal: Soft. Bowel sounds are normal. He exhibits no distension and no mass. There is no tenderness. There is no rebound and no guarding.  Musculoskeletal: Normal range of motion. He exhibits edema. He exhibits no tenderness.  Lymphadenopathy:    He has no cervical adenopathy.  Neurological: He is alert and oriented to person, place, and time. He has normal reflexes. No cranial nerve deficit. He exhibits normal muscle tone. He displays a negative Romberg sign. Coordination and gait normal.  No meningeal signs  Skin: Skin is warm and dry. No rash noted.  Psychiatric: He has a normal mood and affect. His behavior is normal. Judgment and thought content normal.  Abd scar is healed - large B edema none to trace  Lab Results  Component Value Date   WBC 4.7 03/17/2014   HGB 13.6 03/17/2014   HCT 41.7 03/17/2014   PLT 270.0 03/17/2014   GLUCOSE 86 07/20/2013   CHOL 180 09/11/2012   TRIG 63.0 09/11/2012   HDL 46.30 09/11/2012   LDLDIRECT 125.4 07/14/2008   LDLCALC 121* 09/11/2012   ALT 14 03/17/2014   AST 21 03/17/2014   NA 141 07/20/2013   K 3.6 07/20/2013   CL 106 07/20/2013   CREATININE 1.0 07/20/2013   BUN 12 07/20/2013   CO2 27 07/20/2013   TSH 2.15 09/11/2012   PSA 0.50 03/17/2014   HGBA1C 5.9 07/20/2013  Assessment & Plan:

## 2014-09-30 ENCOUNTER — Observation Stay (HOSPITAL_COMMUNITY)
Admission: EM | Admit: 2014-09-30 | Discharge: 2014-10-01 | Disposition: A | Discharge status: Home / Self Care | Payer: Medicare PPO | Attending: Internal Medicine | Admitting: Internal Medicine

## 2014-09-30 ENCOUNTER — Observation Stay (HOSPITAL_COMMUNITY): Payer: Medicare PPO

## 2014-09-30 ENCOUNTER — Encounter (HOSPITAL_COMMUNITY): Payer: Self-pay | Admitting: Adult Health

## 2014-09-30 ENCOUNTER — Emergency Department (HOSPITAL_COMMUNITY): Payer: Medicare PPO

## 2014-09-30 DIAGNOSIS — M545 Low back pain: Secondary | ICD-10-CM | POA: Diagnosis not present

## 2014-09-30 DIAGNOSIS — E785 Hyperlipidemia, unspecified: Secondary | ICD-10-CM | POA: Diagnosis not present

## 2014-09-30 DIAGNOSIS — Z9981 Dependence on supplemental oxygen: Secondary | ICD-10-CM | POA: Diagnosis not present

## 2014-09-30 DIAGNOSIS — Z0000 Encounter for general adult medical examination without abnormal findings: Secondary | ICD-10-CM

## 2014-09-30 DIAGNOSIS — I480 Paroxysmal atrial fibrillation: Secondary | ICD-10-CM | POA: Diagnosis present

## 2014-09-30 DIAGNOSIS — E119 Type 2 diabetes mellitus without complications: Secondary | ICD-10-CM | POA: Insufficient documentation

## 2014-09-30 DIAGNOSIS — M1710 Unilateral primary osteoarthritis, unspecified knee: Secondary | ICD-10-CM

## 2014-09-30 DIAGNOSIS — R160 Hepatomegaly, not elsewhere classified: Secondary | ICD-10-CM | POA: Diagnosis present

## 2014-09-30 DIAGNOSIS — Z79899 Other long term (current) drug therapy: Secondary | ICD-10-CM | POA: Insufficient documentation

## 2014-09-30 DIAGNOSIS — R0683 Snoring: Secondary | ICD-10-CM

## 2014-09-30 DIAGNOSIS — Z7951 Long term (current) use of inhaled steroids: Secondary | ICD-10-CM | POA: Diagnosis not present

## 2014-09-30 DIAGNOSIS — Z86010 Personal history of colonic polyps: Secondary | ICD-10-CM | POA: Diagnosis not present

## 2014-09-30 DIAGNOSIS — M1990 Unspecified osteoarthritis, unspecified site: Secondary | ICD-10-CM | POA: Insufficient documentation

## 2014-09-30 DIAGNOSIS — J449 Chronic obstructive pulmonary disease, unspecified: Secondary | ICD-10-CM | POA: Insufficient documentation

## 2014-09-30 DIAGNOSIS — I10 Essential (primary) hypertension: Secondary | ICD-10-CM

## 2014-09-30 DIAGNOSIS — G4730 Sleep apnea, unspecified: Secondary | ICD-10-CM | POA: Insufficient documentation

## 2014-09-30 DIAGNOSIS — I4891 Unspecified atrial fibrillation: Secondary | ICD-10-CM | POA: Diagnosis not present

## 2014-09-30 DIAGNOSIS — R059 Cough, unspecified: Secondary | ICD-10-CM

## 2014-09-30 DIAGNOSIS — K921 Melena: Secondary | ICD-10-CM

## 2014-09-30 DIAGNOSIS — N529 Male erectile dysfunction, unspecified: Secondary | ICD-10-CM | POA: Diagnosis not present

## 2014-09-30 DIAGNOSIS — Z87891 Personal history of nicotine dependence: Secondary | ICD-10-CM

## 2014-09-30 DIAGNOSIS — F528 Other sexual dysfunction not due to a substance or known physiological condition: Secondary | ICD-10-CM

## 2014-09-30 DIAGNOSIS — Z859 Personal history of malignant neoplasm, unspecified: Secondary | ICD-10-CM | POA: Diagnosis not present

## 2014-09-30 DIAGNOSIS — K625 Hemorrhage of anus and rectum: Secondary | ICD-10-CM

## 2014-09-30 DIAGNOSIS — Z7982 Long term (current) use of aspirin: Secondary | ICD-10-CM | POA: Diagnosis not present

## 2014-09-30 DIAGNOSIS — R609 Edema, unspecified: Secondary | ICD-10-CM

## 2014-09-30 DIAGNOSIS — R310 Gross hematuria: Secondary | ICD-10-CM

## 2014-09-30 DIAGNOSIS — Z9989 Dependence on other enabling machines and devices: Secondary | ICD-10-CM

## 2014-09-30 DIAGNOSIS — G4733 Obstructive sleep apnea (adult) (pediatric): Secondary | ICD-10-CM

## 2014-09-30 DIAGNOSIS — R55 Syncope and collapse: Secondary | ICD-10-CM

## 2014-09-30 DIAGNOSIS — I517 Cardiomegaly: Secondary | ICD-10-CM

## 2014-09-30 DIAGNOSIS — C61 Malignant neoplasm of prostate: Secondary | ICD-10-CM

## 2014-09-30 DIAGNOSIS — K7689 Other specified diseases of liver: Secondary | ICD-10-CM

## 2014-09-30 DIAGNOSIS — R05 Cough: Secondary | ICD-10-CM

## 2014-09-30 DIAGNOSIS — R079 Chest pain, unspecified: Principal | ICD-10-CM

## 2014-09-30 DIAGNOSIS — R202 Paresthesia of skin: Secondary | ICD-10-CM

## 2014-09-30 DIAGNOSIS — M179 Osteoarthritis of knee, unspecified: Secondary | ICD-10-CM | POA: Diagnosis present

## 2014-09-30 DIAGNOSIS — E118 Type 2 diabetes mellitus with unspecified complications: Secondary | ICD-10-CM

## 2014-09-30 DIAGNOSIS — R269 Unspecified abnormalities of gait and mobility: Secondary | ICD-10-CM

## 2014-09-30 DIAGNOSIS — F22 Delusional disorders: Secondary | ICD-10-CM

## 2014-09-30 DIAGNOSIS — E876 Hypokalemia: Secondary | ICD-10-CM

## 2014-09-30 LAB — URINALYSIS, ROUTINE W REFLEX MICROSCOPIC
Bilirubin Urine: NEGATIVE
GLUCOSE, UA: NEGATIVE mg/dL
Hgb urine dipstick: NEGATIVE
Ketones, ur: NEGATIVE mg/dL
LEUKOCYTES UA: NEGATIVE
NITRITE: NEGATIVE
Protein, ur: NEGATIVE mg/dL
SPECIFIC GRAVITY, URINE: 1.012 (ref 1.005–1.030)
Urobilinogen, UA: 0.2 mg/dL (ref 0.0–1.0)
pH: 7 (ref 5.0–8.0)

## 2014-09-30 LAB — BASIC METABOLIC PANEL
ANION GAP: 7 (ref 5–15)
BUN: 18 mg/dL (ref 6–23)
CALCIUM: 9.6 mg/dL (ref 8.4–10.5)
CO2: 28 mmol/L (ref 19–32)
CREATININE: 1.07 mg/dL (ref 0.50–1.35)
Chloride: 104 mmol/L (ref 96–112)
GFR calc Af Amer: 82 mL/min — ABNORMAL LOW (ref >90)
GFR calc non Af Amer: 71 mL/min — ABNORMAL LOW (ref >90)
Glucose, Bld: 137 mg/dL — ABNORMAL HIGH (ref 70–99)
Potassium: 3.6 mmol/L (ref 3.5–5.1)
SODIUM: 139 mmol/L (ref 135–145)

## 2014-09-30 LAB — CBC
HCT: 41.7 % (ref 39.0–52.0)
HEMOGLOBIN: 14.1 g/dL (ref 13.0–17.0)
MCH: 29.8 pg (ref 26.0–34.0)
MCHC: 33.8 g/dL (ref 30.0–36.0)
MCV: 88.2 fL (ref 78.0–100.0)
Platelets: 264 10*3/uL (ref 150–400)
RBC: 4.73 MIL/uL (ref 4.22–5.81)
RDW: 13.5 % (ref 11.5–15.5)
WBC: 6 10*3/uL (ref 4.0–10.5)

## 2014-09-30 LAB — TROPONIN I: Troponin I: <0.03 ng/mL (ref <0.031)

## 2014-09-30 LAB — GLUCOSE, CAPILLARY
Glucose-Capillary: 78 mg/dL (ref 70–99)
Glucose-Capillary: 88 mg/dL (ref 70–99)
Glucose-Capillary: 124 mg/dL — ABNORMAL HIGH (ref 70–99)

## 2014-09-30 LAB — I-STAT TROPONIN, ED: Troponin i, poc: 0 ng/mL (ref 0.00–0.08)

## 2014-09-30 MED ORDER — ENOXAPARIN SODIUM 40 MG/0.4ML ~~LOC~~ SOLN
40.0000 mg | SUBCUTANEOUS | Status: DC
  Administered 2014-09-30 – 2014-10-01 (×2): 40 mg via SUBCUTANEOUS
  Filled 2014-09-30 (×2): qty 0.4

## 2014-09-30 MED ORDER — INSULIN ASPART 100 UNIT/ML ~~LOC~~ SOLN: 0.0000 [IU] | SUBCUTANEOUS | Status: DC

## 2014-09-30 MED ORDER — TECHNETIUM TC 99M SESTAMIBI GENERIC - CARDIOLITE: 10.0000 | Freq: Once | INTRAVENOUS | Status: AC | PRN

## 2014-09-30 MED ORDER — NITROGLYCERIN 0.4 MG SL SUBL: 0.4000 mg | SUBLINGUAL_TABLET | SUBLINGUAL | Status: DC | PRN

## 2014-09-30 MED ORDER — TAMSULOSIN HCL 0.4 MG PO CAPS
0.4000 mg | ORAL_CAPSULE | Freq: Every day | ORAL | Status: DC
  Administered 2014-09-30 – 2014-10-01 (×2): 0.4 mg via ORAL
  Filled 2014-09-30 (×2): qty 1

## 2014-09-30 MED ORDER — FENTANYL CITRATE 0.05 MG/ML IJ SOLN
50.0000 ug | Freq: Once | INTRAMUSCULAR | Status: DC
  Filled 2014-09-30: qty 2

## 2014-09-30 MED ORDER — METOPROLOL TARTRATE 50 MG PO TABS
50.0000 mg | ORAL_TABLET | Freq: Two times a day (BID) | ORAL | Status: DC
  Administered 2014-09-30 (×2): 50 mg via ORAL
  Filled 2014-09-30 (×2): qty 1

## 2014-09-30 MED ORDER — IRBESARTAN 150 MG PO TABS
300.0000 mg | ORAL_TABLET | Freq: Every day | ORAL | Status: DC
  Administered 2014-09-30 – 2014-10-01 (×2): 300 mg via ORAL
  Filled 2014-09-30 (×3): qty 2

## 2014-09-30 MED ORDER — ASPIRIN EC 325 MG PO TBEC
325.0000 mg | DELAYED_RELEASE_TABLET | Freq: Every day | ORAL | Status: DC
  Administered 2014-10-01: 325 mg via ORAL
  Filled 2014-09-30: qty 1

## 2014-09-30 MED ORDER — ONDANSETRON HCL 4 MG/2ML IJ SOLN: 4.0000 mg | Freq: Four times a day (QID) | INTRAMUSCULAR | Status: DC | PRN

## 2014-09-30 MED ORDER — ADULT MULTIVITAMIN W/MINERALS CH
1.0000 | ORAL_TABLET | Freq: Every day | ORAL | Status: DC
  Administered 2014-09-30 – 2014-10-01 (×2): 1 via ORAL
  Filled 2014-09-30 (×3): qty 1

## 2014-09-30 MED ORDER — ACETAMINOPHEN 325 MG PO TABS: 650.0000 mg | ORAL_TABLET | ORAL | Status: DC | PRN

## 2014-09-30 MED ORDER — AMLODIPINE BESYLATE 10 MG PO TABS
10.0000 mg | ORAL_TABLET | Freq: Every day | ORAL | Status: DC
  Administered 2014-09-30 – 2014-10-01 (×2): 10 mg via ORAL
  Filled 2014-09-30 (×2): qty 1

## 2014-09-30 MED ORDER — REGADENOSON 0.4 MG/5ML IV SOLN
0.4000 mg | Freq: Once | INTRAVENOUS | Status: DC
  Filled 2014-09-30: qty 5

## 2014-09-30 MED ORDER — FLUTICASONE PROPIONATE 50 MCG/ACT NA SUSP
2.0000 | Freq: Every day | NASAL | Status: DC
  Filled 2014-09-30: qty 16

## 2014-09-30 MED ORDER — AMLODIPINE-OLMESARTAN 10-40 MG PO TABS: 1.0000 | ORAL_TABLET | Freq: Every day | ORAL | Status: DC

## 2014-09-30 MED ORDER — LORATADINE 10 MG PO TABS: 10.0000 mg | ORAL_TABLET | Freq: Every day | ORAL | Status: DC

## 2014-09-30 MED ORDER — FUROSEMIDE 20 MG PO TABS: 20.0000 mg | ORAL_TABLET | Freq: Every day | ORAL | Status: DC | PRN

## 2014-09-30 MED ORDER — NITROGLYCERIN 0.4 MG SL SUBL
0.4000 mg | SUBLINGUAL_TABLET | SUBLINGUAL | Status: DC | PRN
  Administered 2014-09-30 (×2): 0.4 mg via SUBLINGUAL
  Filled 2014-09-30: qty 1

## 2014-09-30 MED ORDER — ASPIRIN 325 MG PO TABS
325.0000 mg | ORAL_TABLET | ORAL | Status: AC
  Administered 2014-09-30: 325 mg via ORAL
  Filled 2014-09-30: qty 1

## 2014-09-30 MED ORDER — REGADENOSON 0.4 MG/5ML IV SOLN
INTRAVENOUS | Status: AC
  Filled 2014-09-30: qty 5

## 2014-09-30 NOTE — ED Notes (Signed)
Pt reports he does not want any more nitroglycerin at this time.

## 2014-09-30 NOTE — H&P (Signed)
[facility] History and Physical  [name] [name]:[id number] DOB: 1951-08-07 DOA: 09/30/2014  Referring physician: ED PCP: Walker Kehr, MD   Chief Complaint:  Chest pain  HPI:  64 year old obese male with history of hypertension, type 2 diabetes mellitus, hyperlipidemia (not on any medications), paroxysmal A. fib, COPD (quit smoking over 10 years ago with 20-pack-year smoking history), OSA on CPAP at night, history of prostate cancer with liver mass status post XRT seeds in [place] in 2014, liver mass status post resection, osteoarthritis of the knee, chronic low back  pain and erectile dysfunction who presented to the ED early this morning with acute onset of chest pain at rest. Patient reports going out to dinner at [facility] and return home last evening. When he went to bed around 11 PM he started having left-sided chest pain which was squeezing in nature and radiating to his left neck and felt like he was unable to swallow. The pain was 8/10 in intensity without any aggravating or relieving factors. This was associated with shortness of breath and some palpitations. Patient took his full dose aspirin without any relief. Pain lasted for several minutes until he came to the ED. Patient was given another full dose aspirin without any relief and then was given sublingual nitroglycerin which reduced the pain to 5/10 and then a second dose after which the pain subsided.  Patient reports having similar pain when he had his liver surgery. Patient had a 2-D echo in 2014 which showed EF of 55-60% with grade 2 diastolic dysfunction and septal hypertrophy. He also had a stress test in September 2014 which was a normal study.   Patient reports mild headache when the chest pain started associated with shortness of breath and palpitations. Patient denies  dizziness, fever, chills, nausea , vomiting,  abdominal pain, bowel or urinary symptoms. He reports occasional leg swellings for his which  he takes Lasix as needed. Denies change in weight or appetite. Patient works as an Quarry manager at [employer] and reports being fairly active with his day to day activities. Denies recent illness or travel. Denies any change in his medications recently.  Course in the ED Patient's vitals were stable. Blood work done was unremarkable with negative initial troponin. EKG showed normal sinus rhythm with no ST-T changes. Patient had no chest pain on my evaluation. Hospitalist admission requested on observation.   Review of Systems:  Constitutional: Denies fever, chills, diaphoresis, appetite change and fatigue.  HEENT: Denies visual or hearing symptoms, congestion, sore throat,   trouble swallowing, neck pain,  Respiratory: Shortness of breath +, Denies cough,and wheezing.   Cardiovascular: chest pain, palpitations, denies eg swelling.  Gastrointestinal: Denies nausea, vomiting, abdominal pain, diarrhea, constipation, blood in stool and abdominal distention.  Genitourinary: Denies dysuria,  hematuria, flank pain and difficulty urinating.  Endocrine: Denies: hot or cold intolerance, sweats,polyuria, polydipsia. Musculoskeletal: Denies myalgias, back pain, knee joint pain+ Skin: Denies  rash and wound.  Neurological: Headache +, Denies dizziness,  syncope, weakness, light-headedness, numbness  Hematological: Denies adenopathy.  Psychiatric/Behavioral: Denies confusion   Past Medical History  Diagnosis Date  . Hypertension   . LBP (low back pain)   . Osteoarthritis   . ED (erectile dysfunction)   . Diabetes mellitus type II   . Hyperlipidemia   . COPD, moderate     on PFT 2010 due to previous 20y h/o smoking  . Sleep apnea   . Colon polyp   . Cancer 3/14    7/14 s/p XRT  seeds in [place] ([facility] [place])  . Atrial fibrillation    Past Surgical History  Procedure Laterality Date  . Back surgery    . Total knee arthroplasty Left 06-2012  . Vasectomy    . Liver surgery      Social History:  reports that he quit smoking about 15 years ago. His smoking use included Cigarettes. He has never used smokeless tobacco. He reports that he does not drink alcohol or use illicit drugs.  Allergies  Allergen Reactions  . Benazepril Hcl Other (See Comments) and Cough  . Sildenafil     Unknown    Family History  Problem Relation Age of Onset  . Diabetes Mother   . Heart disease Mother     Pacemaker  . Pancreatic cancer Father   . Hypertension Other   . Stomach cancer Father   . Stomach cancer Sister   . Colon cancer Neg Hx     Prior to Admission medications   Medication Sig Start Date End Date Taking? Authorizing Provider  amLODipine-olmesartan (AZOR) 10-40 MG per tablet Take 1 tablet by mouth daily. 03/22/14  Yes Aleksei Plotnikov V, MD  aspirin (BAYER ASPIRIN) 325 MG tablet Take 1 tablet (325 mg total) by mouth daily. 05/25/13  Yes Aleksei Plotnikov V, MD  diclofenac sodium (VOLTAREN) 1 % GEL Apply 1 application topically 4 (four) times daily. 03/15/11  Yes Aleksei Plotnikov V, MD  fluticasone (FLONASE) 50 MCG/ACT nasal spray Place 2 sprays into both nostrils daily. 03/17/14  Yes Aleksei Plotnikov V, MD  furosemide (LASIX) 20 MG tablet Take 1-2 tablets (20-40 mg total) by mouth daily as needed for edema. 07/13/14  Yes Aleksei Plotnikov V, MD  ibuprofen (ADVIL,MOTRIN) 600 MG tablet Take twice a day x 2 weeks, then prn pain 03/17/14  Yes Aleksei Plotnikov V, MD  metoprolol (LOPRESSOR) 50 MG tablet Take 1 tablet (50 mg total) by mouth 2 (two) times daily. 06/23/13  Yes Minus Breeding, MD  Multiple Vitamin (MULTIVITAMIN) tablet Take 1 tablet by mouth daily.   Yes Historical Provider, MD  NON FORMULARY C-PAP at night   Yes Historical Provider, MD  pioglitazone-metformin (ACTOPLUS MET) 15-850 MG per tablet Take 1 tablet by mouth 2 (two) times daily with a meal. 03/17/14  Yes Aleksei Plotnikov V, MD  tamsulosin (FLOMAX) 0.4 MG CAPS capsule Take 0.4 mg by mouth daily.   Yes  Historical Provider, MD  loratadine (CLARITIN) 10 MG tablet Take 1 tablet (10 mg total) by mouth daily. Patient not taking: Reported on 09/30/2014 03/17/14   Tyrone Apple Plotnikov V, MD  tadalafil (CIALIS) 20 MG tablet Take 1 tablet (20 mg total) by mouth daily as needed. Patient not taking: Reported on 09/30/2014 07/17/13   Aleksei Plotnikov V, MD  traMADol (ULTRAM) 50 MG tablet Take 50 mg by mouth every 6 (six) hours as needed for pain.    Historical Provider, MD     Physical Exam:  Filed Vitals:   09/30/14 0630 09/30/14 0645 09/30/14 0700 09/30/14 0715  BP: 153/60 134/69 140/72 145/67  Pulse: 67 62 58 57  Temp:      TempSrc:      Resp:      SpO2: 100% 99% 98% 98%    Constitutional: Vital signs reviewed.  Elderly obese male lying in bed in no acute distress  HEENT: Conjunctival congestion, no pallor, no icterus, moist oral mucosa, no cervical lymphadenopathy Cardiovascular: RRR, S1 normal, S2 normal, no MRG Chest: CTAB, no wheezes, rales, or rhonchi Abdominal:  Soft. Non-tender, non-distended, bowel sounds are normal, Rt subcostal surgical scar. Ext: warm, trace edema  Neurological: Alert and oriented  Labs on Admission:  Basic Metabolic Panel:  Recent Labs Lab 09/30/14 0333  NA 139  K 3.6  CL 104  CO2 28  GLUCOSE 137*  BUN 18  CREATININE 1.07  CALCIUM 9.6   Liver Function Tests: No results for input(s): AST, ALT, ALKPHOS, BILITOT, PROT, ALBUMIN in the last 168 hours. No results for input(s): LIPASE, AMYLASE in the last 168 hours. No results for input(s): AMMONIA in the last 168 hours. CBC:  Recent Labs Lab 09/30/14 0333  WBC 6.0  HGB 14.1  HCT 41.7  MCV 88.2  PLT 264   Cardiac Enzymes: No results for input(s): CKTOTAL, CKMB, CKMBINDEX, TROPONINI in the last 168 hours. BNP: Invalid input(s): POCBNP CBG: No results for input(s): GLUCAP in the last 168 hours.  Radiological Exams on Admission: Dg Chest Port 1 View  09/30/2014   CLINICAL DATA:  Left-sided  chest pain.  EXAM: PORTABLE CHEST - 1 VIEW  COMPARISON:  12/11/2012  FINDINGS: Mild cardiac enlargement. Pulmonary vascularity appears normal. No focal airspace disease or consolidation in the lungs. No blunting of costophrenic angles. No pneumothorax. Mediastinal contours appear intact.  IMPRESSION: Mild cardiac enlargement.  No evidence of active pulmonary disease.   Electronically Signed   By: Lucienne Capers M.D.   On: 09/30/2014 04:02    EKG: Normal sinus rhythm at 79, no ST-T changes  Assessment/Plan   Principal Problem:   Chest pain at rest Admit under observation to telemetry. No further chest pain symptoms Patient's heart score is 5. -Continue full dose aspirin. Sublingual nitroglycerin when necessary for chest pain. -Rule out ACS with serial troponins and EKGs -Check 2-D echo to evaluate EF and wall motion abnormality. -Continue metoprolol 50 mg twice daily. -Cardiology consult. Given his risk factors and high heart score will need nuclear stress test.    Active Problems:  Diabetes mellitus Hold metformin. Check A1C, monitor fsg. continue SSI     Essential hypertension Resume home blood pressure medications. (Metoprolol, amlodipine and losartan)     Knee osteoarthritis Takes Advil occasionally. Does not take tramadol anymore    OSA on CPAP Resume nighttime CPAP       PAF (paroxysmal atrial fibrillation) Currently in sinus. Continue aspirin and beta blocker.     Erectile dysfunction Hold Cialis  COPD Asymptomatic. Continue Flonase      Diet: Nothing by mouth  DVT prophylaxis: sq lovenox   Code Status: Full code Family Communication:  None at bedside Disposition Plan: Admit under observation.  Louellen Molder [facility] Pager [phone]  Total time spent on admission  50 minutes  If 7PM-7AM, please contact night-coverage [email or web address] Password [facility] 09/30/2014, 7:37 AM

## 2014-09-30 NOTE — ED Notes (Signed)
Attempted report 

## 2014-09-30 NOTE — ED Notes (Signed)
MD at bedside. 

## 2014-09-30 NOTE — Progress Notes (Addendum)
I clarified with Dr. Stanford Breed that the plan is for nuc for tomorrow once patient rules out.  The patient had already been brought down for rest images in nuc med. Per d/w Dr. Stanford Breed will complete stress portion in AM. Nuc med aware. NPO after midnight. They said we don't have to write a new order - they will just complete other half of test tomorrow. Shaday Rayborn PA-C

## 2014-09-30 NOTE — Consult Note (Signed)
Primary cardiologist: [facility]  HPI: 64 year old male with past medical history of diabetes mellitus, hypertension, hyperlipidemia, paroxysmal atrial fibrillation, COPD, prostate cancer for evaluation of chest pain. Echocardiogram April 2014 showed normal LV function, grade 2 diastolic dysfunction and mild left atrial enlargement. Nuclear study September 2014 showed an ejection fraction of 73% and normal perfusion. Patient typically has some dyspnea on exertion but no orthopnea or PND. He does have occasional pedal edema controlled with Lasix. He does not have exertional chest pain. After called to bed last evening he awoke at approximately 2 AM with pain in the left chest area radiating to his neck. It was described as a dull sensation. There is associated dyspnea and nausea but no diaphoresis. The pain was not pleuritic or positional. No associated water brash. The pain persisted and he presented to the emergency room. His pain improved with nitroglycerin with total duration of approximately 3 hours. Presently pain-free.  Medications Prior to Admission  Medication Sig Dispense Refill  . amLODipine-olmesartan (AZOR) 10-40 MG per tablet Take 1 tablet by mouth daily. 90 tablet 3  . aspirin (BAYER ASPIRIN) 325 MG tablet Take 1 tablet (325 mg total) by mouth daily. 100 tablet 3  . diclofenac sodium (VOLTAREN) 1 % GEL Apply 1 application topically 4 (four) times daily. 100 g 5  . fluticasone (FLONASE) 50 MCG/ACT nasal spray Place 2 sprays into both nostrils daily. 16 g 6  . furosemide (LASIX) 20 MG tablet Take 1-2 tablets (20-40 mg total) by mouth daily as needed for edema. 60 tablet 5  . ibuprofen (ADVIL,MOTRIN) 600 MG tablet Take twice a day x 2 weeks, then prn pain 60 tablet 2  . metoprolol (LOPRESSOR) 50 MG tablet Take 1 tablet (50 mg total) by mouth 2 (two) times daily. 180 tablet 3  . Multiple Vitamin (MULTIVITAMIN) tablet Take 1 tablet by mouth daily.    . NON FORMULARY C-PAP at night    .  pioglitazone-metformin (ACTOPLUS MET) 15-850 MG per tablet Take 1 tablet by mouth 2 (two) times daily with a meal. 180 tablet 3  . tamsulosin (FLOMAX) 0.4 MG CAPS capsule Take 0.4 mg by mouth daily.    Marland Kitchen loratadine (CLARITIN) 10 MG tablet Take 1 tablet (10 mg total) by mouth daily. (Patient not taking: Reported on 09/30/2014) 100 tablet 3  . tadalafil (CIALIS) 20 MG tablet Take 1 tablet (20 mg total) by mouth daily as needed. (Patient not taking: Reported on 09/30/2014) 12 tablet 3  . traMADol (ULTRAM) 50 MG tablet Take 50 mg by mouth every 6 (six) hours as needed for pain.      Allergies  Allergen Reactions  . Benazepril Hcl Other (See Comments) and Cough  . Sildenafil     Unknown     Past Medical History  Diagnosis Date  . Hypertension   . LBP (low back pain)   . Osteoarthritis   . ED (erectile dysfunction)   . Diabetes mellitus type II   . Hyperlipidemia   . COPD, moderate     on PFT 2010 due to previous 20y h/o smoking  . Sleep apnea   . Colon polyp   . Cancer 3/14    7/14 s/p XRT seeds in [place] ([facility] [place])  . Atrial fibrillation     Past Surgical History  Procedure Laterality Date  . Back surgery    . Total knee arthroplasty Left 06-2012  . Vasectomy    . Liver surgery      History  Social History  . Marital Status: Married    Spouse Name: N/A    Number of Children: 2  . Years of Education: N/A   Occupational History  . [employer]     Retired  . teaching    Social History Main Topics  . Smoking status: Former Smoker    Types: Cigarettes    Quit date: 09/04/1999  . Smokeless tobacco: Never Used  . Alcohol Use: 0.0 oz/week    0 Not specified per week     Comment: Occasional  . Drug Use: No  . Sexual Activity: Yes   Other Topics Concern  . Not on file   Social History Narrative   Regular exercise- yes, walking    Family History  Problem Relation Age of Onset  . Diabetes Mother   . Heart disease Mother     Pacemaker  . Pancreatic  cancer Father   . Hypertension Other   . Stomach cancer Father   . Stomach cancer Sister   . Colon cancer Neg Hx     ROS:  no fevers or chills, productive cough, hemoptysis, dysphasia, odynophagia, melena, hematochezia, dysuria, hematuria, rash, seizure activity, orthopnea, PND, pedal edema, claudication. Remaining systems are negative.  Physical Exam:   Blood pressure 169/85, pulse 58, temperature 97.8 F (36.6 C), temperature source Oral, resp. rate 16, height '6\' 2"'  (1.88 m), weight 259 lb 14.4 oz (117.89 kg), SpO2 100 %.  General:  Well developed/well nourished in NAD Skin warm/dry Patient not depressed No peripheral clubbing Back-normal HEENT-normal/normal eyelids Neck supple/normal carotid upstroke bilaterally; no bruits; no JVD; no thyromegaly chest - CTA/ normal expansion CV - RRR/normal S1 and S2; no murmurs, rubs or gallops;  PMI nondisplaced Abdomen -NT/mildly distended, status post previous liver resection,  + bowel sounds, no bruit 2+ femoral pulses, no bruits Ext-trace edema, no chords, diminished distal pulses Neuro-grossly nonfocal  ECG normal sinus rhythm with minor nonspecific ST changes.  Results for orders placed or performed during the hospital encounter of 09/30/14 (from the past 48 hour(s))  CBC     Status: None   Collection Time: 09/30/14  3:33 AM  Result Value Ref Range   WBC 6.0 4.0 - 10.5 K/uL   RBC 4.73 4.22 - 5.81 MIL/uL   Hemoglobin 14.1 13.0 - 17.0 g/dL   HCT 41.7 39.0 - 52.0 %   MCV 88.2 78.0 - 100.0 fL   MCH 29.8 26.0 - 34.0 pg   MCHC 33.8 30.0 - 36.0 g/dL   RDW 13.5 11.5 - 15.5 %   Platelets 264 150 - 400 K/uL  Basic metabolic panel     Status: Abnormal   Collection Time: 09/30/14  3:33 AM  Result Value Ref Range   Sodium 139 135 - 145 mmol/L   Potassium 3.6 3.5 - 5.1 mmol/L   Chloride 104 96 - 112 mmol/L   CO2 28 19 - 32 mmol/L   Glucose, Bld 137 (H) 70 - 99 mg/dL   BUN 18 6 - 23 mg/dL   Creatinine, Ser 1.07 0.50 - 1.35 mg/dL    Calcium 9.6 8.4 - 10.5 mg/dL   GFR calc non Af Amer 71 (L) >90 mL/min   GFR calc Af Amer 82 (L) >90 mL/min    Comment: (NOTE) The eGFR has been calculated using the CKD EPI equation. This calculation has not been validated in all clinical situations. eGFR's persistently <90 mL/min signify possible Chronic Kidney Disease.    Anion gap 7 5 - 15  I-stat  troponin, ED (not at [facility])     Status: None   Collection Time: 09/30/14  3:37 AM  Result Value Ref Range   Troponin i, poc 0.00 0.00 - 0.08 ng/mL   Comment 3            Comment: Due to the release kinetics of cTnI, a negative result within the first hours of the onset of symptoms does not rule out myocardial infarction with certainty. If myocardial infarction is still suspected, repeat the test at appropriate intervals.   Urinalysis, Routine w reflex microscopic     Status: None   Collection Time: 09/30/14  3:47 AM  Result Value Ref Range   Color, Urine YELLOW YELLOW   APPearance CLEAR CLEAR   Specific Gravity, Urine 1.012 1.005 - 1.030   pH 7.0 5.0 - 8.0   Glucose, UA NEGATIVE NEGATIVE mg/dL   Hgb urine dipstick NEGATIVE NEGATIVE   Bilirubin Urine NEGATIVE NEGATIVE   Ketones, ur NEGATIVE NEGATIVE mg/dL   Protein, ur NEGATIVE NEGATIVE mg/dL   Urobilinogen, UA 0.2 0.0 - 1.0 mg/dL   Nitrite NEGATIVE NEGATIVE   Leukocytes, UA NEGATIVE NEGATIVE    Comment: MICROSCOPIC NOT DONE ON URINES WITH NEGATIVE PROTEIN, BLOOD, LEUKOCYTES, NITRITE, OR GLUCOSE <1000 mg/dL.    Dg Chest Port 1 View  09/30/2014   CLINICAL DATA:  Left-sided chest pain.  EXAM: PORTABLE CHEST - 1 VIEW  COMPARISON:  12/11/2012  FINDINGS: Mild cardiac enlargement. Pulmonary vascularity appears normal. No focal airspace disease or consolidation in the lungs. No blunting of costophrenic angles. No pneumothorax. Mediastinal contours appear intact.  IMPRESSION: Mild cardiac enlargement.  No evidence of active pulmonary disease.   Electronically Signed   By: Lucienne Capers  M.D.   On: 09/30/2014 04:02    Assessment/Plan 1 chest pain-symptoms with both typical and atypical features. Initial electrocardiogram without significant ST changes. Initial enzymes negative. Would continue to cycle enzymes. If negative plan nuclear study tomorrow morning for risk stratification. If abnormal he would require cardiac catheterization. Continue aspirin and metoprolol. 2 history of paroxysmal atrial fibrillation-the patient did have heart racing with his chest pain last evening. Question recurrent atrial fibrillation. Presently in sinus rhythm. Continue metoprolol. Embolic risk factors include age 62, diabetes mellitus and hypertension. CHADS vasc 3. At discharge would discontinue aspirin and treat with apixaban 5 mg po BID. 3 hypertension-continue preadmission medications and adjust as needed. For diabetes mellitus-management per primary care.  Kirk Ruths MD 09/30/2014, 9:10 AM

## 2014-09-30 NOTE — ED Notes (Signed)
Presents with SOB and chest tightness associated with nausea began this evening at midnight and has worsened. Pain woke patient from sleep and is constant. Visibly uncomfortable denies radiation.

## 2014-10-01 DIAGNOSIS — M1990 Unspecified osteoarthritis, unspecified site: Secondary | ICD-10-CM | POA: Diagnosis not present

## 2014-10-01 DIAGNOSIS — I10 Essential (primary) hypertension: Secondary | ICD-10-CM

## 2014-10-01 DIAGNOSIS — I480 Paroxysmal atrial fibrillation: Secondary | ICD-10-CM

## 2014-10-01 DIAGNOSIS — R079 Chest pain, unspecified: Secondary | ICD-10-CM | POA: Diagnosis not present

## 2014-10-01 DIAGNOSIS — E118 Type 2 diabetes mellitus with unspecified complications: Secondary | ICD-10-CM

## 2014-10-01 DIAGNOSIS — M545 Low back pain: Secondary | ICD-10-CM | POA: Diagnosis not present

## 2014-10-01 DIAGNOSIS — G4733 Obstructive sleep apnea (adult) (pediatric): Secondary | ICD-10-CM

## 2014-10-01 LAB — TROPONIN I: Troponin I: <0.03 ng/mL (ref <0.031)

## 2014-10-01 LAB — GLUCOSE, CAPILLARY
GLUCOSE-CAPILLARY: 102 mg/dL — AB (ref 70–99)
Glucose-Capillary: 156 mg/dL — ABNORMAL HIGH (ref 70–99)

## 2014-10-01 MED ORDER — INSULIN ASPART 100 UNIT/ML ~~LOC~~ SOLN
0.0000 [IU] | Freq: Three times a day (TID) | SUBCUTANEOUS | Status: DC
  Administered 2014-10-01: 3 [IU] via SUBCUTANEOUS

## 2014-10-01 MED ORDER — TECHNETIUM TC 99M SESTAMIBI GENERIC - CARDIOLITE
30.0000 | Freq: Once | INTRAVENOUS | Status: AC | PRN
  Administered 2014-10-01: 30 via INTRAVENOUS

## 2014-10-01 MED ORDER — APIXABAN 5 MG PO TABS: 5.0000 mg | ORAL_TABLET | Freq: Two times a day (BID) | ORAL | Status: DC

## 2014-10-01 MED ORDER — REGADENOSON 0.4 MG/5ML IV SOLN
0.4000 mg | Freq: Once | INTRAVENOUS | Status: AC
  Administered 2014-10-01: 0.4 mg via INTRAVENOUS
  Filled 2014-10-01: qty 5

## 2014-10-01 MED ORDER — REGADENOSON 0.4 MG/5ML IV SOLN
INTRAVENOUS | Status: AC
  Filled 2014-10-01: qty 5

## 2014-10-01 MED ORDER — TECHNETIUM TC 99M SESTAMIBI GENERIC - CARDIOLITE
10.0000 | Freq: Once | INTRAVENOUS | Status: AC | PRN
  Administered 2014-09-30: 10 via INTRAVENOUS

## 2014-10-01 NOTE — Discharge Summary (Signed)
Physician Discharge Summary  [name] [name] YWV:[id number] DOB: [birth year] DOA: 09/30/2014  PCP: Walker Kehr, MD  Admit date: 09/30/2014 Discharge date: 10/03/2014  Time spent: 35 minutes  Recommendations for Outpatient Follow-up:  1. Cardiology follow 2. Started on eliquis  Discharge Diagnoses:  Principal Problem:   Chest pain at rest Active Problems:   DM (diabetes mellitus), type 2 with complications   ERECTILE DYSFUNCTION   Essential hypertension   TOBACCO USE, QUIT   Knee osteoarthritis   OSA on CPAP   Liver mass   PAF (paroxysmal atrial fibrillation)   Discharge Condition: improved  Diet recommendation: cardiac  Filed Weights   09/30/14 0810 10/01/14 0538  Weight: 117.89 kg (259 lb 14.4 oz) 117.845 kg (259 lb 12.8 oz)    History of present illness:  64 year old obese male with history of hypertension, type 2 diabetes mellitus, hyperlipidemia (not on any medications), paroxysmal A. fib, COPD (quit smoking over 10 years ago with 20-pack-year smoking history), OSA on CPAP at night, history of prostate cancer with liver mass status post XRT seeds in [place] in 2014, liver mass status post resection, osteoarthritis of the knee, chronic low back pain and erectile dysfunction who presented to the ED early this morning with acute onset of chest pain at rest. Patient reports going out to dinner at [facility] and return home last evening. When he went to bed around 11 PM he started having left-sided chest pain which was squeezing in nature and radiating to his left neck and felt like he was unable to swallow. The pain was 8/10 in intensity without any aggravating or relieving factors. This was associated with shortness of breath and some palpitations. Patient took his full dose aspirin without any relief. Pain lasted for several minutes until he came to the ED. Patient was given another full dose aspirin without any relief and then was given sublingual nitroglycerin which  reduced the pain to 5/10 and then a second dose after which the pain subsided.  Patient reports having similar pain when he had his liver surgery. Patient had a 2-D echo in 2014 which showed EF of 55-60% with grade 2 diastolic dysfunction and septal hypertrophy. He also had a stress test in September 2014 which was a normal study.   Patient reports mild headache when the chest pain started associated with shortness of breath and palpitations. Patient denies dizziness, fever, chills, nausea , vomiting, abdominal pain, bowel or urinary symptoms. He reports occasional leg swellings for his which he takes Lasix as needed. Denies change in weight or appetite. Patient works as an Quarry manager at [employer] and reports being fairly active with his day to day activities. Denies recent illness or travel. Denies any change in his medications recently.  Hospital Course:  Chest pain- stress test negative  A fib- eliquis started  Procedures:    Consultations:    Discharge Exam: Filed Vitals:   10/01/14 1445  BP: 134/63  Pulse: 74  Temp: 98.2 F (36.8 C)  Resp:     General: A+Ox3, NAD- no chest pain Cardiovascular: rrr Respiratory: clear  Discharge Instructions   Discharge Instructions    Diet - low sodium heart healthy    Complete by:  As directed      Diet Carb Modified    Complete by:  As directed      Increase activity slowly    Complete by:  As directed           Discharge Medication List as of 10/01/2014  3:45 PM    START taking these medications   Details  apixaban (ELIQUIS) 5 MG TABS tablet Take 1 tablet (5 mg total) by mouth 2 (two) times daily., Starting 10/01/2014, Until Discontinued, Print      CONTINUE these medications which have NOT CHANGED   Details  amLODipine-olmesartan (AZOR) 10-40 MG per tablet Take 1 tablet by mouth daily., Starting 03/22/2014, Until Discontinued, Normal    aspirin (BAYER ASPIRIN) 325 MG tablet Take 1 tablet (325 mg total) by mouth daily.,  Starting 05/25/2013, Until Discontinued, Print    diclofenac sodium (VOLTAREN) 1 % GEL Apply 1 application topically 4 (four) times daily., Starting 03/15/2011, Until Discontinued, Normal    fluticasone (FLONASE) 50 MCG/ACT nasal spray Place 2 sprays into both nostrils daily., Starting 03/17/2014, Until Discontinued, Print    furosemide (LASIX) 20 MG tablet Take 1-2 tablets (20-40 mg total) by mouth daily as needed for edema., Starting 07/13/2014, Until Discontinued, Normal    metoprolol (LOPRESSOR) 50 MG tablet Take 1 tablet (50 mg total) by mouth 2 (two) times daily., Starting 06/23/2013, Until Discontinued, Normal    Multiple Vitamin (MULTIVITAMIN) tablet Take 1 tablet by mouth daily., Until Discontinued, Historical Med    NON FORMULARY C-PAP at night, Until Discontinued, Historical Med    pioglitazone-metformin (ACTOPLUS MET) 15-850 MG per tablet Take 1 tablet by mouth 2 (two) times daily with a meal., Starting 03/17/2014, Until Discontinued, Normal    tamsulosin (FLOMAX) 0.4 MG CAPS capsule Take 0.4 mg by mouth daily., Until Discontinued, Historical Med    traMADol (ULTRAM) 50 MG tablet Take 50 mg by mouth every 6 (six) hours as needed for pain., Until Discontinued, Historical Med      STOP taking these medications     ibuprofen (ADVIL,MOTRIN) 600 MG tablet      loratadine (CLARITIN) 10 MG tablet      tadalafil (CIALIS) 20 MG tablet        Allergies  Allergen Reactions  . Benazepril Hcl Other (See Comments) and Cough  . Sildenafil     Unknown   Follow-up Information    Follow up with Minus Breeding, MD.   Specialty:  Cardiology   Why:  Office will contact you to schedule followup with Dr. Percival Spanish or his PA in the clinic in 2-4 weeks   Contact information:   [address] [place] [address] [phone]        The results of significant diagnostics from this hospitalization (including imaging, microbiology, ancillary and laboratory) are listed below  for reference.    Significant Diagnostic Studies: Nm Myocar Multi W/spect W/wall Motion / Ef  10/01/2014   CLINICAL DATA:  Patient is a 64 yo with history of CP Test to evaluate, rule out ischemia  EXAM: MYOCARDIAL IMAGING WITH SPECT (PHARMACOLOGIC-STRESS)  GATED LEFT VENTRICULAR WALL MOTION STUDY  LEFT VENTRICULAR EJECTION FRACTION  TECHNIQUE: Intravenous infusion of Lexiscan was performed under the supervision of the Cardiology staff. At peak effect of the drug, 10 mCi [id number] was injected intravenously and standard myocardial SPECT imaging was performed. Quantitative gated imaging was also performed to evaluate left ventricular wall motion, and estimate left ventricular ejection fraction.  COMPARISON:  None.  FINDINGS: Baseline EKG SR 66 bpm With infusion of Lexiscan there were no ST changes to suggest ischemia.  Nuclear data: In the initial stress images there was minimal thinning in inferior wall (base), inferolateral wall (base, mid) Otherwise normal perfusion.  In the recovery images there was no significant change  With gating, LVEF was calculated at 70% with normal wall motion  ON review of the raw data, the inferior/inferolateral thinning most likely reflects soft tissue attenuation (diaphragm, bowel)  IMPRESSION: Lexiscan myoview: Electrically negative for ischemia. Myoview scan with small region of inferior/inferolateral thinning that most likely reflects soft tissue attenuation (diaphragm) and normal perfusion. LVEF 70%.  Overall low risk scan.   Electronically Signed   By: Dorris Carnes M.D.   On: 10/01/2014 16:04   Dg Chest Port 1 View  09/30/2014   CLINICAL DATA:  Left-sided chest pain.  EXAM: PORTABLE CHEST - 1 VIEW  COMPARISON:  12/11/2012  FINDINGS: Mild cardiac enlargement. Pulmonary vascularity appears normal. No focal airspace disease or consolidation in the lungs. No blunting of costophrenic angles. No pneumothorax. Mediastinal contours appear intact.  IMPRESSION: Mild cardiac  enlargement.  No evidence of active pulmonary disease.   Electronically Signed   By: Lucienne Capers M.D.   On: 09/30/2014 04:02    Microbiology: No results found for this or any previous visit (from the past 240 hour(s)).   Labs: Basic Metabolic Panel:  Recent Labs Lab 09/30/14 0333  NA 139  K 3.6  CL 104  CO2 28  GLUCOSE 137*  BUN 18  CREATININE 1.07  CALCIUM 9.6   Liver Function Tests: No results for input(s): AST, ALT, ALKPHOS, BILITOT, PROT, ALBUMIN in the last 168 hours. No results for input(s): LIPASE, AMYLASE in the last 168 hours. No results for input(s): AMMONIA in the last 168 hours. CBC:  Recent Labs Lab 09/30/14 0333  WBC 6.0  HGB 14.1  HCT 41.7  MCV 88.2  PLT 264   Cardiac Enzymes:  Recent Labs Lab 09/30/14 1330 09/30/14 1946 10/01/14 0105  TROPONINI <0.03 <0.03 <0.03   BNP: BNP (last 3 results) No results for input(s): PROBNP in the last 8760 hours. CBG:  Recent Labs Lab 09/30/14 1155 09/30/14 1640 09/30/14 2051 10/01/14 0815 10/01/14 1256  GLUCAP 78 88 124* 102* 156*       Signed:  Yazmine Sorey  [facility] 10/03/2014, 3:50 PM

## 2014-10-01 NOTE — Discharge Instructions (Signed)
Apixaban oral tablets What is this medicine? APIXABAN (a PIX a ban) is an anticoagulant (blood thinner). It is used to lower the chance of stroke in people with a medical condition called atrial fibrillation. It is also used to treat or prevent blood clots in the lungs or in the veins. This medicine may be used for other purposes; ask your health care provider or pharmacist if you have questions. COMMON BRAND NAME(S): Eliquis What should I tell my health care provider before I take this medicine? They need to know if you have any of these conditions: -bleeding disorders -bleeding in the brain -blood in your stools (black or tarry stools) or if you have blood in your vomit -history of stomach bleeding -kidney disease -liver disease -mechanical heart valve -an unusual or allergic reaction to apixaban, other medicines, foods, dyes, or preservatives -pregnant or trying to get pregnant -breast-feeding How should I use this medicine? Take this medicine by mouth with a glass of water. Follow the directions on the prescription label. You can take it with or without food. If it upsets your stomach, take it with food. Take your medicine at regular intervals. Do not take it more often than directed. Do not stop taking except on your doctor's advice. Stopping this medicine may increase your risk of a blot clot. Be sure to refill your prescription before you run out of medicine. Talk to your pediatrician regarding the use of this medicine in children. Special care may be needed. Overdosage: If you think you have taken too much of this medicine contact a poison control center or emergency room at once. NOTE: This medicine is only for you. Do not share this medicine with others. What if I miss a dose? If you miss a dose, take it as soon as you can. If it is almost time for your next dose, take only that dose. Do not take double or extra doses. What may interact with this medicine? This medicine may  interact with the following: -aspirin and aspirin-like medicines -certain medicines for fungal infections like ketoconazole and itraconazole -certain medicines for seizures like carbamazepine and phenytoin -certain medicines that treat or prevent blood clots like warfarin, enoxaparin, and dalteparin -clarithromycin -NSAIDs, medicines for pain and inflammation, like ibuprofen or naproxen -rifampin -ritonavir -St. John's wort This list may not describe all possible interactions. Give your health care provider a list of all the medicines, herbs, non-prescription drugs, or dietary supplements you use. Also tell them if you smoke, drink alcohol, or use illegal drugs. Some items may interact with your medicine. What should I watch for while using this medicine? Notify your doctor or health care professional and seek emergency treatment if you develop breathing problems; changes in vision; chest pain; severe, sudden headache; pain, swelling, warmth in the leg; trouble speaking; sudden numbness or weakness of the face, arm, or leg. These can be signs that your condition has gotten worse. If you are going to have surgery, tell your doctor or health care professional that you are taking this medicine. Tell your health care professional that you use this medicine before you have a spinal or epidural procedure. Sometimes people who take this medicine have bleeding problems around the spine when they have a spinal or epidural procedure. This bleeding is very rare. If you have a spinal or epidural procedure while on this medicine, call your health care professional immediately if you have back pain, numbness or tingling (especially in your legs and feet), muscle weakness, paralysis, or loss   of bladder or bowel control. Avoid sports and activities that might cause injury while you are using this medicine. Severe falls or injuries can cause unseen bleeding. Be careful when using sharp tools or knives. Consider using  an electric razor. Take special care brushing or flossing your teeth. Report any injuries, bruising, or red spots on the skin to your doctor or health care professional. What side effects may I notice from receiving this medicine? Side effects that you should report to your doctor or health care professional as soon as possible: -allergic reactions like skin rash, itching or hives, swelling of the face, lips, or tongue -signs and symptoms of bleeding such as bloody or black, tarry stools; red or dark-brown urine; spitting up blood or brown material that looks like coffee grounds; red spots on the skin; unusual bruising or bleeding from the eye, gums, or nose This list may not describe all possible side effects. Call your doctor for medical advice about side effects. You may report side effects to FDA at 1-800-FDA-1088. Where should I keep my medicine? Keep out of the reach of children. Store at room temperature between 20 and 25 degrees C (68 and 77 degrees F). Throw away any unused medicine after the expiration date. NOTE: This sheet is a summary. It may not cover all possible information. If you have questions about this medicine, talk to your doctor, pharmacist, or health care provider.  2015, Elsevier/Gold Standard. (2013-04-24 11:59:24)  

## 2014-10-01 NOTE — ED Provider Notes (Signed)
CSN: [id number]     Arrival date & time 09/30/14  0315 History   First MD Initiated Contact with Patient 09/30/14 0411     Chief Complaint  Patient presents with  . Chest Pain     (Consider location/radiation/quality/duration/timing/severity/associated sxs/prior Treatment) HPI Comments: 64 year old obese male with history of hypertension, type 2 diabetes mellitus, hyperlipidemia (not on any medications), paroxysmal A. fib, COPD (quit smoking over 10 years ago with 20-pack-year smoking history), OSA on CPAP comes in with cc of chest pain. Pt has no known CAD, has haf a stress in 2014 for surgical clearance and it was neg. Pt started having L sided chest pain radiating to the neck last night. The pain is sharp in nature. + dib, no other associated sx.   Patient is a 64 y.o. male presenting with chest pain. The history is provided by the patient.  Chest Pain Associated symptoms: no abdominal pain, no cough and no shortness of breath     Past Medical History  Diagnosis Date  . Hypertension   . LBP (low back pain)   . Osteoarthritis   . ED (erectile dysfunction)   . Diabetes mellitus type II   . Hyperlipidemia   . COPD, moderate     on PFT 2010 due to previous 20y h/o smoking  . Sleep apnea   . Colon polyp   . Cancer 3/14    7/14 s/p XRT seeds in [place] ([facility] [place])  . Atrial fibrillation    Past Surgical History  Procedure Laterality Date  . Back surgery    . Total knee arthroplasty Left 06-2012  . Vasectomy    . Liver surgery    . Prostate seeds     Family History  Problem Relation Age of Onset  . Diabetes Mother   . Heart disease Mother     Pacemaker  . Pancreatic cancer Father   . Hypertension Other   . Stomach cancer Father   . Stomach cancer Sister   . Colon cancer Neg Hx    History  Substance Use Topics  . Smoking status: Former Smoker    Types: Cigarettes    Quit date: 09/04/1999  . Smokeless tobacco: Never Used  . Alcohol Use: 0.0  oz/week    0 Not specified per week     Comment: Occasional    Review of Systems  Constitutional: Negative for activity change and appetite change.  Respiratory: Negative for cough and shortness of breath.   Cardiovascular: Positive for chest pain.  Gastrointestinal: Negative for abdominal pain.  Genitourinary: Negative for dysuria.  All other systems reviewed and are negative.     Allergies  Benazepril hcl and Sildenafil  Home Medications   Prior to Admission medications   Medication Sig Start Date End Date Taking? Authorizing Provider  amLODipine-olmesartan (AZOR) 10-40 MG per tablet Take 1 tablet by mouth daily. 03/22/14  Yes Aleksei Plotnikov V, MD  aspirin (BAYER ASPIRIN) 325 MG tablet Take 1 tablet (325 mg total) by mouth daily. 05/25/13  Yes Aleksei Plotnikov V, MD  diclofenac sodium (VOLTAREN) 1 % GEL Apply 1 application topically 4 (four) times daily. 03/15/11  Yes Aleksei Plotnikov V, MD  fluticasone (FLONASE) 50 MCG/ACT nasal spray Place 2 sprays into both nostrils daily. 03/17/14  Yes Aleksei Plotnikov V, MD  furosemide (LASIX) 20 MG tablet Take 1-2 tablets (20-40 mg total) by mouth daily as needed for edema. 07/13/14  Yes Aleksei Plotnikov V, MD  ibuprofen (ADVIL,MOTRIN) 600 MG tablet Take  twice a day x 2 weeks, then prn pain 03/17/14  Yes Aleksei Plotnikov V, MD  metoprolol (LOPRESSOR) 50 MG tablet Take 1 tablet (50 mg total) by mouth 2 (two) times daily. 06/23/13  Yes Minus Breeding, MD  Multiple Vitamin (MULTIVITAMIN) tablet Take 1 tablet by mouth daily.   Yes Historical Provider, MD  NON FORMULARY C-PAP at night   Yes Historical Provider, MD  pioglitazone-metformin (ACTOPLUS MET) 15-850 MG per tablet Take 1 tablet by mouth 2 (two) times daily with a meal. 03/17/14  Yes Aleksei Plotnikov V, MD  tamsulosin (FLOMAX) 0.4 MG CAPS capsule Take 0.4 mg by mouth daily.   Yes Historical Provider, MD  loratadine (CLARITIN) 10 MG tablet Take 1 tablet (10 mg total) by mouth  daily. Patient not taking: Reported on 09/30/2014 03/17/14   Tyrone Apple Plotnikov V, MD  tadalafil (CIALIS) 20 MG tablet Take 1 tablet (20 mg total) by mouth daily as needed. Patient not taking: Reported on 09/30/2014 07/17/13   Aleksei Plotnikov V, MD  traMADol (ULTRAM) 50 MG tablet Take 50 mg by mouth every 6 (six) hours as needed for pain.    Historical Provider, MD   BP 151/77 mmHg  Pulse 65  Temp(Src) 98 F (36.7 C) (Oral)  Resp 16  Ht 6' 2" (1.88 m)  Wt 259 lb 12.8 oz (117.845 kg)  BMI 33.34 kg/m2  SpO2 94% Physical Exam  Constitutional: He is oriented to person, place, and time. He appears well-developed.  HENT:  Head: Normocephalic and atraumatic.  Eyes: Conjunctivae and EOM are normal. Pupils are equal, round, and reactive to light.  Neck: Normal range of motion. Neck supple.  Cardiovascular: Normal rate and regular rhythm.   Pulmonary/Chest: Effort normal and breath sounds normal.  Abdominal: Soft. Bowel sounds are normal. He exhibits no distension. There is no tenderness. There is no rebound and no guarding.  Neurological: He is alert and oriented to person, place, and time.  Skin: Skin is warm.  Nursing note and vitals reviewed.   ED Course  Procedures (including critical care time) Labs Review Labs Reviewed  BASIC METABOLIC PANEL - Abnormal; Notable for the following:    Glucose, Bld 137 (*)    GFR calc non Af Amer 71 (*)    GFR calc Af Amer 82 (*)    All other components within normal limits  GLUCOSE, CAPILLARY - Abnormal; Notable for the following:    Glucose-Capillary 124 (*)    All other components within normal limits  CBC  URINALYSIS, ROUTINE W REFLEX MICROSCOPIC  TROPONIN I  TROPONIN I  TROPONIN I  GLUCOSE, CAPILLARY  GLUCOSE, CAPILLARY  I-STAT TROPOININ, ED    Imaging Review Dg Chest Port 1 View  09/30/2014   CLINICAL DATA:  Left-sided chest pain.  EXAM: PORTABLE CHEST - 1 VIEW  COMPARISON:  12/11/2012  FINDINGS: Mild cardiac enlargement. Pulmonary  vascularity appears normal. No focal airspace disease or consolidation in the lungs. No blunting of costophrenic angles. No pneumothorax. Mediastinal contours appear intact.  IMPRESSION: Mild cardiac enlargement.  No evidence of active pulmonary disease.   Electronically Signed   By: Lucienne Capers M.D.   On: 09/30/2014 04:02     EKG Interpretation   Date/Time:  Thursday September 30 2014 03:16:24 EST Ventricular Rate:  79 PR Interval:  138 QRS Duration: 86 QT Interval:  380 QTC Calculation: 435 R Axis:   48 Text Interpretation:  Normal sinus rhythm Normal ECG No significant change  since last tracing Confirmed by ,  MD, Thelma Comp [phone]) on 09/30/2014  4:32:03 AM      MDM   Final diagnoses:  Acute chest pain    Pt comes in with cc of chest pain. Chest pain resolved on it's own. He has HEART score of 5, and so we shall admit him for further cardiac workup. PE, dissection considered unlikely.  Varney Biles, MD 10/01/14 [phone]

## 2014-10-01 NOTE — Progress Notes (Signed)
lexiscan myoview completed without complications, Nuc results to follow.    Lorretta Harp, M.D., [place], [facility], [place] [address] [place], [place]  [address]  [phone] 10/01/2014 2:07 PM

## 2014-10-01 NOTE — Progress Notes (Signed)
ANTICOAGULATION CONSULT NOTE - Initial Consult  Pharmacy Consult for apixaban Indication: atrial fibrillation  Allergies  Allergen Reactions  . Benazepril Hcl Other (See Comments) and Cough  . Sildenafil     Unknown    Patient Measurements: Height: 6\' 2"  (188 cm) Weight: 259 lb 12.8 oz (117.845 kg) IBW/kg (Calculated) : 82.2 Heparin Dosing Weight:   Vital Signs: Temp: 98.2 F (36.8 C) (01/29 1445) Temp Source: Oral (01/29 1445) BP: 134/63 mmHg (01/29 1445) Pulse Rate: 74 (01/29 1445)  Labs:  Recent Labs  09/30/14 0333 09/30/14 1330 09/30/14 1946 10/01/14 0105  HGB 14.1  --   --   --   HCT 41.7  --   --   --   PLT 264  --   --   --   CREATININE 1.07  --   --   --   TROPONINI  --  <0.03 <0.03 <0.03    Estimated Creatinine Clearance: 93.8 mL/min (by C-G formula based on Cr of 1.07).   Medical History: Past Medical History  Diagnosis Date  . Hypertension   . LBP (low back pain)   . Osteoarthritis   . ED (erectile dysfunction)   . Diabetes mellitus type II   . Hyperlipidemia   . COPD, moderate     on PFT 2010 due to previous 20y h/o smoking  . Sleep apnea   . Colon polyp   . Cancer 3/14    7/14 s/p XRT seeds in [place] ([facility] [place])  . Atrial fibrillation     Medications:  Scheduled:  . amLODipine  10 mg Oral Daily   And  . irbesartan  300 mg Oral Daily  . apixaban  5 mg Oral BID  . aspirin EC  325 mg Oral Daily  . fluticasone  2 spray Each Nare Daily  . insulin aspart  0-15 Units Subcutaneous TID WC  . metoprolol  50 mg Oral BID  . multivitamin with minerals  1 tablet Oral Daily  . regadenoson      . tamsulosin  0.4 mg Oral Daily   Infusions:    Assessment: 64 yo who was admitted for CP yesterday. He has a hx of afib. He wasn't on anticoagulation at presentation. Plan is to start apixaban now. Baseline CBC wnl.   Goal of Therapy:  Monitor platelets by anticoagulation protocol: Yes   Plan:   Apixaban 5mg  PO BID Monitor  for bleeding  Onnie Boer, PharmD Pager: [phone] 10/01/2014 3:31 PM

## 2014-10-01 NOTE — Progress Notes (Signed)
Per Dr. Kennon Holter verbal report, [name] [name] appears to be normal. Will start on 5mg  BID eliquis. I have sent staff message to our schedule who will arrange followup. Likely discharge today  Hilbert Corrigan [place] Pager: [phone]

## 2014-10-01 NOTE — Progress Notes (Addendum)
Patient Name: [name] [name] Date of Encounter: 10/01/2014     Principal Problem:   Chest pain at rest Active Problems:   DM (diabetes mellitus), type 2 with complications   ERECTILE DYSFUNCTION   Essential hypertension   TOBACCO USE, QUIT   Knee osteoarthritis   OSA on CPAP   Liver mass   PAF (paroxysmal atrial fibrillation)    SUBJECTIVE  Denies any recurrent chest pain around 2:30am yesterday morning. No SOB. Feeling very well overnight  CURRENT MEDS . amLODipine  10 mg Oral Daily   And  . irbesartan  300 mg Oral Daily  . aspirin EC  325 mg Oral Daily  . enoxaparin (LOVENOX) injection  40 mg Subcutaneous Q24H  . fentaNYL  50 mcg Intravenous Once  . fluticasone  2 spray Each Nare Daily  . insulin aspart  0-15 Units Subcutaneous TID WC  . metoprolol  50 mg Oral BID  . multivitamin with minerals  1 tablet Oral Daily  . regadenoson  0.4 mg Intravenous Once  . tamsulosin  0.4 mg Oral Daily    OBJECTIVE  Filed Vitals:   09/30/14 1952 09/30/14 2147 10/01/14 0056 10/01/14 0538  BP: 145/75 147/62  151/77  Pulse: 64 70 72 65  Temp: 98.2 F (36.8 C)   98 F (36.7 C)  TempSrc: Oral   Oral  Resp: 18  16 16   Height:      Weight:    259 lb 12.8 oz (117.845 kg)  SpO2: 99%  99% 94%    Intake/Output Summary (Last 24 hours) at 10/01/14 0741 Last data filed at 09/30/14 2148  Gross per 24 hour  Intake    360 ml  Output      0 ml  Net    360 ml   Filed Weights   09/30/14 0810 10/01/14 0538  Weight: 259 lb 14.4 oz (117.89 kg) 259 lb 12.8 oz (117.845 kg)    PHYSICAL EXAM  General: Pleasant, NAD. Neuro: Alert and oriented X 3. Moves all extremities spontaneously. Psych: Normal affect. HEENT:  Normal  Neck: Supple without bruits or JVD. Lungs:  Resp regular and unlabored, CTA. Heart: RRR no s3, s4, or murmurs. Abdomen: Soft, non-tender, non-distended, BS + x 4.  Extremities: No clubbing, cyanosis or edema. DP/PT/Radials 2+ and equal bilaterally.  Accessory  Clinical Findings  CBC  Recent Labs  09/30/14 0333  WBC 6.0  HGB 14.1  HCT 41.7  MCV 88.2  PLT 673   Basic Metabolic Panel  Recent Labs  09/30/14 0333  NA 139  K 3.6  CL 104  CO2 28  GLUCOSE 137*  BUN 18  CREATININE 1.07  CALCIUM 9.6   Cardiac Enzymes  Recent Labs  09/30/14 1330 09/30/14 1946 10/01/14 0105  TROPONINI <0.03 <0.03 <0.03    TELE NSR with occasional PVCs, however no a-fib    ECG  NSR with HR 60s, no significant ST-T wave changes  Echocardiogram 12/26/2012  - Left ventricle: The cavity size was normal. There was mild focal basal hypertrophy of the septum. Systolic function was normal. The estimated ejection fraction was in the range of 55% to 60%. Wall motion was normal; there were no regional wall motion abnormalities. Features are consistent with a pseudonormal left ventricular filling pattern, with concomitant abnormal relaxation and increased filling pressure (grade 2 diastolic dysfunction). - Left atrium: The atrium was mildly dilated.    Radiology/Studies  Dg Chest Port 1 View  09/30/2014   CLINICAL DATA:  Left-sided  chest pain.  EXAM: PORTABLE CHEST - 1 VIEW  COMPARISON:  12/11/2012  FINDINGS: Mild cardiac enlargement. Pulmonary vascularity appears normal. No focal airspace disease or consolidation in the lungs. No blunting of costophrenic angles. No pneumothorax. Mediastinal contours appear intact.  IMPRESSION: Mild cardiac enlargement.  No evidence of active pulmonary disease.   Electronically Signed   By: Lucienne Capers M.D.   On: 09/30/2014 04:02    ASSESSMENT AND PLAN  1. Chest pain  - serial trop negative. EKG no ischemic changes this morning  - negative myoview in 2014 with EF 73%  - had resting image yesterday, pending stress portion today. If negative, likely discharge this afternoon (after start on eliquis 5mg  BID)  2. DM  3. HTN  4. HLD  5. PAF: unclear if had recurrent a-fib which caused the  CP, currently sinus. CHADS VAsc 3, if myoview negative, will start eliquis 5mg  BID  6. COPD  7. Prostate CA  Signed, Almyra Deforest PA-C Pager: [phone]  Agree with note by Almyra Deforest PA-C  Ruled out by [facility]. NSR. Myoview results pending. Agree with Eliquis given H/O PAF and increased CHA2DSVASC2 score.Exam benign. Can D/C home later if Myoview neg. ROV with Dr. Lenore Cordia.  Lorretta Harp, M.D., [place], [facility], [place] [address] [place], [place]  [id number]  [phone] 10/01/2014 10:03 AM  addendum-Myoview appears normal. The patient will be discharged home and Eliquis  will be

## 2014-10-01 NOTE — Care Management Note (Addendum)
    Page 1 of 1   10/01/2014     1:09:36 PM CARE MANAGEMENT NOTE 10/01/2014  Patient:  [name], [name]   Account Number:  [id number]  Date Initiated:  10/01/2014  Documentation initiated by:  GRAVES-BIGELOW,Aleathea Pugmire  Subjective/Objective Assessment:   Pt admitted fr cp-plan stress test. Plan for d/c on eliquis. [place] has medication available.     Action/Plan:   Benefits check completed and co pay will be PT COPAY WILL BE $40 - PRIOR AUTH NOT REQUIRED. CM did provide pt with 30 day free card and will need Rx to go along with that. No further needs from CM at this time.   Anticipated DC Date:  10/01/2014   Anticipated DC Plan:  [place]  CM consult  Medication Assistance      Choice offered to / List presented to:             Status of service:  Completed, signed off Medicare Important Message given?  NO (If response is "NO", the following Medicare IM given date fields will be blank) Date Medicare IM given:   Medicare IM given by:   Date Additional Medicare IM given:   Additional Medicare IM given by:    Discharge Disposition:  HOME/SELF CARE  Per UR Regulation:  Reviewed for med. necessity/level of care/duration of stay  If discussed at [place] of Stay Meetings, dates discussed:    Comments:

## 2014-10-01 NOTE — Progress Notes (Signed)
UR completed 

## 2014-10-01 NOTE — Progress Notes (Signed)
Nuclear stress test neg for ischemia.  [place] for discharge.

## 2014-10-04 ENCOUNTER — Other Ambulatory Visit: Payer: Self-pay | Admitting: Cardiology

## 2014-10-04 NOTE — Telephone Encounter (Signed)
Rx refill sent to patient pharmacy  With note to make appt

## 2014-10-25 ENCOUNTER — Ambulatory Visit (INDEPENDENT_AMBULATORY_CARE_PROVIDER_SITE_OTHER): Payer: Medicare PPO | Admitting: Cardiology

## 2014-10-25 ENCOUNTER — Other Ambulatory Visit: Payer: Self-pay | Admitting: *Deleted

## 2014-10-25 VITALS — BP 148/76 | HR 66 | Ht 74.0 in | Wt 262.0 lb

## 2014-10-25 DIAGNOSIS — I481 Persistent atrial fibrillation: Secondary | ICD-10-CM

## 2014-10-25 DIAGNOSIS — I4819 Other persistent atrial fibrillation: Secondary | ICD-10-CM

## 2014-10-25 MED ORDER — APIXABAN 5 MG PO TABS: 5.0000 mg | ORAL_TABLET | Freq: Two times a day (BID) | ORAL | Status: DC

## 2014-10-25 MED ORDER — AMLODIPINE-OLMESARTAN 10-40 MG PO TABS: 1.0000 | ORAL_TABLET | Freq: Every day | ORAL | Status: DC

## 2014-10-25 MED ORDER — METOPROLOL TARTRATE 50 MG PO TABS: 50.0000 mg | ORAL_TABLET | Freq: Two times a day (BID) | ORAL | Status: DC

## 2014-10-25 MED ORDER — FLUTICASONE PROPIONATE 50 MCG/ACT NA SUSP: 2.0000 | Freq: Every day | NASAL | Status: DC

## 2014-10-25 MED ORDER — PRAVASTATIN SODIUM 40 MG PO TABS: 40.0000 mg | ORAL_TABLET | Freq: Every evening | ORAL | Status: DC

## 2014-10-25 NOTE — Patient Instructions (Addendum)
Your physician recommends that you schedule a follow-up appointment in: one year with Dr. Percival Spanish  Start taking pravastatin 40 mg as directed   Stop taking your aspirin

## 2014-10-25 NOTE — Progress Notes (Signed)
HPI The patient is new to me.   He has a history recently diagnosed up paroxysmal atrial fibrillation. He has no prior cardiac history. He was apparently noticed to have some PAF on an EKG at [facility] noted prior to having liver surgery. However, this was apparently very short in duration.   He did have a liver surgery and postoperatively had rapid atrial fibrillation. The liver lesion turned out to be benign.  He did have a stress perfusion study with normal ejection fraction. He has had an echocardiogram with some very mild septal hypertrophy.  Since he was in the hospital he's had no further chest discomfort, neck or arm discomfort. He's had no palpitations, presyncope or syncope. He does occasionally get dyspnea with activity such as climbing stairs. He denies any PND or orthopnea. He has only mild edema. He's not exercising routinely.   Allergies  Allergen Reactions  . Benazepril Hcl Other (See Comments) and Cough  . Sildenafil     Unknown    Current Outpatient Prescriptions  Medication Sig Dispense Refill  . amLODipine-olmesartan (AZOR) 10-40 MG per tablet Take 1 tablet by mouth daily. 90 tablet 3  . apixaban (ELIQUIS) 5 MG TABS tablet Take 1 tablet (5 mg total) by mouth 2 (two) times daily. 180 tablet 3  . aspirin (BAYER ASPIRIN) 325 MG tablet Take 1 tablet (325 mg total) by mouth daily. 100 tablet 3  . fluticasone (FLONASE) 50 MCG/ACT nasal spray Place 2 sprays into both nostrils daily. 16 g 6  . furosemide (LASIX) 20 MG tablet Take 1-2 tablets (20-40 mg total) by mouth daily as needed for edema. 60 tablet 5  . metoprolol (LOPRESSOR) 50 MG tablet Take 1 tablet (50 mg total) by mouth 2 (two) times daily. *MUST MAKE APPOINTMENT* 180 tablet 3  . Multiple Vitamin (MULTIVITAMIN) tablet Take 1 tablet by mouth daily.    . NON FORMULARY C-PAP at night    . pioglitazone-metformin (ACTOPLUS MET) 15-850 MG per tablet Take 1 tablet by mouth 2 (two) times daily with a meal. 180 tablet 3  .  tamsulosin (FLOMAX) 0.4 MG CAPS capsule Take 0.4 mg by mouth daily.    . traMADol (ULTRAM) 50 MG tablet Take 50 mg by mouth every 6 (six) hours as needed for pain.     No current facility-administered medications for this visit.    Past Medical History  Diagnosis Date  . Hypertension   . LBP (low back pain)   . Osteoarthritis   . ED (erectile dysfunction)   . Diabetes mellitus type II   . Hyperlipidemia   . COPD, moderate     on PFT 2010 due to previous 20y h/o smoking  . Sleep apnea   . Colon polyp   . Cancer 3/14    7/14 s/p XRT seeds in [place] ([facility])  . Atrial fibrillation     Past Surgical History  Procedure Laterality Date  . Back surgery    . Total knee arthroplasty Left 06-2012  . Vasectomy    . Liver surgery    . Prostate seeds      ROS:  As stated in the HPI and negative for all other systems.  PHYSICAL EXAM BP 148/76 mmHg  Pulse 66  Ht '6\' 2"'  (1.88 m)  Wt 262 lb (118.842 kg)  BMI 33.62 kg/m2 GENERAL:  Well appearing HEENT:  Pupils equal round and reactive, fundi not visualized, oral mucosa unremarkable NECK:  No jugular venous distention, waveform within normal  limits, carotid upstroke brisk and symmetric, no bruits, no thyromegaly LYMPHATICS:  No cervical, inguinal adenopathy LUNGS:  Clear to auscultation bilaterally BACK:  No CVA tenderness CHEST:  Unremarkable HEART:  PMI not displaced or sustained,S1 and S2 within normal limits, no S3, no S4, no clicks, no rubs, no murmurs ABD:  Flat, positive bowel sounds normal in frequency in pitch, no bruits, no rebound, no guarding, no midline pulsatile mass, no hepatomegaly, no splenomegaly,  Large stapled surgical wound EXT:  2 plus pulses throughout,  Mild to moderate bilateral lower extremityedema, no cyanosis no clubbing SKIN:  No rashes no nodules NEURO:  Cranial nerves II through XII grossly intact, motor grossly intact throughout PSYCH:  Cognitively intact, oriented to person place  and time   ASSESSMENT AND PLAN   ATRIAL FIBRILLATION:  [name] has a CHA2DS2 - VASc score of 3.   He is now on Eliquis.   We discussed the risk benefits of this at length. He should not be taking an aspirin. We discussed this at length.  EDEMA:  This is mild. No change in therapy is indicated.  HTN:  I did review some recent blood pressure readings typically runs within target. We discussed therapeutic lifestyle changes.  OBESITY:  The patient understands the need to lose weight with diet and exercise. We have discussed specific strategies for this.  I gave him specific instructions on weight loss.  DYSLIPIDEMIA:  I think he should be on a statin. I have taken the liberty of starting pravastatin 40 mg daily. I think the goal LDL should be at least less than 100. I would certainly defer however to [name] Kehr, MD

## 2014-10-26 ENCOUNTER — Encounter: Payer: Self-pay | Admitting: Cardiology

## 2014-11-11 ENCOUNTER — Ambulatory Visit (INDEPENDENT_AMBULATORY_CARE_PROVIDER_SITE_OTHER): Payer: Medicare PPO | Admitting: Internal Medicine

## 2014-11-11 ENCOUNTER — Encounter: Payer: Self-pay | Admitting: Internal Medicine

## 2014-11-11 ENCOUNTER — Ambulatory Visit: Payer: TRICARE For Life (TFL) | Admitting: Internal Medicine

## 2014-11-11 VITALS — BP 140/68 | HR 67 | Temp 97.8°F | Ht 74.0 in | Wt 264.0 lb

## 2014-11-11 DIAGNOSIS — J0100 Acute maxillary sinusitis, unspecified: Secondary | ICD-10-CM

## 2014-11-11 MED ORDER — AZITHROMYCIN 250 MG PO TABS: ORAL_TABLET | ORAL | Status: DC

## 2014-11-11 NOTE — Patient Instructions (Signed)
Use over-the-counter  "cold" medicines  such as "Afrin" nasal spray for nasal congestion as directed instead. Use" Delsym" or" Robitussin" cough syrup varietis for cough.  You can use plain "Tylenol" or "Advil" for fever, chills and achyness. Use Halls or Ricola cough drops.   "Common cold" symptoms are usually triggered by a virus.  The antibiotics are usually not necessary. On average, a" viral cold" illness would take 4-7 days to resolve. Please, make an appointment if you are not better or if you're worse.  

## 2014-11-11 NOTE — Progress Notes (Signed)
Subjective:    HPI  C/o URI sx's, HA and sinus congestion x a few days  The patient presents for a follow-up of  chronic hypertension, chronic dyslipidemia, type 2 diabetes controlled with medicines. F/u new prostate ca (s/p XRT seeds 2014) and liver cyst (in the process of work up). He is s/p liver mass resection at [facility] - benign 2014. On CPAP for OSA  He is s/p L TKR 10/13 - better. No arrhythmias  BP Readings from Last 3 Encounters:  11/11/14 140/68  10/25/14 148/76  10/01/14 134/63   Wt Readings from Last 3 Encounters:  11/11/14 264 lb (119.75 kg)  10/25/14 262 lb (118.842 kg)  10/01/14 259 lb 12.8 oz (117.845 kg)      Review of Systems  Constitutional: Negative for appetite change, fatigue and unexpected weight change.  HENT: Negative for congestion, nosebleeds, sneezing, sore throat and trouble swallowing.   Eyes: Negative for itching and visual disturbance.  Respiratory: Negative for cough.   Cardiovascular: Negative for chest pain, palpitations and leg swelling.  Gastrointestinal: Negative for nausea, diarrhea, blood in stool and abdominal distention.  Genitourinary: Negative for frequency and hematuria.  Musculoskeletal: Positive for joint swelling (Knees, L heel) and gait problem. Negative for back pain and neck pain.  Skin: Negative for rash.  Neurological: Negative for dizziness, tremors, speech difficulty and weakness.  Psychiatric/Behavioral: Negative for sleep disturbance, dysphoric mood and agitation. The patient is not nervous/anxious.        Objective:   Physical Exam  Constitutional: He is oriented to person, place, and time. He appears well-developed. No distress.  NAD  HENT:  Mouth/Throat: Oropharynx is clear and moist.  Eyes: Conjunctivae are normal. Pupils are equal, round, and reactive to light.  Neck: Normal range of motion. No JVD present. No thyromegaly present.  Cardiovascular: Normal rate, regular rhythm, normal heart sounds and intact  distal pulses.  Exam reveals no gallop and no friction rub.   No murmur heard. Pulmonary/Chest: Effort normal and breath sounds normal. No respiratory distress. He has no wheezes. He has no rales. He exhibits no tenderness.  Abdominal: Soft. Bowel sounds are normal. He exhibits no distension and no mass. There is no tenderness. There is no rebound and no guarding.  Musculoskeletal: Normal range of motion. He exhibits edema. He exhibits no tenderness.  Lymphadenopathy:    He has no cervical adenopathy.  Neurological: He is alert and oriented to person, place, and time. He has normal reflexes. No cranial nerve deficit. He exhibits normal muscle tone. He displays a negative Romberg sign. Coordination and gait normal.  No meningeal signs  Skin: Skin is warm and dry. No rash noted.  Psychiatric: He has a normal mood and affect. His behavior is normal. Judgment and thought content normal.  Abd scar is healed - large B edema none to trace  Lab Results  Component Value Date   WBC 6.0 09/30/2014   HGB 14.1 09/30/2014   HCT 41.7 09/30/2014   PLT 264 09/30/2014   GLUCOSE 137* 09/30/2014   CHOL 185 07/13/2014   TRIG 58.0 07/13/2014   HDL 45.50 07/13/2014   LDLDIRECT 125.4 07/14/2008   LDLCALC 128* 07/13/2014   ALT 14 03/17/2014   AST 21 03/17/2014   NA 139 09/30/2014   K 3.6 09/30/2014   CL 104 09/30/2014   CREATININE 1.07 09/30/2014   BUN 18 09/30/2014   CO2 28 09/30/2014   TSH 2.15 09/11/2012   PSA 0.50 03/17/2014   HGBA1C 6.0 07/13/2014  Assessment & Plan:  Patient ID: [name] [name], male   DOB: June 24, 1951, 64 y.o.   MRN: [id number]

## 2014-11-11 NOTE — Progress Notes (Signed)
Pre visit review using our clinic review tool, if applicable. No additional management support is needed unless otherwise documented below in the visit note. 

## 2014-11-11 NOTE — Assessment & Plan Note (Signed)
3/16 viral vs bacterial Zpac if worse

## 2014-11-18 ENCOUNTER — Encounter: Payer: Self-pay | Admitting: Physician Assistant

## 2014-11-18 ENCOUNTER — Ambulatory Visit (INDEPENDENT_AMBULATORY_CARE_PROVIDER_SITE_OTHER): Payer: Medicare PPO | Admitting: Physician Assistant

## 2014-11-18 VITALS — BP 148/70 | HR 72 | Ht 74.0 in | Wt 262.1 lb

## 2014-11-18 DIAGNOSIS — Z7901 Long term (current) use of anticoagulants: Secondary | ICD-10-CM | POA: Diagnosis not present

## 2014-11-18 DIAGNOSIS — Z86010 Personal history of colonic polyps: Secondary | ICD-10-CM

## 2014-11-18 DIAGNOSIS — Z800 Family history of malignant neoplasm of digestive organs: Secondary | ICD-10-CM | POA: Diagnosis not present

## 2014-11-18 DIAGNOSIS — I4891 Unspecified atrial fibrillation: Secondary | ICD-10-CM | POA: Diagnosis not present

## 2014-11-18 MED ORDER — MOVIPREP 100 G PO SOLR: 1.0000 | Freq: Once | ORAL | Status: DC

## 2014-11-18 NOTE — Progress Notes (Signed)
Patient ID: [name] [name], male   DOB: [birth year], 64 y.o.   MRN: [id number]     History of Present Illness: [name] [name] is a 64 year old male with a history of hypertension, diabetes, hyperlipidemia, osteoarthritis, COPD, and advanced adenomatosis. 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 He does have a family history of colon cancer and his parents at age 51. He had seen urology and was found to have prostate cancer treated with brachial therapy. He also had a CT scan in February 2014 that revealed a 9.2 cm right hepatic lobe cyst posterior liver. He underwent total right lobectomy of the liver in October 2014 and the lesion was found to be benign. Postoperatively he was diagnosed with paroxysmal atrial fibrillation. This was apparently very short in duration. He was advised to take a baby aspirin a day. More recently he had an episode where he woke up in the middle of the night feeling short of breath. He was again found to have atrial fibrillation and was seen by Dr. Percival Spanish and started on Eliquis. He is here today to schedule his surveillance colonoscopy. He he has had no change in his bowel habits or stool caliber. He denies any bloody or tarry stools. He has not had any anorexia or unexplained weight loss.   Past Medical History  Diagnosis Date  . Hypertension   . LBP (low back pain)   . Osteoarthritis   . ED (erectile dysfunction)   . Diabetes mellitus type II   . Hyperlipidemia   . COPD, moderate     on PFT 2010 due to previous 20y h/o smoking  . Sleep apnea   . Colon polyp   . Cancer 3/14    7/14 s/p XRT seeds in [place] ([facility])  . Atrial fibrillation     Past Surgical History  Procedure Laterality Date  . Back surgery    . Total knee arthroplasty Left  06-2012  . Vasectomy    . Liver surgery    . Prostate seeds     Family History  Problem Relation Age of Onset  . Diabetes Mother   . Heart disease Mother     Pacemaker  . Pancreatic cancer Father   . Hypertension Other   . Stomach cancer Father   . Stomach cancer Sister   . Colon cancer Neg Hx    History  Substance Use Topics  . Smoking status: Former Smoker    Types: Cigarettes    Quit date: 09/04/1999  . Smokeless tobacco: Never Used  . Alcohol Use: 0.0 oz/week    0 Standard drinks or equivalent per week     Comment: Occasional   Current Outpatient Prescriptions  Medication Sig Dispense Refill  . amLODipine-olmesartan (AZOR) 10-40 MG per tablet Take 1 tablet by mouth daily. 90 tablet 3  . apixaban (ELIQUIS) 5 MG TABS tablet Take 1 tablet (5 mg total) by mouth 2 (two) times daily. 180 tablet 3  . azithromycin (ZITHROMAX) 250 MG tablet As directed 6 tablet 0  . fluticasone (FLONASE) 50 MCG/ACT nasal spray Place 2 sprays into both nostrils daily. 16 g 6  . furosemide (LASIX) 20 MG tablet Take 1-2 tablets (20-40 mg total) by mouth daily as needed for edema. 60 tablet 5  . metoprolol (  LOPRESSOR) 50 MG tablet Take 1 tablet (50 mg total) by mouth 2 (two) times daily. *MUST MAKE APPOINTMENT* 180 tablet 3  . Multiple Vitamin (MULTIVITAMIN) tablet Take 1 tablet by mouth daily.    . NON FORMULARY C-PAP at night    . pioglitazone-metformin (ACTOPLUS MET) 15-850 MG per tablet Take 1 tablet by mouth 2 (two) times daily with a meal. 180 tablet 3  . pravastatin (PRAVACHOL) 40 MG tablet Take 1 tablet (40 mg total) by mouth every evening. 90 tablet 3  . tamsulosin (FLOMAX) 0.4 MG CAPS capsule Take 0.4 mg by mouth daily.    . traMADol (ULTRAM) 50 MG tablet Take 50 mg by mouth every 6 (six) hours as needed for pain.    Marland Kitchen MOVIPREP 100 G SOLR Take 1 kit (200 g total) by mouth once. 1 kit 0   No current facility-administered medications for this visit.   Allergies  Allergen Reactions  .  Benazepril Hcl Other (See Comments) and Cough  . Sildenafil     Unknown      Review of Systems: Gen: Denies any fever, chills, sweats, anorexia, fatigue, weakness, malaise, weight loss, and sleep disorder CV: Denies chest pain, angina, palpitations, syncope, orthopnea, PND, peripheral edema, and claudication. Resp: Denies dyspnea at rest, dyspnea with exercise, cough, sputum, wheezing, coughing up blood, and pleurisy. GI: Denies vomiting blood, jaundice, and fecal incontinence.   Denies dysphagia or odynophagia. GU : Denies urinary burning, blood in urine, urinary frequency, urinary hesitancy, nocturnal urination, and urinary incontinence. MS: Denies joint pain, limitation of movement, and swelling, stiffness, low back pain, extremity pain. Denies muscle weakness, cramps, atrophy.  Derm: Denies rash, itching, dry skin, hives, moles, warts, or unhealing ulcers.  Psych: Denies depression, anxiety, memory loss, suicidal ideation, hallucinations, paranoia, and confusion. Heme: Denies bruising, bleeding, and enlarged lymph nodes. Neuro:  Denies any headaches, dizziness, paresthesia Endo:  Denies any problems with DM, thyroid, adrenal   Physical Exam: General: Pleasant, well developed black male in no acute distress Head: Normocephalic and atraumatic Eyes:  sclerae anicteric, conjunctiva pink  Ears: Normal auditory acuity Lungs: Clear throughout to auscultation Heart: Regular rate and rhythm Abdomen: Soft, non distended, non-tender. No masses, no hepatomegaly. Normal bowel sounds Musculoskeletal: Symmetrical with no gross deformities  Extremities: No edema  Neurological: Alert oriented x 4, grossly nonfocal Psychological:  Alert and cooperative. Normal mood and affect  Assessment and Recommendations: Asymptomatic 64 year old male with a personal history of tubulovillous adenoma, here to schedule surveillance colonoscopy to evaluate for recurrent polyps or neoplasia. The patient was  recently diagnosed with atrial fibrillation which has been persistent, and was recently started on Eliquis. The patient is to be scheduled for his surveillance colonoscopy.The risks, benefits, and alternatives to colonoscopy with possible biopsy and possible polypectomy were discussed with the patient and they consent to proceed. Will hold eliquis  2 days prior to endoscopic procedures - will instruct when and how to resume after procedure. Benefits and risks of procedure explained including risks of bleeding, perforation, infection, missed lesions, reactions to medications and possible need for hospitalization and surgery for complications. Additional rare but real risk of stroke or other vascular clotting events off eliquis also explained and need to seek urgent help if any signs of these problems occur. Will communicate by phone or EMR with patient's  prescribing provider to confirm that holding eliquis is reasonable in this case.      Luis Sami, Deloris Ping 11/18/2014,

## 2014-11-18 NOTE — Progress Notes (Signed)
Agree with initial assessment and plans as outlined. Cecille Rubin, I would recommend holding Eliquis for 3 days as his GFR is lower than normal. Please confirm with his cardiologist that this is acceptable. I would recommend a baby aspirin during the time that he is off Eliquis.

## 2014-11-18 NOTE — Patient Instructions (Signed)
You have been scheduled for a colonoscopy. Please follow written instructions given to you at your visit today.  Please pick up your prep supplies at the pharmacy within the next 1-3 days. If you use inhalers (even only as needed), please bring them with you on the day of your procedure. Your physician has requested that you go to www.startemmi.com and enter the access code given to you at your visit today. This web site gives a general overview about your procedure. However, you should still follow specific instructions given to you by our office regarding your preparation for the procedure. You will be contacted by our office prior to your procedure for directions on holding your Eliquis.  If you do not hear from our office 1 week prior to your scheduled procedure, please call [phone] to discuss. CC:  A Plotnikov MD

## 2014-11-19 ENCOUNTER — Telehealth: Payer: Self-pay

## 2014-11-19 NOTE — Telephone Encounter (Signed)
Left message on machine to call back  

## 2014-11-19 NOTE — Telephone Encounter (Signed)
-----   Message from Vita Barley Hvozdovic, PA-C sent at 11/18/2014  5:00 PM EDT ----- Precious Bard, can you please let pt know 3 days off eliquis and send new note to cardiology. Have pt use a baby ASA on the days he is off eleiquis. ----- Message -----    From: Irene Shipper, MD    Sent: 11/18/2014   1:48 PM      To: Vita Barley Hvozdovic, PA-C    ----- Message -----    From: Vita Barley Hvozdovic, PA-C    Sent: 11/18/2014  12:44 PM      To: Irene Shipper, MD

## 2014-11-19 NOTE — Telephone Encounter (Signed)
OK to hold Eliquis for 3 days prior to the procedure. You will need to turn this into an official part of the chart for documentation. No bridging anticoagulation is needed.          ----- Message -----     From: Barron Alvine, CMA     Sent: 11/18/2014 10:06 AM      To: Minus Breeding, MD

## 2014-11-19 NOTE — Telephone Encounter (Signed)
The patient has been notified of this information and all questions answered.

## 2014-12-03 ENCOUNTER — Other Ambulatory Visit: Payer: Self-pay | Admitting: Internal Medicine

## 2014-12-07 ENCOUNTER — Encounter: Payer: Self-pay | Admitting: Internal Medicine

## 2014-12-17 ENCOUNTER — Telehealth: Payer: Self-pay | Admitting: Cardiology

## 2014-12-17 NOTE — Telephone Encounter (Signed)
Needs to stop the ASA and get a complete work up of anemia by the primary MD.  See me after the work up and prior to starting anticoagulation back.

## 2014-12-17 NOTE — Telephone Encounter (Signed)
Pt put on Eliquis ~2 months ago following hospitalization w/ CP, PAF.  Pt notes he had drop of blood in clothing ~3 weeks ago. Noted this when seen by Dr. Elisabeth Cara at the [facility] on Tuesday.  She ran CBC on patient, Hgb result was 8.4. It was noted his Hgb ~1 yr ago was 14.0  CBC rechecked today, results was apparently about the same.  Dr. Elisabeth Cara wanted to d/c patient's Eliquis, he requested advice from Dr. Percival Spanish before doing so.  Will defer.

## 2014-12-17 NOTE — Telephone Encounter (Signed)
Left message to call.

## 2014-12-17 NOTE — Telephone Encounter (Signed)
Spoke w/ patient, thorough conversation regarding his medication and acute concerns, pt states concerns adequately addressed and thankful for the callback.

## 2014-12-17 NOTE — Telephone Encounter (Signed)
[name] [name] is calling because he is taking Eliquis and is now seeing blood in his stool , notice it about 3wks ago. Want to know should he continue to take it . Please call    Thanks

## 2014-12-21 ENCOUNTER — Ambulatory Visit (INDEPENDENT_AMBULATORY_CARE_PROVIDER_SITE_OTHER): Payer: Medicare PPO | Admitting: Internal Medicine

## 2014-12-21 ENCOUNTER — Encounter: Payer: Self-pay | Admitting: Internal Medicine

## 2014-12-21 ENCOUNTER — Other Ambulatory Visit (INDEPENDENT_AMBULATORY_CARE_PROVIDER_SITE_OTHER): Payer: Medicare PPO

## 2014-12-21 VITALS — BP 130/56 | HR 79 | Wt 263.0 lb

## 2014-12-21 DIAGNOSIS — K625 Hemorrhage of anus and rectum: Secondary | ICD-10-CM | POA: Diagnosis not present

## 2014-12-21 DIAGNOSIS — D500 Iron deficiency anemia secondary to blood loss (chronic): Secondary | ICD-10-CM | POA: Insufficient documentation

## 2014-12-21 DIAGNOSIS — D62 Acute posthemorrhagic anemia: Secondary | ICD-10-CM

## 2014-12-21 DIAGNOSIS — I480 Paroxysmal atrial fibrillation: Secondary | ICD-10-CM

## 2014-12-21 DIAGNOSIS — K922 Gastrointestinal hemorrhage, unspecified: Secondary | ICD-10-CM | POA: Insufficient documentation

## 2014-12-21 DIAGNOSIS — E118 Type 2 diabetes mellitus with unspecified complications: Secondary | ICD-10-CM

## 2014-12-21 LAB — CBC WITH DIFFERENTIAL/PLATELET
BASOS PCT: 0.4 % (ref 0.0–3.0)
Basophils Absolute: 0 10*3/uL (ref 0.0–0.1)
Eosinophils Absolute: 0.1 10*3/uL (ref 0.0–0.7)
Eosinophils Relative: 2 % (ref 0.0–5.0)
HEMATOCRIT: 27 % — AB (ref 39.0–52.0)
LYMPHS ABS: 1 10*3/uL (ref 0.7–4.0)
Lymphocytes Relative: 19.2 % (ref 12.0–46.0)
MCHC: 32 g/dL (ref 30.0–36.0)
MCV: 78.6 fl (ref 78.0–100.0)
MONOS PCT: 10.8 % (ref 3.0–12.0)
Monocytes Absolute: 0.6 10*3/uL (ref 0.1–1.0)
NEUTROS ABS: 3.6 10*3/uL (ref 1.4–7.7)
Neutrophils Relative %: 67.6 % (ref 43.0–77.0)
Platelets: 377 10*3/uL (ref 150.0–400.0)
RBC: 3.44 Mil/uL — AB (ref 4.22–5.81)
RDW: 17.2 % — ABNORMAL HIGH (ref 11.5–15.5)
WBC: 5.4 10*3/uL (ref 4.0–10.5)

## 2014-12-21 LAB — PROTIME-INR
INR: 1.2 ratio — ABNORMAL HIGH (ref 0.8–1.0)
Prothrombin Time: 12.9 s (ref 9.6–13.1)

## 2014-12-21 LAB — HEPATIC FUNCTION PANEL
ALT: 17 U/L (ref 0–53)
AST: 18 U/L (ref 0–37)
Albumin: 3.7 g/dL (ref 3.5–5.2)
Alkaline Phosphatase: 73 U/L (ref 39–117)
BILIRUBIN DIRECT: 0.2 mg/dL (ref 0.0–0.3)
Total Bilirubin: 0.7 mg/dL (ref 0.2–1.2)
Total Protein: 6.8 g/dL (ref 6.0–8.3)

## 2014-12-21 LAB — BASIC METABOLIC PANEL
BUN: 16 mg/dL (ref 6–23)
CO2: 31 meq/L (ref 19–32)
CREATININE: 1.05 mg/dL (ref 0.40–1.50)
Calcium: 9.1 mg/dL (ref 8.4–10.5)
Chloride: 107 mEq/L (ref 96–112)
GFR: 91.09 mL/min (ref >60.00)
Glucose, Bld: 130 mg/dL — ABNORMAL HIGH (ref 70–99)
POTASSIUM: 3.9 meq/L (ref 3.5–5.1)
Sodium: 141 mEq/L (ref 135–145)

## 2014-12-21 LAB — IBC PANEL
IRON: 48 ug/dL (ref 42–165)
SATURATION RATIOS: 11.2 % — AB (ref 20.0–50.0)
TRANSFERRIN: 305 mg/dL (ref 212.0–360.0)

## 2014-12-21 LAB — HEMOGLOBIN A1C: Hgb A1c MFr Bld: 5.9 % (ref 4.6–6.5)

## 2014-12-21 NOTE — Assessment & Plan Note (Signed)
4/16: low hgb 8.4 last Tue at the [facility] Pt had rectal bleeding prior). Eliquis was stopped by Dr Conley Canal. Colon sch for 5/15 here...  Check CBC, iron

## 2014-12-21 NOTE — Assessment & Plan Note (Signed)
4/16: low hgb 8.4 last Tue at the [place] (Pt had rectal bleeding prior). Eliquis was stopped by Dr Conley Canal. Colon sch for 5/15 here.Marland KitchenMarland Kitchen

## 2014-12-21 NOTE — Assessment & Plan Note (Signed)
Labs On Metformin 

## 2014-12-21 NOTE — Progress Notes (Signed)
Pre visit review using our clinic review tool, if applicable. No additional management support is needed unless otherwise documented below in the visit note. 

## 2014-12-21 NOTE — Progress Notes (Signed)
Subjective:    HPI  C/o low hgb 8.4 last Tue at the [facility] Pt had rectal bleeding prior). Eliquis was stopped by Dr Conley Canal. Colon sch for 5/15 here...  The patient presents for a follow-up of  chronic hypertension, chronic dyslipidemia, type 2 diabetes controlled with medicines. F/u new prostate ca (s/p XRT seeds 2014) and liver cyst (in the process of work up). He is s/p liver mass resection at [facility] - benign 2014. On CPAP for OSA  He is s/p L TKR 10/13 - better. No arrhythmias  BP Readings from Last 3 Encounters:  12/21/14 130/56  11/18/14 148/70  11/11/14 140/68   Wt Readings from Last 3 Encounters:  12/21/14 263 lb (119.296 kg)  11/18/14 262 lb 2 oz (118.899 kg)  11/11/14 264 lb (119.75 kg)      Review of Systems  Constitutional: Negative for appetite change, fatigue and unexpected weight change.  HENT: Negative for congestion, nosebleeds, sneezing, sore throat and trouble swallowing.   Eyes: Negative for itching and visual disturbance.  Respiratory: Negative for cough.   Cardiovascular: Negative for chest pain, palpitations and leg swelling.  Gastrointestinal: Negative for nausea, diarrhea, blood in stool and abdominal distention.  Genitourinary: Negative for frequency and hematuria.  Musculoskeletal: Positive for joint swelling (Knees, L heel) and gait problem. Negative for back pain and neck pain.  Skin: Negative for rash.  Neurological: Negative for dizziness, tremors, speech difficulty and weakness.  Psychiatric/Behavioral: Negative for sleep disturbance, dysphoric mood and agitation. The patient is not nervous/anxious.        Objective:   Physical Exam  Constitutional: He is oriented to person, place, and time. He appears well-developed. No distress.  NAD  HENT:  Mouth/Throat: Oropharynx is clear and moist.  Eyes: Conjunctivae are normal. Pupils are equal, round, and reactive to light.  Neck: Normal range of motion. No JVD present. No thyromegaly present.   Cardiovascular: Normal rate, regular rhythm, normal heart sounds and intact distal pulses.  Exam reveals no gallop and no friction rub.   No murmur heard. Pulmonary/Chest: Effort normal and breath sounds normal. No respiratory distress. He has no wheezes. He has no rales. He exhibits no tenderness.  Abdominal: Soft. Bowel sounds are normal. He exhibits no distension and no mass. There is no tenderness. There is no rebound and no guarding.  Musculoskeletal: Normal range of motion. He exhibits edema. He exhibits no tenderness.  Lymphadenopathy:    He has no cervical adenopathy.  Neurological: He is alert and oriented to person, place, and time. He has normal reflexes. No cranial nerve deficit. He exhibits normal muscle tone. He displays a negative Romberg sign. Coordination and gait normal.  No meningeal signs  Skin: Skin is warm and dry. No rash noted.  Psychiatric: He has a normal mood and affect. His behavior is normal. Judgment and thought content normal.  Abd scar is healed - large B edema none to trace  Lab Results  Component Value Date   WBC 6.0 09/30/2014   HGB 14.1 09/30/2014   HCT 41.7 09/30/2014   PLT 264 09/30/2014   GLUCOSE 137* 09/30/2014   CHOL 185 07/13/2014   TRIG 58.0 07/13/2014   HDL 45.50 07/13/2014   LDLDIRECT 125.4 07/14/2008   LDLCALC 128* 07/13/2014   ALT 14 03/17/2014   AST 21 03/17/2014   NA 139 09/30/2014   K 3.6 09/30/2014   CL 104 09/30/2014   CREATININE 1.07 09/30/2014   BUN 18 09/30/2014   CO2 28 09/30/2014  TSH 2.15 09/11/2012   PSA 0.50 03/17/2014   HGBA1C 6.0 07/13/2014        Assessment & Plan:

## 2014-12-21 NOTE — Assessment & Plan Note (Signed)
4/16: low hgb 8.4 last Tue at the [facility] Pt had rectal bleeding prior). Eliquis was stopped by Dr Conley Canal. Colon sch for 5/15 here.Marland KitchenMarland Kitchen

## 2014-12-24 ENCOUNTER — Encounter: Payer: Medicare PPO | Admitting: Internal Medicine

## 2015-01-04 ENCOUNTER — Other Ambulatory Visit: Payer: Self-pay | Admitting: *Deleted

## 2015-01-04 MED ORDER — SILDENAFIL CITRATE 20 MG PO TABS: 20.0000 mg | ORAL_TABLET | Freq: Every day | ORAL | Status: DC | PRN

## 2015-01-04 NOTE — Telephone Encounter (Signed)
Pt is requesting a generic version of Viagra in the place of Cialis. Sildenafil 20 mg ok?Cash price of 50 tabs for $80.   Please advise sig and quantity.

## 2015-01-04 NOTE — Telephone Encounter (Signed)
Ok Sildanafil 20 mg #50 w/3 ref Take 2-5 tabs prn Thx

## 2015-01-04 NOTE — Telephone Encounter (Signed)
Sent rx to [facility]...[name]

## 2015-01-13 ENCOUNTER — Encounter: Payer: Self-pay | Admitting: Internal Medicine

## 2015-01-13 ENCOUNTER — Ambulatory Visit (AMBULATORY_SURGERY_CENTER): Payer: Medicare PPO | Admitting: Internal Medicine

## 2015-01-13 ENCOUNTER — Other Ambulatory Visit: Payer: Self-pay | Admitting: Internal Medicine

## 2015-01-13 VITALS — BP 153/92 | HR 72 | Temp 96.4°F | Resp 27 | Ht 74.0 in | Wt 262.0 lb

## 2015-01-13 DIAGNOSIS — C20 Malignant neoplasm of rectum: Secondary | ICD-10-CM

## 2015-01-13 DIAGNOSIS — D129 Benign neoplasm of anus and anal canal: Secondary | ICD-10-CM

## 2015-01-13 DIAGNOSIS — D128 Benign neoplasm of rectum: Secondary | ICD-10-CM

## 2015-01-13 DIAGNOSIS — Z86010 Personal history of colonic polyps: Secondary | ICD-10-CM | POA: Diagnosis not present

## 2015-01-13 LAB — GLUCOSE, CAPILLARY
GLUCOSE-CAPILLARY: 102 mg/dL — AB (ref 65–99)
Glucose-Capillary: 110 mg/dL — ABNORMAL HIGH (ref 65–99)

## 2015-01-13 MED ORDER — SODIUM CHLORIDE 0.9 % IV SOLN: 500.0000 mL | INTRAVENOUS | Status: DC

## 2015-01-13 NOTE — Op Note (Signed)
[place]  [address] [place], [id number]   COLONOSCOPY PROCEDURE REPORT  PATIENT: [name] [name], [name] [name]  MR#: [id number] BIRTHDATE: [birth year] , 64  yrs. old GENDER: male ENDOSCOPIST: Eustace Quail, MD REFERRED YI:FOYDXAJOINOM Program Recall PROCEDURE DATE:  01/13/2015 PROCEDURE:   Colonoscopy, surveillance and Colonoscopy with snare polypectomy x 1 First Screening Colonoscopy - Avg.  risk and is 50 yrs.  old or older - No.  Prior Negative Screening - Now for repeat screening. N/A  History of Adenoma - Now for follow-up colonoscopy & has been > or = to 3 yrs.  Yes hx of adenoma.  Has been 3 or more years since last colonoscopy.  Polyps removed today = YES ASA CLASS:   Class II INDICATIONS:Surveillance due to prior colonic neoplasia, FH Colon (parent 84s) or Rectal Adenocarcinoma, and PH Colon Adenoma. Multiple prior colonoscopies with multiple, large, and tubulovillous lesions. Prior examinations 2003, 2008, and 2010. Most recent exam with large distal rectal tubulovillous adenoma. Overdue for follow-up.Marland Kitchen MEDICATIONS: Monitored anesthesia care and Propofol 450 mg IV  DESCRIPTION OF PROCEDURE:   After the risks benefits and alternatives of the procedure were thoroughly explained, informed consent was obtained.  The digital rectal exam revealed a palpable posterior rectal polyp at the anal verge.   The LB [id number] endoscope was introduced through the anus and advanced to the cecum, which was identified by both the appendix and ileocecal valve. No adverse events experienced.   The quality of the prep was excellent.  (MoviPrep was used)  The instrument was then slowly withdrawn as the colon was fully examined.  COLON FINDINGS: A semi-pedunculated polyp measuring 25 mm in size was found in the posterior rectum, beginning at the dentate line. This was removed piecemeal with snare cautery. The base was somewhat firm.  A polypectomy was performed with a  cold snare.  The resection was complete, the polyp tissue was completely retrieved and sent to histology.   The examination was otherwise normal. Polypoid lesion. The time to cecum = 2.6 Withdrawal time = 13.0 The scope was withdrawn and the procedure completed. COMPLICATIONS: There were no immediate complications.  ENDOSCOPIC IMPRESSION: 1.   Large posterior rectalSemi-pedunculated polyp was found in the rectum, at the dentate line; polypectomy was performed with a cold snare 2.   The examination was otherwise normal  RECOMMENDATIONS: 1. Await pathology results to determine follow-up plans.  eSigned:  Eustace Quail, MD 01/13/2015 11:50 AM   cc: The Patient and [place].  Plotnikov, MD

## 2015-01-13 NOTE — Patient Instructions (Signed)
Impressions/recommendations:  Polyp (handout given) Await pathology results for follow up plans.  YOU HAD AN ENDOSCOPIC PROCEDURE TODAY AT [facility]:   Refer to the procedure report that was given to you for any specific questions about what was found during the examination.  If the procedure report does not answer your questions, please call your gastroenterologist to clarify.  If you requested that your care partner not be given the details of your procedure findings, then the procedure report has been included in a sealed envelope for you to review at your convenience later.  YOU SHOULD EXPECT: Some feelings of bloating in the abdomen. Passage of more gas than usual.  Walking can help get rid of the air that was put into your GI tract during the procedure and reduce the bloating. If you had a lower endoscopy (such as a colonoscopy or flexible sigmoidoscopy) you may notice spotting of blood in your stool or on the toilet paper. If you underwent a bowel prep for your procedure, you may not have a normal bowel movement for a few days.  Please Note:  You might notice some irritation and congestion in your nose or some drainage.  This is from the oxygen used during your procedure.  There is no need for concern and it should clear up in a day or so.  SYMPTOMS TO REPORT IMMEDIATELY:   Following lower endoscopy (colonoscopy or flexible sigmoidoscopy):  Excessive amounts of blood in the stool  Significant tenderness or worsening of abdominal pains  Swelling of the abdomen that is new, acute  Fever of 100F or higher  For urgent or emergent issues, a gastroenterologist can be reached at any hour by calling [phone].   DIET: Your first meal following the procedure should be a small meal and then it is ok to progress to your normal diet. Heavy or fried foods are harder to digest and may make you feel nauseous or bloated.  Likewise, meals heavy in dairy and vegetables can  increase bloating.  Drink plenty of fluids but you should avoid alcoholic beverages for 24 hours.  ACTIVITY:  You should plan to take it easy for the rest of today and you should NOT DRIVE or use heavy machinery until tomorrow (because of the sedation medicines used during the test).    FOLLOW UP: Our staff will call the number listed on your records the next business day following your procedure to check on you and address any questions or concerns that you may have regarding the information given to you following your procedure. If we do not reach you, we will leave a message.  However, if you are feeling well and you are not experiencing any problems, there is no need to return our call.  We will assume that you have returned to your regular daily activities without incident.  If any biopsies were taken you will be contacted by phone or by letter within the next 1-3 weeks.  Please call us at [phone] if you have not heard about the biopsies in 3 weeks.    SIGNATURES/CONFIDENTIALITY: You and/or your care partner have signed paperwork which will be entered into your electronic medical record.  These signatures attest to the fact that that the information above on your After Visit Summary has been reviewed and is understood.  Full responsibility of the confidentiality of this discharge information lies with you and/or your care-partner.

## 2015-01-13 NOTE — Progress Notes (Signed)
Patient awakening,vss,report to rn 

## 2015-01-13 NOTE — Progress Notes (Signed)
Called to room to assist during endoscopic procedure.  Patient ID and intended procedure confirmed with present staff. Received instructions for my participation in the procedure from the performing physician.  

## 2015-01-14 ENCOUNTER — Telehealth: Payer: Self-pay | Admitting: *Deleted

## 2015-01-14 NOTE — Telephone Encounter (Signed)
  Follow up Call-  Call back number 01/13/2015  Post procedure Call Back phone  # [phone]  Permission to leave phone message Yes     Patient questions:  Do you have a fever, pain , or abdominal swelling? No. Pain Score  0 *  Have you tolerated food without any problems? Yes.    Have you been able to return to your normal activities? Yes.    Do you have any questions about your discharge instructions: Diet   No. Medications  No. Follow up visit  No.  Do you have questions or concerns about your Care? No.  Actions: * If pain score is 4 or above: No action needed, pain <4.

## 2015-01-18 ENCOUNTER — Telehealth: Payer: Self-pay | Admitting: Internal Medicine

## 2015-01-18 ENCOUNTER — Telehealth: Payer: Self-pay

## 2015-01-18 NOTE — Telephone Encounter (Signed)
Please see my message sent to you a short while ago. Thanks

## 2015-01-18 NOTE — Telephone Encounter (Signed)
See additional note. 

## 2015-01-18 NOTE — Telephone Encounter (Signed)
Pt aware of results 

## 2015-01-18 NOTE — Telephone Encounter (Signed)
-----   Message from Irene Shipper, MD sent at 01/18/2015  3:44 PM EDT ----- Regarding: Rectal cancer plans [name] [name], I contacted the patient to let him know that his rectal polypectomy reveals invasive adenocarcinoma. He needs a contrast CT of the chest, abdomen, and pelvis "rectal cancer rule out metastasis". If negative, shortly thereafter, rectal endoscopic ultrasound with Dr. Ardis Hughs (he is aware). The patient is aware of these plans as well and will expect your call. Also, place recall colonoscopy in 1 year. Convert to phone note for the record. Thank you. Dr.Perry

## 2015-01-18 NOTE — Telephone Encounter (Signed)
Pt calling for path results from Colonoscopy done by Dr. [name] 01/13/15. Please advise.

## 2015-01-19 ENCOUNTER — Other Ambulatory Visit: Payer: Self-pay

## 2015-01-19 ENCOUNTER — Telehealth: Payer: Self-pay

## 2015-01-19 DIAGNOSIS — C20 Malignant neoplasm of rectum: Secondary | ICD-10-CM

## 2015-01-19 NOTE — Telephone Encounter (Signed)
-----   Message from Irene Shipper, MD sent at 01/18/2015  3:44 PM EDT ----- Regarding: Rectal cancer plans [name] [name], I contacted the patient to let him know that his rectal polypectomy reveals invasive adenocarcinoma. He needs a contrast CT of the chest, abdomen, and pelvis "rectal cancer rule out metastasis". If negative, shortly thereafter, rectal endoscopic ultrasound with Dr. Ardis Hughs (he is aware). The patient is aware of these plans as well and will expect your call. Also, place recall colonoscopy in 1 year. Convert to phone note for the record. Thank you. Dr.Perry

## 2015-01-19 NOTE — Telephone Encounter (Signed)
Pt scheduled for CT of CAP at [facility] 01/21/15@1pm . Pt to hold metformin day of scan, be NPO after 9am except bottle 1 of contrast at 11am, bottle 2 at 12noon. Pt states this time will not work for him. Pt given the phone number [phone] to call and reschedule the scan. Recall entered for 1 year.

## 2015-01-21 ENCOUNTER — Inpatient Hospital Stay: Admission: RE | Admit: 2015-01-21 | Payer: Medicare PPO | Source: Ambulatory Visit

## 2015-01-21 ENCOUNTER — Other Ambulatory Visit: Payer: Medicare PPO

## 2015-01-25 ENCOUNTER — Ambulatory Visit (INDEPENDENT_AMBULATORY_CARE_PROVIDER_SITE_OTHER)
Admission: RE | Admit: 2015-01-25 | Discharge: 2015-01-25 | Disposition: A | Discharge status: Home / Self Care | Payer: Medicare PPO | Source: Ambulatory Visit | Attending: Internal Medicine | Admitting: Internal Medicine

## 2015-01-25 DIAGNOSIS — C20 Malignant neoplasm of rectum: Secondary | ICD-10-CM

## 2015-01-25 MED ORDER — IOHEXOL 300 MG/ML  SOLN
100.0000 mL | Freq: Once | INTRAMUSCULAR | Status: AC | PRN
  Administered 2015-01-25: 100 mL via INTRAVENOUS

## 2015-01-27 ENCOUNTER — Telehealth: Payer: Self-pay | Admitting: Internal Medicine

## 2015-01-27 ENCOUNTER — Encounter: Payer: Self-pay | Admitting: Internal Medicine

## 2015-01-27 ENCOUNTER — Other Ambulatory Visit: Payer: Self-pay

## 2015-01-27 DIAGNOSIS — C20 Malignant neoplasm of rectum: Secondary | ICD-10-CM

## 2015-01-27 NOTE — Telephone Encounter (Signed)
Pts wife aware. Reminder entered in epic for MRI in 3 mth. Pts wife would still like Dr. [name] to call them when he returns on Tuesday. They want clarification as to if the cancer was just in the polyp that was removed does he have "rectal cancer" since the CT scan did not show anything outside the polyp that was removed. Want to know if removing the polyp took care of everything or is the EUS being done to see if there is involvement elsewhere.

## 2015-01-27 NOTE — Telephone Encounter (Signed)
Pts wife and pt would like to speak with Dr. [name] regarding pts path/CT results. They want to know if Dr. [name] thinks he needs to have an MRI done to look at liver and if he needs to see Dr. Jola Babinski at [facility]. Would like to discuss by phone with Dr. [name].

## 2015-01-27 NOTE — Telephone Encounter (Signed)
Let wife know liver with tiny benign appearing cysts only. Would get MRI of liver in 3-6 months to assure stability. MRI now would not add anything to recent CT. This has no impact on management of rectal cancer. No need to see Dr. Jola Babinski at this point. I'm out of the office and away until Tuesday

## 2015-02-01 ENCOUNTER — Ambulatory Visit (INDEPENDENT_AMBULATORY_CARE_PROVIDER_SITE_OTHER): Payer: Medicare PPO | Admitting: Internal Medicine

## 2015-02-01 ENCOUNTER — Encounter: Payer: Self-pay | Admitting: Internal Medicine

## 2015-02-01 VITALS — BP 148/80 | HR 70 | Wt 260.0 lb

## 2015-02-01 DIAGNOSIS — E118 Type 2 diabetes mellitus with unspecified complications: Secondary | ICD-10-CM | POA: Diagnosis not present

## 2015-02-01 DIAGNOSIS — K625 Hemorrhage of anus and rectum: Secondary | ICD-10-CM | POA: Diagnosis not present

## 2015-02-01 DIAGNOSIS — I10 Essential (primary) hypertension: Secondary | ICD-10-CM

## 2015-02-01 DIAGNOSIS — I480 Paroxysmal atrial fibrillation: Secondary | ICD-10-CM | POA: Diagnosis not present

## 2015-02-01 DIAGNOSIS — E785 Hyperlipidemia, unspecified: Secondary | ICD-10-CM

## 2015-02-01 NOTE — Assessment & Plan Note (Signed)
On Pravachol 

## 2015-02-01 NOTE — Telephone Encounter (Signed)
I spoke to the wife and answered her questions to her satisfaction. Also, at her request, I contact her husband and reviewed the same discussion. Next step is EUS

## 2015-02-01 NOTE — Assessment & Plan Note (Signed)
Chronic BP ok at home Lopressor, Azor

## 2015-02-01 NOTE — Assessment & Plan Note (Signed)
S/p colonoscopy.

## 2015-02-01 NOTE — Progress Notes (Signed)
Pre visit review using our clinic review tool, if applicable. No additional management support is needed unless otherwise documented below in the visit note. 

## 2015-02-01 NOTE — Assessment & Plan Note (Signed)
Chronic  On Actos/Metformin

## 2015-02-01 NOTE — Assessment & Plan Note (Signed)
Dr [name] EGD pending

## 2015-02-01 NOTE — Assessment & Plan Note (Signed)
Lopressor, Azor 4/16 off anticoag due to GI bleeed

## 2015-02-01 NOTE — Progress Notes (Signed)
Subjective:    HPI  Pt's sister died this wknd of cancer.  F/u low hgb 8.4 last Tue at the [facility] Pt had rectal bleeding prior). Eliquis was stopped by Dr Conley Canal. Colon sch for 5/15 here...  The patient presents for a follow-up of  chronic hypertension, chronic dyslipidemia, type 2 diabetes controlled with medicines. F/u new prostate ca (s/p XRT seeds 2014) and liver cyst (in the process of work up). He is s/p liver mass resection at [facility] - benign 2014. On CPAP for OSA  He is s/p L TKR 10/13 - better. No arrhythmias  BP Readings from Last 3 Encounters:  02/01/15 148/80  01/13/15 153/92  12/21/14 130/56   Wt Readings from Last 3 Encounters:  02/01/15 260 lb (117.935 kg)  01/13/15 262 lb (118.842 kg)  12/21/14 263 lb (119.296 kg)      Review of Systems  Constitutional: Negative for appetite change, fatigue and unexpected weight change.  HENT: Negative for congestion, nosebleeds, sneezing, sore throat and trouble swallowing.   Eyes: Negative for itching and visual disturbance.  Respiratory: Negative for cough.   Cardiovascular: Negative for chest pain, palpitations and leg swelling.  Gastrointestinal: Negative for nausea, diarrhea, blood in stool and abdominal distention.  Genitourinary: Negative for frequency and hematuria.  Musculoskeletal: Positive for joint swelling (Knees, L heel) and gait problem. Negative for back pain and neck pain.  Skin: Negative for rash.  Neurological: Negative for dizziness, tremors, speech difficulty and weakness.  Psychiatric/Behavioral: Negative for sleep disturbance, dysphoric mood and agitation. The patient is not nervous/anxious.        Objective:   Physical Exam  Constitutional: He is oriented to person, place, and time. He appears well-developed. No distress.  NAD  HENT:  Mouth/Throat: Oropharynx is clear and moist.  Eyes: Conjunctivae are normal. Pupils are equal, round, and reactive to light.  Neck: Normal range of motion. No JVD  present. No thyromegaly present.  Cardiovascular: Normal rate, regular rhythm, normal heart sounds and intact distal pulses.  Exam reveals no gallop and no friction rub.   No murmur heard. Pulmonary/Chest: Effort normal and breath sounds normal. No respiratory distress. He has no wheezes. He has no rales. He exhibits no tenderness.  Abdominal: Soft. Bowel sounds are normal. He exhibits no distension and no mass. There is no tenderness. There is no rebound and no guarding.  Musculoskeletal: Normal range of motion. He exhibits edema. He exhibits no tenderness.  Lymphadenopathy:    He has no cervical adenopathy.  Neurological: He is alert and oriented to person, place, and time. He has normal reflexes. No cranial nerve deficit. He exhibits normal muscle tone. He displays a negative Romberg sign. Coordination and gait normal.  No meningeal signs  Skin: Skin is warm and dry. No rash noted.  Psychiatric: He has a normal mood and affect. His behavior is normal. Judgment and thought content normal.  Abd scar is healed - large B edema none to trace  Lab Results  Component Value Date   WBC 5.4 12/21/2014   HGB 8.6 Repeated and verified X2.* 12/21/2014   HCT 27.0* 12/21/2014   PLT 377.0 12/21/2014   GLUCOSE 130* 12/21/2014   CHOL 185 07/13/2014   TRIG 58.0 07/13/2014   HDL 45.50 07/13/2014   LDLDIRECT 125.4 07/14/2008   LDLCALC 128* 07/13/2014   ALT 17 12/21/2014   AST 18 12/21/2014   NA 141 12/21/2014   K 3.9 12/21/2014   CL 107 12/21/2014   CREATININE 1.05 12/21/2014  BUN 16 12/21/2014   CO2 31 12/21/2014   TSH 2.15 09/11/2012   PSA 0.50 03/17/2014   INR 1.2* 12/21/2014   HGBA1C 5.9 12/21/2014   Colonoscopy 01/13/15     Assessment & Plan:

## 2015-02-02 ENCOUNTER — Encounter (HOSPITAL_COMMUNITY): Payer: Self-pay | Admitting: *Deleted

## 2015-02-09 ENCOUNTER — Encounter (HOSPITAL_COMMUNITY): Payer: Self-pay | Admitting: Anesthesiology

## 2015-02-09 NOTE — Anesthesia Preprocedure Evaluation (Addendum)
Anesthesia Evaluation  Patient identified by MRN, date of birth, ID band Patient awake    Reviewed: Allergy & Precautions, NPO status , Patient's Chart, lab work & pertinent test results  Airway Mallampati: II  TM Distance: >3 FB Neck ROM: Full    Dental no notable dental hx.    Pulmonary sleep apnea , COPDformer smoker,  breath sounds clear to auscultation  Pulmonary exam normal       Cardiovascular hypertension, Pt. on medications and Pt. on home beta blockers Normal cardiovascular examRhythm:Regular Rate:Normal     Neuro/Psych PSYCHIATRIC DISORDERS Schizophrenia negative neurological ROS     GI/Hepatic negative GI ROS, Neg liver ROS,   Endo/Other  diabetes, Type 2, Oral Hypoglycemic Agents  Renal/GU negative Renal ROS  negative genitourinary   Musculoskeletal  (+) Arthritis -,   Abdominal (+) + obese,   Peds negative pediatric ROS (+)  Hematology  (+) anemia ,   Anesthesia Other Findings   Reproductive/Obstetrics negative OB ROS                            Anesthesia Physical Anesthesia Plan  ASA: III  Anesthesia Plan:    Post-op Pain Management:    Induction: Intravenous  Airway Management Planned: Natural Airway  Additional Equipment:   Intra-op Plan:   Post-operative Plan:   Informed Consent: I have reviewed the patients History and Physical, chart, labs and discussed the procedure including the risks, benefits and alternatives for the proposed anesthesia with the patient or authorized representative who has indicated his/her understanding and acceptance.   Dental advisory given  Plan Discussed with: CRNA  Anesthesia Plan Comments: (At 0725, I have been informed that Mr. Poirier will not need a MAC today. IV sedation only.)       Anesthesia Quick Evaluation

## 2015-02-10 ENCOUNTER — Encounter (HOSPITAL_COMMUNITY)
Admission: RE | Disposition: A | Discharge status: Home / Self Care | Payer: Self-pay | Source: Ambulatory Visit | Attending: Gastroenterology

## 2015-02-10 ENCOUNTER — Ambulatory Visit (HOSPITAL_COMMUNITY): Payer: Medicare PPO | Admitting: Anesthesiology

## 2015-02-10 ENCOUNTER — Encounter (HOSPITAL_COMMUNITY): Payer: Self-pay

## 2015-02-10 ENCOUNTER — Ambulatory Visit (HOSPITAL_COMMUNITY)
Admission: RE | Admit: 2015-02-10 | Discharge: 2015-02-10 | Disposition: A | Discharge status: Home / Self Care | Payer: Medicare PPO | Source: Ambulatory Visit | Attending: Gastroenterology | Admitting: Gastroenterology

## 2015-02-10 DIAGNOSIS — C20 Malignant neoplasm of rectum: Secondary | ICD-10-CM

## 2015-02-10 DIAGNOSIS — J449 Chronic obstructive pulmonary disease, unspecified: Secondary | ICD-10-CM | POA: Diagnosis not present

## 2015-02-10 DIAGNOSIS — F209 Schizophrenia, unspecified: Secondary | ICD-10-CM | POA: Diagnosis not present

## 2015-02-10 DIAGNOSIS — E119 Type 2 diabetes mellitus without complications: Secondary | ICD-10-CM | POA: Diagnosis not present

## 2015-02-10 DIAGNOSIS — Z85048 Personal history of other malignant neoplasm of rectum, rectosigmoid junction, and anus: Secondary | ICD-10-CM | POA: Insufficient documentation

## 2015-02-10 DIAGNOSIS — Z08 Encounter for follow-up examination after completed treatment for malignant neoplasm: Secondary | ICD-10-CM | POA: Diagnosis present

## 2015-02-10 DIAGNOSIS — I10 Essential (primary) hypertension: Secondary | ICD-10-CM | POA: Diagnosis not present

## 2015-02-10 DIAGNOSIS — G4733 Obstructive sleep apnea (adult) (pediatric): Secondary | ICD-10-CM | POA: Insufficient documentation

## 2015-02-10 DIAGNOSIS — Z8546 Personal history of malignant neoplasm of prostate: Secondary | ICD-10-CM | POA: Insufficient documentation

## 2015-02-10 DIAGNOSIS — E785 Hyperlipidemia, unspecified: Secondary | ICD-10-CM | POA: Diagnosis not present

## 2015-02-10 DIAGNOSIS — Z79899 Other long term (current) drug therapy: Secondary | ICD-10-CM | POA: Diagnosis not present

## 2015-02-10 DIAGNOSIS — M1990 Unspecified osteoarthritis, unspecified site: Secondary | ICD-10-CM | POA: Diagnosis not present

## 2015-02-10 DIAGNOSIS — Z9989 Dependence on other enabling machines and devices: Secondary | ICD-10-CM | POA: Insufficient documentation

## 2015-02-10 DIAGNOSIS — Z87891 Personal history of nicotine dependence: Secondary | ICD-10-CM | POA: Insufficient documentation

## 2015-02-10 DIAGNOSIS — R933 Abnormal findings on diagnostic imaging of other parts of digestive tract: Secondary | ICD-10-CM | POA: Insufficient documentation

## 2015-02-10 DIAGNOSIS — E669 Obesity, unspecified: Secondary | ICD-10-CM | POA: Insufficient documentation

## 2015-02-10 DIAGNOSIS — Z6833 Body mass index (BMI) 33.0-33.9, adult: Secondary | ICD-10-CM | POA: Diagnosis not present

## 2015-02-10 HISTORY — PX: EUS: SHX5427

## 2015-02-10 LAB — GLUCOSE, CAPILLARY: GLUCOSE-CAPILLARY: 112 mg/dL — AB (ref 65–99)

## 2015-02-10 SURGERY — ULTRASOUND, LOWER GI TRACT, ENDOSCOPIC
Anesthesia: Monitor Anesthesia Care

## 2015-02-10 MED ORDER — FENTANYL CITRATE (PF) 100 MCG/2ML IJ SOLN
INTRAMUSCULAR | Status: DC | PRN
  Administered 2015-02-10 (×4): 25 ug via INTRAVENOUS

## 2015-02-10 MED ORDER — LACTATED RINGERS IV SOLN
INTRAVENOUS | Status: DC
  Administered 2015-02-10: 1000 mL via INTRAVENOUS

## 2015-02-10 MED ORDER — SODIUM CHLORIDE 0.9 % IJ SOLN
INTRAMUSCULAR | Status: AC
  Filled 2015-02-10: qty 10

## 2015-02-10 MED ORDER — EPHEDRINE SULFATE 50 MG/ML IJ SOLN
INTRAMUSCULAR | Status: AC
  Filled 2015-02-10: qty 2

## 2015-02-10 MED ORDER — SODIUM CHLORIDE 0.9 % IV SOLN: INTRAVENOUS | Status: DC

## 2015-02-10 MED ORDER — PHENYLEPHRINE 40 MCG/ML (10ML) SYRINGE FOR IV PUSH (FOR BLOOD PRESSURE SUPPORT)
PREFILLED_SYRINGE | INTRAVENOUS | Status: AC
  Filled 2015-02-10: qty 10

## 2015-02-10 MED ORDER — PROPOFOL 10 MG/ML IV BOLUS
INTRAVENOUS | Status: AC
  Filled 2015-02-10: qty 20

## 2015-02-10 MED ORDER — MIDAZOLAM HCL 5 MG/ML IJ SOLN
INTRAMUSCULAR | Status: AC
  Filled 2015-02-10: qty 2

## 2015-02-10 MED ORDER — FENTANYL CITRATE (PF) 100 MCG/2ML IJ SOLN
INTRAMUSCULAR | Status: AC
  Filled 2015-02-10: qty 2

## 2015-02-10 MED ORDER — MIDAZOLAM HCL 10 MG/2ML IJ SOLN
INTRAMUSCULAR | Status: DC | PRN
  Administered 2015-02-10 (×2): 1 mg via INTRAVENOUS
  Administered 2015-02-10 (×4): 2 mg via INTRAVENOUS

## 2015-02-10 MED ORDER — KETAMINE HCL 10 MG/ML IJ SOLN
INTRAMUSCULAR | Status: AC
  Filled 2015-02-10: qty 1

## 2015-02-10 MED ORDER — ONDANSETRON HCL 4 MG/2ML IJ SOLN
INTRAMUSCULAR | Status: AC
  Filled 2015-02-10: qty 6

## 2015-02-10 NOTE — Anesthesia Postprocedure Evaluation (Signed)
Case local only.

## 2015-02-10 NOTE — Interval H&P Note (Signed)
History and Physical Interval Note:  02/10/2015 7:23 AM  [name] [name]  has presented today for surgery, with the diagnosis of rectal cancer  The various methods of treatment have been discussed with the patient and family. After consideration of risks, benefits and other options for treatment, the patient has consented to  Procedure(s): LOWER ENDOSCOPIC ULTRASOUND (EUS) (N/A) as a surgical intervention .  The patient's history has been reviewed, patient examined, no change in status, stable for surgery.  I have reviewed the patient's chart and labs.  Questions were answered to the patient's satisfaction.     Milus Banister

## 2015-02-10 NOTE — Op Note (Signed)
[facility] [place], [address]   ENDOSCOPIC ULTRASOUND PROCEDURE REPORT  PATIENT: [name] [name]  MR#: [id number] BIRTHDATE: 21-Apr-1951  GENDER: male ENDOSCOPIST: Milus Banister, MD REFERRED BY:  Eustace Quail, M.D. PROCEDURE DATE:  02/10/2015 PROCEDURE:   Lower EUS ASA CLASS:      Class II INDICATIONS:   1.  recent rectal cancer in a polyp; removed by colonoscopy Dr.  [name] 3-4 weeks ago; CT showed no clear evidence of metastatic disease. MEDICATIONS: Fentanyl 100 mcg IV and Versed 10 mg IV  DESCRIPTION OF PROCEDURE:   After the risks benefits and alternatives of the procedure were  explained, informed consent was obtained. The patient was then placed in the left, lateral, decubitus postion and IV sedation was administered. Throughout the procedure, the patients blood pressure, pulse and oxygen saturations were monitored continuously.  Under direct visualization, the Pentax Radial EUS [id number]  endoscope was introduced through the anus  and advanced to the sigmoid colon . Water was used as necessary to provide an acoustic interface.  Upon completion of the imaging, water was removed and the patient was sent to the recovery room in satisfactory condition.  Sigmoidoscopic findings (with radial echoendoscope and standard adult colonoscope): 1. The site of recent distal rectal polypectomy was NOT clearly evident.  There was no erythema or obvious scar on antegrade or retrograde evaluation.  EUS findingins: 1. There was very vague thickening of the mucosa/submucosal layers in the area that was described on recent colonoscopy (posterior side of rectum, essentially adjacent to the dentate line). The measured about 15mm across and it was not clearly neoplastic or mass-like. 2. There was no perirectal adenopathy.  ENDOSCOPIC IMPRESSION: There is no clear evidence that neoplastic tissue remains in the distal rectum. Endoscopically the region  of the recent polypectomy was completely normal looking.  By EUS, there was only vague thickening of the mucosa, submucosa layers and this was not overtly mass-like.  RECOMMENDATIONS: Will discuss with Dr. [name]; options include interval flex sigmoidoscopy or anoscopy; referral to surgeon to consider the same.  _______________________________ eSignedMilus Banister, MD 02/10/2015 8:31 AM

## 2015-02-10 NOTE — Discharge Instructions (Signed)

## 2015-02-10 NOTE — H&P (View-Only) (Signed)
Subjective:    HPI  Pt's sister died this wknd of cancer.  F/u low hgb 8.4 last Tue at the [facility] Pt had rectal bleeding prior). Eliquis was stopped by Dr Conley Canal. Colon sch for 5/15 here...  The patient presents for a follow-up of  chronic hypertension, chronic dyslipidemia, type 2 diabetes controlled with medicines. F/u new prostate ca (s/p XRT seeds 2014) and liver cyst (in the process of work up). He is s/p liver mass resection at [facility] - benign 2014. On CPAP for OSA  He is s/p L TKR 10/13 - better. No arrhythmias  BP Readings from Last 3 Encounters:  02/01/15 148/80  01/13/15 153/92  12/21/14 130/56   Wt Readings from Last 3 Encounters:  02/01/15 260 lb (117.935 kg)  01/13/15 262 lb (118.842 kg)  12/21/14 263 lb (119.296 kg)      Review of Systems  Constitutional: Negative for appetite change, fatigue and unexpected weight change.  HENT: Negative for congestion, nosebleeds, sneezing, sore throat and trouble swallowing.   Eyes: Negative for itching and visual disturbance.  Respiratory: Negative for cough.   Cardiovascular: Negative for chest pain, palpitations and leg swelling.  Gastrointestinal: Negative for nausea, diarrhea, blood in stool and abdominal distention.  Genitourinary: Negative for frequency and hematuria.  Musculoskeletal: Positive for joint swelling (Knees, L heel) and gait problem. Negative for back pain and neck pain.  Skin: Negative for rash.  Neurological: Negative for dizziness, tremors, speech difficulty and weakness.  Psychiatric/Behavioral: Negative for sleep disturbance, dysphoric mood and agitation. The patient is not nervous/anxious.        Objective:   Physical Exam  Constitutional: He is oriented to person, place, and time. He appears well-developed. No distress.  NAD  HENT:  Mouth/Throat: Oropharynx is clear and moist.  Eyes: Conjunctivae are normal. Pupils are equal, round, and reactive to light.  Neck: Normal range of motion. No JVD  present. No thyromegaly present.  Cardiovascular: Normal rate, regular rhythm, normal heart sounds and intact distal pulses.  Exam reveals no gallop and no friction rub.   No murmur heard. Pulmonary/Chest: Effort normal and breath sounds normal. No respiratory distress. He has no wheezes. He has no rales. He exhibits no tenderness.  Abdominal: Soft. Bowel sounds are normal. He exhibits no distension and no mass. There is no tenderness. There is no rebound and no guarding.  Musculoskeletal: Normal range of motion. He exhibits edema. He exhibits no tenderness.  Lymphadenopathy:    He has no cervical adenopathy.  Neurological: He is alert and oriented to person, place, and time. He has normal reflexes. No cranial nerve deficit. He exhibits normal muscle tone. He displays a negative Romberg sign. Coordination and gait normal.  No meningeal signs  Skin: Skin is warm and dry. No rash noted.  Psychiatric: He has a normal mood and affect. His behavior is normal. Judgment and thought content normal.  Abd scar is healed - large B edema none to trace  Lab Results  Component Value Date   WBC 5.4 12/21/2014   HGB 8.6 Repeated and verified X2.* 12/21/2014   HCT 27.0* 12/21/2014   PLT 377.0 12/21/2014   GLUCOSE 130* 12/21/2014   CHOL 185 07/13/2014   TRIG 58.0 07/13/2014   HDL 45.50 07/13/2014   LDLDIRECT 125.4 07/14/2008   LDLCALC 128* 07/13/2014   ALT 17 12/21/2014   AST 18 12/21/2014   NA 141 12/21/2014   K 3.9 12/21/2014   CL 107 12/21/2014   CREATININE 1.05 12/21/2014  BUN 16 12/21/2014   CO2 31 12/21/2014   TSH 2.15 09/11/2012   PSA 0.50 03/17/2014   INR 1.2* 12/21/2014   HGBA1C 5.9 12/21/2014   Colonoscopy 01/13/15     Assessment & Plan:

## 2015-02-10 NOTE — Transfer of Care (Signed)
Case done with local only.

## 2015-02-11 ENCOUNTER — Encounter (HOSPITAL_COMMUNITY): Payer: Self-pay | Admitting: Gastroenterology

## 2015-02-25 ENCOUNTER — Telehealth: Payer: Self-pay

## 2015-02-25 NOTE — Telephone Encounter (Signed)
-----   Message from Irene Shipper, MD sent at 02/23/2015  6:37 PM EDT ----- [name] [name], Please see the below note. I contacted the [facility] this evening and discussed the consensus opinion as outlined below. Mr. and [name] are in favor of repeat flexible sigmoidoscopy in 3 months. Please make a note to set him up for flexible sigmoidoscopy with biopsies in the [place] in 3 months. Please convert all of this correspondence to permanent phone note for the record. Thank you Dr. [name] ----- Message -----    From: [name] Banister, MD    Sent: 02/16/2015   8:45 AM      To: Irene Shipper, MD  [name] [name],  We reviewed his case at tumor board this morning.  Pathology review shows tumor at a cautery margin but not sure what that means in setting of piecemeal resection.  Consensus agreed that aggressive surveillance is best plan (repeat flex sig or anoscopy in 3 months or so).  Surgeons Nolberto Hanlon) happy to meet him if you'd like to discuss the above, offer surgery since there was tumor at a cautery margin.  dj

## 2015-02-25 NOTE — Telephone Encounter (Signed)
Recall in epic. 

## 2015-03-15 ENCOUNTER — Other Ambulatory Visit: Payer: Self-pay | Admitting: Urology

## 2015-03-19 ENCOUNTER — Other Ambulatory Visit: Payer: Self-pay | Admitting: Internal Medicine

## 2015-03-28 ENCOUNTER — Telehealth: Payer: Self-pay | Admitting: Internal Medicine

## 2015-03-28 MED ORDER — AZITHROMYCIN 250 MG PO TABS: ORAL_TABLET | ORAL | Status: DC

## 2015-03-28 NOTE — Telephone Encounter (Signed)
Ok Z pac (emailed). OV if not better Thx

## 2015-03-28 NOTE — Telephone Encounter (Signed)
Patient believes he has a sinus infection.  Symptoms are fever, stiffness around nose and left ear pain.  Patient wants something sent to pharmacy.  Patient uses Chief of Staff.  Did inform patient he would need appointment.  Patient did want message sent back first.

## 2015-03-29 NOTE — Telephone Encounter (Signed)
Pt notified. Will schedule OV if no improvement.

## 2015-04-01 ENCOUNTER — Encounter: Payer: Self-pay | Admitting: Internal Medicine

## 2015-04-18 ENCOUNTER — Encounter (HOSPITAL_BASED_OUTPATIENT_CLINIC_OR_DEPARTMENT_OTHER): Payer: Self-pay | Admitting: *Deleted

## 2015-04-18 NOTE — Progress Notes (Addendum)
NPO AFTER MN.  ARRIVE AT 0730.  NEEDS ISTAT 8.  CURRENT EKG IN CHART AND EPIC.  WILL TAKE METOPROLOL, FLOMAX, AND AZOR AM DOS W/ SIPS OF WATER.  WILL DO HIBICLENS SHOWER HS BEFORE AND AM DOS. (PT AND WIFE STATES SOMEONE FROM OFFICE TOLD THEM HIBICLENS.  CALLED AND SPOKE W/ PAM, OR SCHEDULER ,  STATES DR Karsten Ro NOT IN OFFICE THIS WEEK TO ASK SO SHOULD BE OK SINCE PT ALREADY HAS HIBICLENS)

## 2015-04-25 ENCOUNTER — Encounter (HOSPITAL_BASED_OUTPATIENT_CLINIC_OR_DEPARTMENT_OTHER): Payer: Self-pay | Admitting: Anesthesiology

## 2015-04-25 ENCOUNTER — Ambulatory Visit (HOSPITAL_BASED_OUTPATIENT_CLINIC_OR_DEPARTMENT_OTHER): Payer: Medicare PPO | Admitting: Anesthesiology

## 2015-04-25 ENCOUNTER — Encounter (HOSPITAL_COMMUNITY)
Admission: RE | Disposition: A | Discharge status: Home / Self Care | Payer: Self-pay | Source: Ambulatory Visit | Attending: Urology

## 2015-04-25 ENCOUNTER — Ambulatory Visit (HOSPITAL_BASED_OUTPATIENT_CLINIC_OR_DEPARTMENT_OTHER)
Admission: RE | Admit: 2015-04-25 | Discharge: 2015-04-26 | Disposition: A | Discharge status: Home / Self Care | Payer: Medicare PPO | Source: Ambulatory Visit | Attending: Urology | Admitting: Urology

## 2015-04-25 DIAGNOSIS — N528 Other male erectile dysfunction: Secondary | ICD-10-CM | POA: Insufficient documentation

## 2015-04-25 DIAGNOSIS — Z823 Family history of stroke: Secondary | ICD-10-CM | POA: Diagnosis not present

## 2015-04-25 DIAGNOSIS — Z87891 Personal history of nicotine dependence: Secondary | ICD-10-CM | POA: Diagnosis not present

## 2015-04-25 DIAGNOSIS — G4733 Obstructive sleep apnea (adult) (pediatric): Secondary | ICD-10-CM | POA: Insufficient documentation

## 2015-04-25 DIAGNOSIS — Z833 Family history of diabetes mellitus: Secondary | ICD-10-CM | POA: Insufficient documentation

## 2015-04-25 DIAGNOSIS — N529 Male erectile dysfunction, unspecified: Secondary | ICD-10-CM | POA: Diagnosis present

## 2015-04-25 DIAGNOSIS — N5201 Erectile dysfunction due to arterial insufficiency: Secondary | ICD-10-CM

## 2015-04-25 DIAGNOSIS — I10 Essential (primary) hypertension: Secondary | ICD-10-CM | POA: Insufficient documentation

## 2015-04-25 DIAGNOSIS — Z79899 Other long term (current) drug therapy: Secondary | ICD-10-CM | POA: Insufficient documentation

## 2015-04-25 DIAGNOSIS — R312 Other microscopic hematuria: Secondary | ICD-10-CM | POA: Diagnosis not present

## 2015-04-25 DIAGNOSIS — E118 Type 2 diabetes mellitus with unspecified complications: Secondary | ICD-10-CM | POA: Diagnosis not present

## 2015-04-25 DIAGNOSIS — E119 Type 2 diabetes mellitus without complications: Secondary | ICD-10-CM | POA: Insufficient documentation

## 2015-04-25 DIAGNOSIS — J449 Chronic obstructive pulmonary disease, unspecified: Secondary | ICD-10-CM | POA: Diagnosis not present

## 2015-04-25 DIAGNOSIS — Z7982 Long term (current) use of aspirin: Secondary | ICD-10-CM | POA: Insufficient documentation

## 2015-04-25 HISTORY — DX: Chronic obstructive pulmonary disease, unspecified: J44.9

## 2015-04-25 HISTORY — DX: Malignant neoplasm of rectum: C20

## 2015-04-25 HISTORY — DX: Personal history of colonic polyps: Z86.010

## 2015-04-25 HISTORY — PX: PENILE PROSTHESIS IMPLANT: SHX240

## 2015-04-25 HISTORY — DX: Obstructive sleep apnea (adult) (pediatric): G47.33

## 2015-04-25 HISTORY — DX: Personal history of malignant neoplasm of prostate: Z85.46

## 2015-04-25 HISTORY — DX: Presence of spectacles and contact lenses: Z97.3

## 2015-04-25 HISTORY — DX: Other intervertebral disc degeneration, lumbar region without mention of lumbar back pain or lower extremity pain: M51.369

## 2015-04-25 HISTORY — DX: Dependence on other enabling machines and devices: Z99.89

## 2015-04-25 HISTORY — DX: Personal history of colon polyps, unspecified: Z86.0100

## 2015-04-25 HISTORY — DX: Other intervertebral disc degeneration, lumbar region: M51.36

## 2015-04-25 HISTORY — DX: Male erectile dysfunction, unspecified: N52.9

## 2015-04-25 HISTORY — DX: Paroxysmal atrial fibrillation: I48.0

## 2015-04-25 HISTORY — DX: Cyst of kidney, acquired: N28.1

## 2015-04-25 HISTORY — DX: Atrioventricular block, first degree: I44.0

## 2015-04-25 LAB — POCT I-STAT, CHEM 8
BUN: 14 mg/dL (ref 6–20)
Calcium, Ion: 1.23 mmol/L (ref 1.13–1.30)
Chloride: 103 mmol/L (ref 101–111)
Creatinine, Ser: 0.9 mg/dL (ref 0.61–1.24)
Glucose, Bld: 127 mg/dL — ABNORMAL HIGH (ref 65–99)
HCT: 41 % (ref 39.0–52.0)
Hemoglobin: 13.9 g/dL (ref 13.0–17.0)
Potassium: 3.9 mmol/L (ref 3.5–5.1)
Sodium: 144 mmol/L (ref 135–145)
TCO2: 25 mmol/L (ref 0–100)

## 2015-04-25 LAB — GLUCOSE, CAPILLARY
Glucose-Capillary: 138 mg/dL — ABNORMAL HIGH (ref 65–99)
Glucose-Capillary: 141 mg/dL — ABNORMAL HIGH (ref 65–99)

## 2015-04-25 SURGERY — INSERTION, PENILE PROSTHESIS, INFLATABLE
Anesthesia: General | Site: Penis

## 2015-04-25 MED ORDER — DEXTROSE 5 % IV SOLN
5.0000 mg/kg | INTRAVENOUS | Status: AC
  Administered 2015-04-25: 590 mg via INTRAVENOUS
  Filled 2015-04-25: qty 14.75

## 2015-04-25 MED ORDER — HYDROMORPHONE HCL 1 MG/ML IJ SOLN
0.5000 mg | INTRAMUSCULAR | Status: DC | PRN
  Administered 2015-04-25 (×2): 1 mg via INTRAVENOUS
  Filled 2015-04-25 (×3): qty 1

## 2015-04-25 MED ORDER — CEFAZOLIN SODIUM-DEXTROSE 2-3 GM-% IV SOLR
INTRAVENOUS | Status: AC
  Filled 2015-04-25: qty 50

## 2015-04-25 MED ORDER — PHENYLEPHRINE HCL 10 MG/ML IJ SOLN
INTRAMUSCULAR | Status: DC | PRN
  Administered 2015-04-25 (×3): 100 ug via INTRAVENOUS
  Administered 2015-04-25: 200 ug via INTRAVENOUS
  Administered 2015-04-25: 100 ug via INTRAVENOUS

## 2015-04-25 MED ORDER — CEFAZOLIN SODIUM-DEXTROSE 2-3 GM-% IV SOLR
2.0000 g | INTRAVENOUS | Status: AC
  Administered 2015-04-25: 2 g via INTRAVENOUS
  Filled 2015-04-25: qty 50

## 2015-04-25 MED ORDER — MIDAZOLAM HCL 5 MG/5ML IJ SOLN
INTRAMUSCULAR | Status: DC | PRN
  Administered 2015-04-25: 2 mg via INTRAVENOUS

## 2015-04-25 MED ORDER — EPHEDRINE SULFATE 50 MG/ML IJ SOLN
INTRAMUSCULAR | Status: DC | PRN
  Administered 2015-04-25 (×2): 10 mg via INTRAVENOUS
  Administered 2015-04-25: 15 mg via INTRAVENOUS

## 2015-04-25 MED ORDER — SODIUM CHLORIDE 0.9 % IR SOLN
Status: DC | PRN
  Administered 2015-04-25: 1000 mL

## 2015-04-25 MED ORDER — FENTANYL CITRATE (PF) 100 MCG/2ML IJ SOLN
INTRAMUSCULAR | Status: AC
  Filled 2015-04-25: qty 2

## 2015-04-25 MED ORDER — LACTATED RINGERS IV SOLN
INTRAVENOUS | Status: DC
Start: 2015-04-25 — End: 2015-04-25
  Administered 2015-04-25 (×2): via INTRAVENOUS
  Filled 2015-04-25: qty 1000

## 2015-04-25 MED ORDER — FENTANYL CITRATE (PF) 100 MCG/2ML IJ SOLN
INTRAMUSCULAR | Status: AC
  Filled 2015-04-25: qty 4

## 2015-04-25 MED ORDER — MIDAZOLAM HCL 2 MG/2ML IJ SOLN
INTRAMUSCULAR | Status: AC
  Filled 2015-04-25: qty 2

## 2015-04-25 MED ORDER — SODIUM CHLORIDE 0.9 % IJ SOLN
INTRAMUSCULAR | Status: DC | PRN
  Administered 2015-04-25: 800 mL

## 2015-04-25 MED ORDER — ACETAMINOPHEN 10 MG/ML IV SOLN
INTRAVENOUS | Status: DC | PRN
  Administered 2015-04-25: 1000 mg via INTRAVENOUS

## 2015-04-25 MED ORDER — ACETAMINOPHEN 325 MG PO TABS
650.0000 mg | ORAL_TABLET | ORAL | Status: DC | PRN
  Filled 2015-04-25: qty 2

## 2015-04-25 MED ORDER — BACITRACIN-NEOMYCIN-POLYMYXIN OINTMENT TUBE
TOPICAL_OINTMENT | CUTANEOUS | Status: DC | PRN
  Administered 2015-04-25: 1 via TOPICAL

## 2015-04-25 MED ORDER — ONDANSETRON HCL 4 MG/2ML IJ SOLN
INTRAMUSCULAR | Status: DC | PRN
  Administered 2015-04-25: 4 mg via INTRAVENOUS

## 2015-04-25 MED ORDER — CEFAZOLIN SODIUM-DEXTROSE 2-3 GM-% IV SOLR
2.0000 g | Freq: Three times a day (TID) | INTRAVENOUS | Status: DC
  Filled 2015-04-25: qty 50

## 2015-04-25 MED ORDER — SODIUM CHLORIDE 0.9 % IV SOLN
INTRAVENOUS | Status: DC
  Administered 2015-04-25 – 2015-04-26 (×2): via INTRAVENOUS
  Filled 2015-04-25: qty 1000

## 2015-04-25 MED ORDER — ONDANSETRON HCL 4 MG/2ML IJ SOLN
4.0000 mg | Freq: Once | INTRAMUSCULAR | Status: DC | PRN
  Filled 2015-04-25: qty 2

## 2015-04-25 MED ORDER — OXYCODONE HCL 5 MG PO TABS
5.0000 mg | ORAL_TABLET | ORAL | Status: DC | PRN
  Filled 2015-04-25: qty 1

## 2015-04-25 MED ORDER — LIDOCAINE HCL (CARDIAC) 20 MG/ML IV SOLN
INTRAVENOUS | Status: DC | PRN
  Administered 2015-04-25: 100 mg via INTRAVENOUS

## 2015-04-25 MED ORDER — BUPIVACAINE HCL 0.5 % IJ SOLN
INTRAMUSCULAR | Status: DC | PRN
  Administered 2015-04-25: 16 mL

## 2015-04-25 MED ORDER — FENTANYL CITRATE (PF) 100 MCG/2ML IJ SOLN
INTRAMUSCULAR | Status: DC | PRN
  Administered 2015-04-25: 50 ug via INTRAVENOUS
  Administered 2015-04-25: 25 ug via INTRAVENOUS
  Administered 2015-04-25: 50 ug via INTRAVENOUS
  Administered 2015-04-25: 25 ug via INTRAVENOUS

## 2015-04-25 MED ORDER — ONDANSETRON HCL 4 MG/2ML IJ SOLN
4.0000 mg | INTRAMUSCULAR | Status: DC | PRN
  Filled 2015-04-25: qty 2

## 2015-04-25 MED ORDER — PROPOFOL 10 MG/ML IV BOLUS
INTRAVENOUS | Status: DC | PRN
  Administered 2015-04-25: 300 mg via INTRAVENOUS

## 2015-04-25 MED ORDER — FENTANYL CITRATE (PF) 100 MCG/2ML IJ SOLN
25.0000 ug | INTRAMUSCULAR | Status: DC | PRN
  Administered 2015-04-25 (×2): 50 ug via INTRAVENOUS
  Filled 2015-04-25: qty 1

## 2015-04-25 MED ORDER — CEFAZOLIN SODIUM-DEXTROSE 2-3 GM-% IV SOLR
2.0000 g | Freq: Three times a day (TID) | INTRAVENOUS | Status: DC
  Administered 2015-04-25 – 2015-04-26 (×2): 2 g via INTRAVENOUS
  Filled 2015-04-25 (×3): qty 50

## 2015-04-25 SURGICAL SUPPLY — 69 items
APPLICATOR COTTON TIP 6IN STRL (MISCELLANEOUS) IMPLANT
BAG DECANTER FOR FLEXI CONT (MISCELLANEOUS) ×2 IMPLANT
BAG URINE DRAINAGE (UROLOGICAL SUPPLIES) ×2 IMPLANT
BANDAGE CO FLEX L/F 2IN X 5YD (GAUZE/BANDAGES/DRESSINGS) IMPLANT
BENZOIN TINCTURE PRP APPL 2/3 (GAUZE/BANDAGES/DRESSINGS) ×2 IMPLANT
BLADE 15 SAFETY STRL DISP (BLADE) ×2 IMPLANT
BLADE CLIPPER SURG (BLADE) ×2 IMPLANT
BLADE HEX COATED 2.75 (ELECTRODE) ×2 IMPLANT
BLADE SURG 10 STRL SS (BLADE) IMPLANT
BLADE SURG 15 STRL LF DISP TIS (BLADE) ×1 IMPLANT
BLADE SURG 15 STRL SS (BLADE) ×1
BNDG GAUZE ELAST 4 BULKY (GAUZE/BANDAGES/DRESSINGS) ×2 IMPLANT
CANISTER SUCTION 2500CC (MISCELLANEOUS) ×2 IMPLANT
CATH FOLEY 2WAY SLVR  5CC 16FR (CATHETERS) ×1
CATH FOLEY 2WAY SLVR 5CC 16FR (CATHETERS) ×1 IMPLANT
CHLORAPREP W/TINT 26ML (MISCELLANEOUS) ×2 IMPLANT
CLOTH BEACON ORANGE TIMEOUT ST (SAFETY) ×2 IMPLANT
COVER BACK TABLE 60X90IN (DRAPES) ×2 IMPLANT
COVER MAYO STAND STRL (DRAPES) ×4 IMPLANT
DISSECTOR ROUND CHERRY 3/8 STR (MISCELLANEOUS) IMPLANT
DRAPE EXTREMITY T 121X128X90 (DRAPE) ×2 IMPLANT
DRSG TEGADERM 4X4.75 (GAUZE/BANDAGES/DRESSINGS) ×2 IMPLANT
ELECT REM PT RETURN 9FT ADLT (ELECTROSURGICAL) ×2
ELECTRODE REM PT RTRN 9FT ADLT (ELECTROSURGICAL) ×1 IMPLANT
GLOVE BIO SURGEON STRL SZ8 (GLOVE) ×2 IMPLANT
GOWN STRL REUS W/ TWL LRG LVL3 (GOWN DISPOSABLE) ×1 IMPLANT
GOWN STRL REUS W/ TWL XL LVL3 (GOWN DISPOSABLE) ×1 IMPLANT
GOWN STRL REUS W/TWL LRG LVL3 (GOWN DISPOSABLE) ×1
GOWN STRL REUS W/TWL XL LVL3 (GOWN DISPOSABLE) ×1
HOLDER FOLEY CATH W/STRAP (MISCELLANEOUS) ×2 IMPLANT
IV NS 250ML (IV SOLUTION) ×1
IV NS 250ML BAXH (IV SOLUTION) ×1 IMPLANT
KIT TITAN ASSEMBLY (Erectile Restoration) ×1 IMPLANT
KIT TITAN ASSEMBLY STANDARD (Erectile Restoration) ×1 IMPLANT
KIT TITAN ASSEMBLY STD (Erectile Restoration) ×1 IMPLANT
LIQUID BAND (GAUZE/BANDAGES/DRESSINGS) ×2 IMPLANT
NEEDLE HYPO 25X1 1.5 SAFETY (NEEDLE) ×2 IMPLANT
PACK BASIN DAY SURGERY FS (CUSTOM PROCEDURE TRAY) ×2 IMPLANT
PENCIL BUTTON HOLSTER BLD 10FT (ELECTRODE) ×2 IMPLANT
PLUG CATH AND CAP STER (CATHETERS) ×2 IMPLANT
PROS TITAN INFRA 0 ANG 22CM (Urological Implant) ×2 IMPLANT
PROSTHESIS TTN INFR 0 ANG 22CM (Urological Implant) ×1 IMPLANT
RESERVOIR 75CC LOCKOUT BIOFLEX (Erectile Restoration) ×2 IMPLANT
RETRACTOR WILSON SYSTEM (INSTRUMENTS) ×2 IMPLANT
SPONGE GAUZE 4X4 12PLY STER LF (GAUZE/BANDAGES/DRESSINGS) ×2 IMPLANT
SPONGE LAP 18X18 X RAY DECT (DISPOSABLE) IMPLANT
SPONGE LAP 4X18 X RAY DECT (DISPOSABLE) ×6 IMPLANT
STRIP CLOSURE SKIN 1/2X4 (GAUZE/BANDAGES/DRESSINGS) IMPLANT
SUPPORT SCROTAL LG STRP (MISCELLANEOUS) IMPLANT
SURGILUBE 2OZ TUBE FLIPTOP (MISCELLANEOUS) ×2 IMPLANT
SUT CHROMIC 3 0 SH 27 (SUTURE) ×4 IMPLANT
SUT MNCRL AB 4-0 PS2 18 (SUTURE) IMPLANT
SUT PDS AB 2-0 CT2 27 (SUTURE) ×4 IMPLANT
SUT VIC AB 2-0 UR6 27 (SUTURE) ×4 IMPLANT
SUT VIC AB 3-0 SH 27 (SUTURE)
SUT VIC AB 3-0 SH 27X BRD (SUTURE) IMPLANT
SUT VICRYL 0 UR6 27IN ABS (SUTURE) IMPLANT
SUT VICRYL 4-0 PS2 18IN ABS (SUTURE) IMPLANT
SYR 20CC LL (SYRINGE) ×2 IMPLANT
SYR 50ML LL SCALE MARK (SYRINGE) ×4 IMPLANT
SYR BULB IRRIGATION 50ML (SYRINGE) ×2 IMPLANT
SYR CONTROL 10ML LL (SYRINGE) ×2 IMPLANT
SYRINGE 10CC LL (SYRINGE) ×2 IMPLANT
TOWEL OR 17X24 6PK STRL BLUE (TOWEL DISPOSABLE) ×4 IMPLANT
TRAY DSU PREP LF (CUSTOM PROCEDURE TRAY) ×2 IMPLANT
TUBE CONNECTING 12X1/4 (SUCTIONS) ×2 IMPLANT
WATER STERILE IRR 3000ML UROMA (IV SOLUTION) IMPLANT
WATER STERILE IRR 500ML POUR (IV SOLUTION) ×2 IMPLANT
YANKAUER SUCT BULB TIP NO VENT (SUCTIONS) ×2 IMPLANT

## 2015-04-25 NOTE — Transfer of Care (Signed)
Immediate Anesthesia Transfer of Care Note  Patient: [name] [name]  Procedure(s) Performed: Procedure(s): PENILE PROSTHESIS THREE PIECE INFLATABLE( [facility] (N/A)  Patient Location: PACU  Anesthesia Type:General  Level of Consciousness: awake and oriented  Airway & Oxygen Therapy: Patient Spontanous Breathing and Patient connected to nasal cannula oxygen  Post-op Assessment: Report given to RN  Post vital signs: Reviewed and stable  Last Vitals:  Filed Vitals:   04/25/15 0740  BP: 171/84  Pulse: 64  Temp: 36.9 C  Resp: 16    Complications: No apparent anesthesia complications

## 2015-04-25 NOTE — Discharge Instructions (Signed)
Penile prosthesis postoperative instructions ° °Wound: ° °In most cases your incision will have absorbable sutures that will dissolve within the first 10-20 days. Some will fall out even earlier. Expect some redness as the sutures dissolved but this should occur only around the sutures. If there is generalized redness, especially with increasing pain or swelling, let us know. The scrotum and penis will very likely get "black and blue" as the blood in the tissues spread. Sometimes the whole scrotum will turn colors. The black and blue is followed by a yellow and brown color. In time, all the discoloration will go away. In some cases some firm swelling in the area of the testicle and pump may persist for up to 4-6 weeks after the surgery and is considered normal in most cases. ° °Diet: ° °You may return to your normal diet within 24 hours following your surgery. You may note some mild nausea and possibly vomiting the first 6-8 hours following surgery. This is usually due to the side effects of anesthesia, and will disappear quite soon. I would suggest clear liquids and a very light meal the first evening following your surgery. ° °Activity: ° °Your physical activity should be restricted the first 48 hours. During that time you should remain relatively inactive, moving about only when necessary. During the first 7-10 days following surgery he should avoid lifting any heavy objects (anything greater than 15 pounds), and avoid strenuous exercise. If you work, ask us specifically about your restrictions, both for work and home. We will write a note to your employer if needed. ° °You should plan to wear a tight pair of jockey shorts or an athletic supporter for the first 4-5 days, even to sleep. This will keep the scrotum immobilized to some degree and keep the swelling down.The position of your penis will determine what is most comfortable but I strongly urge you to keep the penis in the "up" position (toward your  head). ° °Ice packs should be placed on and off over the scrotum for the first 48 hours. Frozen peas or corn in a ZipLock bag can be frozen, used and re-frozen. Fifteen minutes on and 15 minutes off is a reasonable schedule. The ice is a good pain reliever and keeps the swelling down. ° °Hygiene: ° °You may shower 48 hours after your surgery. Tub bathing should be restricted until the seventh day. ° °Medication: ° °You will be sent home with some type of pain medication. In many cases you will be sent home with a narcotic pain pill (hydrocodone or oxycodone). If the pain is not too bad, you may take either Tylenol (acetaminophen) or Advil (ibuprofen) which contain no narcotic agents, and might be tolerated a little better, with fewer side effects. If the pain medication you are sent home with does not control the pain, you will have to let us know. Some narcotic pain medications cannot be given or refilled by a phone call to a pharmacy. ° °Problems you should report to us: ° °· Fever of 101.0 degrees Fahrenheit or greater. °· Moderate or severe swelling under the skin incision or involving the scrotum. °Drug reaction such as hives, a rash, nausea or vomiting. °

## 2015-04-25 NOTE — H&P (Signed)
[name] [name] is a 64 year old male patient with erectile dysfunction.   History of Present Illness        1 - Microscopic Hematuria - Blood present on UA at [facility] and PCP variably x several. No gross episodes. Prior 20PY smoker. No chemical / dye exposure. No prior eval. CT 10/2012 with bilateral renal cysts. Cysto 10/2012 unremarkable. No gross episodes.    2- Low-Risk Prostate Cancer - Pt with 4 cores bilateral Gleason 6 adenocarcinoma on biopsy 11/2012 on w/u right nodule. Prostate volume 3mL. PSA 2.72.  Recent PSA history  2013 - 1.82  09/2012 - 2.72 / DRE 30gm and RT NODULE --> IMRT / brachy in [facility] 02/2013  08/2013 - PSA 0.98; 11/2013 PSA 0.81; 02/2014 PSA 0.88; 05/2014 PSA 0.42; 08/2014 PSA 0.37  02/2015 - PSA 0.13    3 - Lower Urinary Tract Symptoms - Pt with moderate bother from mix of obstructive and irritative symptoms BEFORE brachy/IMRT. Most bothored by nocturia x 2-3 as well as some urgency w/o incontinence. DRE 09/2012 20gm with nodule. Cysto with bilateral hypertrophy and kissing lobes. Began tamsulosin 10/2012 with good result, presently minimal bother. Does have some LE edema that is progressive throughout the day.    4 - Erectile Dysfunction - Slowly progressive decline in ability to achieve and maintain satisfactory erections. Even with Cialis gets only partial ereciton not suitable for penetration. Had tried other PDE5i with similar result. Libido preserved. Given RX for trimix and had teaching / test dose 02/2014 with good initial result, but still not satisfactory at higher volume. Changed to higher concentration with PGE20 and initially reported excellent result but wanted to switch from injection therapy to a prosthesis as injection therapy was no longer resolving in satisfactory erections.    5 - Bilateral Renal Cysts - Pt wtih Lt > Rt renal cysts. No complex features by contrast CT 10/2012    PMH sig for DM2, OA, OSA/CPAP, ortho surgeries, liver resection for  benign mass.    Past Medical History Problems  1. History of Arthritis 2. History of diabetes mellitus (Z86.39) 3. History of hypercholesterolemia (Z86.39) 4. History of hypertension (Z86.79) 5. History of sleep apnea (Z87.09)  Surgical History Problems  1. History of Arthroscopy Knee Left 2. History of Knee Surgery Right 3. History of Lumbar Vertebral Fusion  Current Meds 1. Aspirin 325 MG Oral Tablet;  Therapy: (Recorded:08Sep2015) to Recorded 2. Azithromycin 250 MG Oral Tablet;  Therapy: [id number] to Recorded 3. Azor 10-40 MG Oral Tablet;  Therapy: [phone] to Recorded 4. Fluticasone Propionate 50 MCG/ACT Nasal Suspension;  Therapy: 22Feb2016 to Recorded 5. Lasix TABS;  Therapy: (Recorded:16Jun2016) to Recorded 6. Meloxicam 15 MG Oral Tablet;  Therapy: [phone] to Recorded 7. Metoprolol Tartrate 50 MG Oral Tablet;  Therapy: [phone] to Recorded 8. Multi Vitamin/Minerals TABS;  Therapy: (Recorded:20Jan2014) to Recorded 9. Pioglitazone HCl-Metformin HCl - 15-850 MG Oral Tablet;  Therapy: [id number] to Recorded 10. Pravastatin Sodium 40 MG Oral Tablet;   Therapy: [id number] to Recorded 11. Tamsulosin HCl - 0.4 MG Oral Capsule; 1 tab daily for urination;   Therapy: [id number] to (Last Rx:24Feb2016)  Requested for: 24Feb2016 Ordered 12. TraMADol HCl - 50 MG Oral Tablet;   Therapy: (Recorded:20Jan2014) to Recorded 13. Trimix (PGE 20, PAP 30, PHE 0.5); Inject 0.2-0.62mL as directed. Disp 70mL;   Therapy: [id number] to (Last Rx:08Dec2015) Ordered  Allergies Medication  1. No Known Drug Allergies  Family History Problems  1. Family history of Death In The Family Father 2. Family history of  Death In The Family Mother 3. Family history of Diabetes Mellitus : Mother 4. Family history of Family Health Status Children ___ Living Sons   1 5. Family history of Gastric Cancer : Father 3. Family history of Transient Ischemic Attack : Mother  Social History Problems  1.  Denied: History of Alcohol Use 2. Caffeine Use   1 3. Former smoker [phone])   quit in 2001 4. Marital History - Currently Married  Review of Systems Genitourinary, constitutional, skin, eye, otolaryngeal, hematologic/lymphatic, cardiovascular, pulmonary, endocrine, musculoskeletal, gastrointestinal, neurological and psychiatric system(s) were reviewed and pertinent findings if present are noted.  Genitourinary: Erectile dysfunction  Vitals Vital Signs  Height: 6 ft 2 in Weight: 258 lb  BMI Calculated: 33.13 BSA Calculated: 2.42 Blood Pressure: 144 / 68 Heart Rate: 69  Physical Exam Constitutional: Well nourished and well developed . No acute distress.   ENT:. The ears and nose are normal in appearance.   Neck: The appearance of the neck is normal and no neck mass is present.   Pulmonary: No respiratory distress and normal respiratory rhythm and effort.   Cardiovascular: Heart rate and rhythm are normal . No peripheral edema.   Abdomen: The abdomen is soft and nontender. No masses are palpated. No CVA tenderness. No hernias are palpable. No hepatosplenomegaly noted.   Genitourinary: Examination of the penis demonstrates no discharge, no masses, no lesions and a normal meatus. The scrotum is without lesions. The right epididymis is palpably normal and non-tender. The left epididymis is palpably normal and non-tender. The right testis is non-tender and without masses. The left testis is non-tender and without masses.   Lymphatics: The femoral and inguinal nodes are not enlarged or tender.   Skin: Normal skin turgor, no visible rash and no visible skin lesions.   Neuro/Psych:. Mood and affect are appropriate.    Assessment  He has tried and failed all forms of therapy for his erectile dysfunction and therefore would like to consider inflatable penile prosthesis. We did discuss semirigid prosthesis briefly but I didn't recommend this. His wife had done a great deal of  research and I therefore have discussed the components of an inflatable penile prosthesis and how they are implanted and the location of those implanted components. We discussed the incision used which would be scrotal in his case and the need for an overnight stay in the hospital. I went over the risks and complications completely and thoroughly with them including the fact that if his prosthesis would become infected it may require explantation. I have discussed the probability of success, the anticipated postoperative course and the time required before he could begin using the device. I showed him a sample of the device and allowed him to operate it and also gave him information from [place] including a DVD. I have answered all of his questions to his satisfaction. He would like to proceed with prosthesis implantation.   Plan  He will be scheduled for a 3 piece inflatable penile prosthesis via a scrotal approach.

## 2015-04-25 NOTE — Anesthesia Preprocedure Evaluation (Addendum)
Anesthesia Evaluation  Patient identified by MRN, date of birth, ID band Patient awake    Reviewed: Allergy & Precautions, NPO status , Patient's Chart, lab work & pertinent test results, reviewed documented beta blocker date and time   Airway Mallampati: III  TM Distance: >3 FB Neck ROM: Full    Dental no notable dental hx. (+) Teeth Intact, Dental Advisory Given   Pulmonary sleep apnea and Continuous Positive Airway Pressure Ventilation , COPDformer smoker,  breath sounds clear to auscultation  Pulmonary exam normal       Cardiovascular METS: 3 - Mets hypertension, Pt. on medications and Pt. on home beta blockers Normal cardiovascular exam+ dysrhythmias Rhythm:Regular Rate:Normal  First degree heart block   Neuro/Psych PSYCHIATRIC DISORDERS Schizophrenia negative neurological ROS     GI/Hepatic negative GI ROS, Neg liver ROS,   Endo/Other  diabetes, Type 2, Oral Hypoglycemic Agents  Renal/GU Renal diseaseBilateral renal cysts     Musculoskeletal  (+) Arthritis -, Osteoarthritis,    Abdominal   Peds  Hematology  (+) Blood dyscrasia, anemia ,   Anesthesia Other Findings Day of surgery medications reviewed with the patient.  Reproductive/Obstetrics                           Anesthesia Physical Anesthesia Plan  ASA: III  Anesthesia Plan: General   Post-op Pain Management:    Induction: Intravenous  Airway Management Planned: LMA  Additional Equipment:   Intra-op Plan:   Post-operative Plan: Extubation in OR  Informed Consent: I have reviewed the patients History and Physical, chart, labs and discussed the procedure including the risks, benefits and alternatives for the proposed anesthesia with the patient or authorized representative who has indicated his/her understanding and acceptance.   Dental advisory given  Plan Discussed with: CRNA  Anesthesia Plan Comments:  (Risks/benefits of general anesthesia discussed with patient including risk of damage to teeth, lips, gum, and tongue, nausea/vomiting, allergic reactions to medications, and the possibility of heart attack, stroke and death.  All patient questions answered.  Patient wishes to proceed.)      Anesthesia Quick Evaluation

## 2015-04-25 NOTE — Anesthesia Postprocedure Evaluation (Signed)
  Anesthesia Post-op Note  Patient: [name] [name]  Procedure(s) Performed: Procedure(s) (LRB): PENILE PROSTHESIS THREE PIECE INFLATABLE( [employer]) SCROTAL APPROACH (N/A)  Patient Location: PACU  Anesthesia Type: General  Level of Consciousness: awake and alert   Airway and Oxygen Therapy: Patient Spontanous Breathing  Post-op Pain: mild  Post-op Assessment: Post-op Vital signs reviewed, Patient's Cardiovascular Status Stable, Respiratory Function Stable, Patent Airway and No signs of Nausea or vomiting  Last Vitals:  Filed Vitals:   04/25/15 1230  BP: 116/65  Pulse: 61  Temp:   Resp: 13    Post-op Vital Signs: stable   Complications: No apparent anesthesia complications

## 2015-04-25 NOTE — Progress Notes (Signed)
Patient ID: [name] [name], male   DOB: [birth year], 64 y.o.   MRN: [id number] Night of surgery note  Subjective: The patient is doing well.  No complaints other than some slight soreness.  Objective: Vital signs in last 24 hours: Temp:  [97.5 F (36.4 C)-98.5 F (36.9 C)] 97.5 F (36.4 C) (08/22 1346) Pulse Rate:  [58-69] 69 (08/22 1346) Resp:  [13-20] 16 (08/22 1346) BP: (116-171)/(58-84) 135/77 mmHg (08/22 1346) SpO2:  [95 %-100 %] 100 % (08/22 1346) Weight:  [119.75 kg (264 lb)-121.655 kg (268 lb 3.2 oz)] 121.655 kg (268 lb 3.2 oz) (08/22 1340)  Intake/Output this shift:    Physical Exam:  General: Alert and oriented. Abdomen: Soft, Nondistended. GU: Phallus appears normal with Foley catheter in place and dressing dry and intact. Foley is draining clear urine.  Lab Results:  Recent Labs  04/25/15 0808  HGB 13.9  HCT 41.0    Assessment/Plan: 1) Continue to monitor 2) Per orders   [name] Fullam C. Karsten Ro, MD  Claybon Jabs 04/25/2015, 8:06 PM

## 2015-04-25 NOTE — Op Note (Signed)
PATIENT:  [name] [name]  PRE-OPERATIVE DIAGNOSIS:  Organic erectile dysfunction  POST-OPERATIVE DIAGNOSIS:  Same  PROCEDURE:  Procedure(s): 3 piece inflatable in all prosthesis ([employer])  SURGEON:  Claybon Jabs  INDICATION: He has had long-standing organic erectile dysfunction and has been treated with, unsuccessfully, with oral agents and intracavernosal injection therapy without success. He has elected to proceed with prosthesis implantation.  ANESTHESIA:  General  EBL:  Minimal  Device: 3 piece Titan: 75 cc reservoir, 20 cm cylinders with a 3 cm and a 2 cm rear-tip extender on the right and a 3 cm, 2 cm and 1 cm rear-tip extenders on the left.  LOCAL MEDICATIONS USED:  Half percent Marcaine  SPECIMEN: None  DISPOSITION OF SPECIMEN:  N/A  Description of procedure: The patient was taken to the major operating room, placed on the table and administered general anesthesia in the supine position. His genitalia was then scrubbed for 10 minutes, painted and then sterilely draped. An official timeout was then performed.  A 16 French Foley catheter was placed in the bladder and the bladder was drained and the catheter was plugged. A midline median raphae scrotal incision was then made and then blunt dissection was used to expose the corpus cavernosum on the right and left sides. This was further cleared of overlying tissue using sharp technique. 2-0 PDS sutures were then placed in the corpus cavernosum to serve as stay sutures and then an incision was then made in the corpus cavernosum first on the left-hand side with the knife and then extended proximally and distally with the curved Mayo scissors. I then dilated the corpus cavernosum with the Hegar dilators starting at 10 and progressing to 14 proximally and distally. I then irrigated the corpus cavernosum with antibiotic solution and measured the distance proximally and distally from the stay suture and was noted to be 12 cm distally  and 14 cm proximally for a total of 26 cm. I then turned my attention to the contralateral corpus cavernosum and placed my stay sutures, made my corporotomy and dilated the corpus cavernosum in an identical fashion. This was measured and also was found to be 12 cm distally and 13 cm proximally for a total of 25 cm. It was irrigated with antibiotic solution as was the scrotum. I then chose an 20 cm cylinder set with rear-tip extenders and these were prepped while I prepared the site for reservoir placement.  I used blunt technique to dissect up to the external inguinal ring on the right-hand side. I swept the spermatic cord laterally and then was able to bluntly dissect with my index finger to the floor of the inguinal ring. I drained the bladder completely with a Foley catheter, pierced the floor of the inguinal canal with a blunt clamp and then developed a space behind the symphysis pubis for the reservoir. I irrigated the space with antibiotic solution and then placed the reservoir in this location. I then filled the reservoir with 75 cc of sterile saline.  Attention was redirected to the corporotomies where the cylinders were then placed by first fixing the suture to the distal aspect of the right cylinder to a straight needle. This was then loaded on the [facility] inserter and passed through the corporotomy and distally. I then advanced the straight needle with the Furlow inserter out through the glans and this was grasped with a hemostat and pulled through the glans and the suture was secured with a hemostat. I then performed an identical maneuver  on the contralateral side. After this was performed I irrigated both corpus cavernosum and inserted the distal portion of the cylinder through the corporotomies and pulled this to the end of the corpora with the suture. The proximal aspect with the rear-tip extenders was then passed through the corporotomy and into the seated position on each side. I then connected  reservoir tubing to a syringe filled with sterile saline and inflated the device. I noted a good straight erection with both cylinders equidistant under the glans and no buckling of the cylinders. I therefore deflated the device and closed the corporotomies with the previously placed 2-0 PDS stay sutures with a single interrupted 2-0 PDS in between. The cylinder was then connected to the pump after excising the excess tubing with appropriate shodded hemostats in place and then I used the supplied connectors to make the connection. I then again cycled the device with the pump and it cycled properly. I deflated the device and pumped it up about three quarters of the way to aid with hemostasis. I irrigated the scrotum once again with antibiotic solution.  I then developed the scrotal pocket in the right hemiscrotum in place the pump in this location. I irrigated the wound one last time with antibiotic irrigation and then closed the deep scrotal tissue over the tubing and pump with running 3-0 chromic suture. A second layer was then closed over this first layer with running 3-0 chromic and then the skin was closed with running 3-0 chromic. I then applied Dermabond to the incision and then wrapped the scrotum and penis with a Curlex. The catheter was connected to closed system drainage and the patient was awakened and taken recovery room in stable and satisfactory condition. He tolerated the procedure well and there were no intraoperative complications. Needle sponge and instrument counts were correct at the end of the operation.  PLAN OF CARE: He will be observed overnight with intravenous antibiotics and pain medication with plan for discharge in the a.m.   PATIENT DISPOSITION:  PACU - hemodynamically stable.

## 2015-04-25 NOTE — Anesthesia Procedure Notes (Signed)
Procedure Name: LMA Insertion Date/Time: 04/25/2015 9:05 AM Performed by: Bethena Roys T Pre-anesthesia Checklist: Patient identified, Emergency Drugs available, Suction available and Patient being monitored Patient Re-evaluated:Patient Re-evaluated prior to inductionOxygen Delivery Method: Circle System Utilized Preoxygenation: Pre-oxygenation with 100% oxygen Intubation Type: IV induction Ventilation: Mask ventilation without difficulty LMA: LMA with gastric port inserted LMA Size: 5.0 Number of attempts: 1 Placement Confirmation: positive ETCO2 Tube secured with: Tape Dental Injury: Teeth and Oropharynx as per pre-operative assessment

## 2015-04-26 ENCOUNTER — Encounter (HOSPITAL_BASED_OUTPATIENT_CLINIC_OR_DEPARTMENT_OTHER): Payer: Self-pay | Admitting: Urology

## 2015-04-26 DIAGNOSIS — N528 Other male erectile dysfunction: Secondary | ICD-10-CM | POA: Diagnosis not present

## 2015-04-26 MED ORDER — CIPROFLOXACIN HCL 500 MG PO TABS: 500.0000 mg | ORAL_TABLET | Freq: Two times a day (BID) | ORAL | Status: DC

## 2015-04-26 MED ORDER — OXYCODONE HCL 10 MG PO TABS: 10.0000 mg | ORAL_TABLET | ORAL | Status: DC | PRN

## 2015-04-26 NOTE — Discharge Summary (Signed)
Physician Discharge Summary  Patient ID: [name] [name] MRN: [id number] DOB/AGE: 1951-02-28 64 y.o.  Admit date: 04/25/2015 Discharge date: 04/26/2015  Admission Diagnoses: Organic erectile dysfunction  Discharge Diagnoses:  Active Problems:   Erectile dysfunction   Discharged Condition: good  Hospital Course: He underwent an inflatable penile prosthesis implantation and was observed overnight. He had minimal discomfort. He received intravenous antibiotics and his catheter has been removed and he is ready for discharge at this time.   Discharge Exam: Blood pressure 152/65, pulse 84, temperature 97.6 F (36.4 C), temperature source Oral, resp. rate 18, height '6\' 2"'  (1.88 m), weight 121.655 kg (268 lb 3.2 oz), SpO2 99 %. General appearance: alert, cooperative and no distress  His dressing is dry and intact. He has no abnormal swelling or ecchymoses.  Disposition: 01-Home or Self Care  Discharge Instructions    Discharge patient    Complete by:  As directed             Medication List    TAKE these medications        amLODipine-olmesartan 10-40 MG per tablet  Commonly known as:  AZOR  Take 1 tablet by mouth daily.     aspirin EC 81 MG tablet  Take 81 mg by mouth daily.     CALCIUM 600 + D PO  Take 1 tablet by mouth every morning.     ciprofloxacin 500 MG tablet  Commonly known as:  CIPRO  Take 1 tablet (500 mg total) by mouth 2 (two) times daily.     fluticasone 50 MCG/ACT nasal spray  Commonly known as:  FLONASE  Place 2 sprays into both nostrils daily.     furosemide 20 MG tablet  Commonly known as:  LASIX  Take 1-2 tablets (20-40 mg total) by mouth daily as needed for edema.     GERITOL COMPLETE PO  Take 1 tablet by mouth every morning. With Iron     ibuprofen 200 MG tablet  Commonly known as:  ADVIL,MOTRIN  Take 600 mg by mouth every 6 (six) hours as needed for headache or moderate pain.     metoprolol 50 MG tablet  Commonly known as:  LOPRESSOR   Take 50 mg by mouth 2 (two) times daily.     Oxycodone HCl 10 MG Tabs  Take 1 tablet (10 mg total) by mouth every 4 (four) hours as needed.     pioglitazone-metformin 15-850 MG per tablet  Commonly known as:  ACTOPLUS MET  Take 1 tablet by mouth 2 (two) times daily with a meal.     pravastatin 40 MG tablet  Commonly known as:  PRAVACHOL  Take 1 tablet (40 mg total) by mouth every evening.     tamsulosin 0.4 MG Caps capsule  Commonly known as:  FLOMAX  Take 0.4 mg by mouth every morning.           Follow-up Information    Follow up with Claybon Jabs, MD On 05/03/2015.   Specialty:  Urology   Why:  For your appiontment at 8:15   Contact information:   [place] [address] [phone]       Signed: Claybon Jabs 04/26/2015, 6:54 AM

## 2015-05-03 DIAGNOSIS — N5201 Erectile dysfunction due to arterial insufficiency: Secondary | ICD-10-CM | POA: Diagnosis not present

## 2015-05-04 ENCOUNTER — Ambulatory Visit: Payer: Medicare PPO | Admitting: Internal Medicine

## 2015-06-02 DIAGNOSIS — N5201 Erectile dysfunction due to arterial insufficiency: Secondary | ICD-10-CM | POA: Diagnosis not present

## 2015-08-10 ENCOUNTER — Encounter: Payer: Self-pay | Admitting: Internal Medicine

## 2015-08-10 ENCOUNTER — Ambulatory Visit (INDEPENDENT_AMBULATORY_CARE_PROVIDER_SITE_OTHER): Payer: Medicare PPO | Admitting: Internal Medicine

## 2015-08-10 ENCOUNTER — Other Ambulatory Visit (INDEPENDENT_AMBULATORY_CARE_PROVIDER_SITE_OTHER): Payer: Medicare PPO

## 2015-08-10 VITALS — BP 150/84 | HR 77 | Temp 98.8°F | Wt 278.0 lb

## 2015-08-10 DIAGNOSIS — D500 Iron deficiency anemia secondary to blood loss (chronic): Secondary | ICD-10-CM | POA: Diagnosis not present

## 2015-08-10 DIAGNOSIS — E118 Type 2 diabetes mellitus with unspecified complications: Secondary | ICD-10-CM

## 2015-08-10 DIAGNOSIS — I10 Essential (primary) hypertension: Secondary | ICD-10-CM | POA: Diagnosis not present

## 2015-08-10 DIAGNOSIS — J3089 Other allergic rhinitis: Secondary | ICD-10-CM

## 2015-08-10 DIAGNOSIS — R5383 Other fatigue: Secondary | ICD-10-CM | POA: Insufficient documentation

## 2015-08-10 LAB — CBC WITH DIFFERENTIAL/PLATELET
BASOS PCT: 0.5 % (ref 0.0–3.0)
Basophils Absolute: 0 10*3/uL (ref 0.0–0.1)
EOS PCT: 1.4 % (ref 0.0–5.0)
Eosinophils Absolute: 0.1 10*3/uL (ref 0.0–0.7)
HCT: 42.1 % (ref 39.0–52.0)
HEMOGLOBIN: 13.5 g/dL (ref 13.0–17.0)
LYMPHS ABS: 2.3 10*3/uL (ref 0.7–4.0)
LYMPHS PCT: 30.4 % (ref 12.0–46.0)
MCHC: 32.1 g/dL (ref 30.0–36.0)
MCV: 85.3 fl (ref 78.0–100.0)
MONO ABS: 0.9 10*3/uL (ref 0.1–1.0)
Monocytes Relative: 11.8 % (ref 3.0–12.0)
Neutro Abs: 4.3 10*3/uL (ref 1.4–7.7)
Neutrophils Relative %: 55.9 % (ref 43.0–77.0)
Platelets: 293 10*3/uL (ref 150.0–400.0)
RBC: 4.94 Mil/uL (ref 4.22–5.81)
RDW: 17.7 % — AB (ref 11.5–15.5)
WBC: 7.7 10*3/uL (ref 4.0–10.5)

## 2015-08-10 LAB — BASIC METABOLIC PANEL
BUN: 13 mg/dL (ref 6–23)
CALCIUM: 9.1 mg/dL (ref 8.4–10.5)
CO2: 28 mEq/L (ref 19–32)
Chloride: 108 mEq/L (ref 96–112)
Creatinine, Ser: 0.93 mg/dL (ref 0.40–1.50)
GFR: 104.57 mL/min (ref >60.00)
GLUCOSE: 72 mg/dL (ref 70–99)
Potassium: 3.9 mEq/L (ref 3.5–5.1)
SODIUM: 144 meq/L (ref 135–145)

## 2015-08-10 LAB — HEPATIC FUNCTION PANEL
ALBUMIN: 3.8 g/dL (ref 3.5–5.2)
ALT: 17 U/L (ref 0–53)
AST: 20 U/L (ref 0–37)
Alkaline Phosphatase: 74 U/L (ref 39–117)
Bilirubin, Direct: 0.2 mg/dL (ref 0.0–0.3)
TOTAL PROTEIN: 7 g/dL (ref 6.0–8.3)
Total Bilirubin: 0.7 mg/dL (ref 0.2–1.2)

## 2015-08-10 LAB — IBC PANEL
Iron: 89 ug/dL (ref 42–165)
SATURATION RATIOS: 21.5 % (ref 20.0–50.0)
Transferrin: 295 mg/dL (ref 212.0–360.0)

## 2015-08-10 LAB — TSH: TSH: 1.91 u[IU]/mL (ref 0.35–4.50)

## 2015-08-10 MED ORDER — FLUTICASONE PROPIONATE 50 MCG/ACT NA SUSP: 1.0000 | Freq: Every day | NASAL | Status: DC

## 2015-08-10 MED ORDER — VITAMIN D 1000 UNITS PO TABS: 1000.0000 [IU] | ORAL_TABLET | Freq: Every day | ORAL | Status: DC

## 2015-08-10 MED ORDER — LORATADINE 10 MG PO TABS: 10.0000 mg | ORAL_TABLET | Freq: Every day | ORAL | Status: DC

## 2015-08-10 MED ORDER — MONTELUKAST SODIUM 10 MG PO TABS: 10.0000 mg | ORAL_TABLET | Freq: Every day | ORAL | Status: DC

## 2015-08-10 NOTE — Progress Notes (Signed)
Pre visit review using our clinic review tool, if applicable. No additional management support is needed unless otherwise documented below in the visit note. 

## 2015-08-10 NOTE — Assessment & Plan Note (Signed)
12/16 ?anemia vs other Labs

## 2015-08-10 NOTE — Assessment & Plan Note (Signed)
BP ok at home Lopressor, Azor

## 2015-08-10 NOTE — Progress Notes (Signed)
Subjective:  Patient ID: [name] [name], male    DOB: 03-Jun-1951  Age: 64 y.o. MRN: [id number]  CC: No chief complaint on file.   HPI [name] [name] presents for HTN, DM, dyslipidemia f/u. C/o sinus conus congestion, drainage - yellow, cough. Pt saw Dr Wilburn Cornelia a long time ago... C/o fatigue  Outpatient Prescriptions Prior to Visit  Medication Sig Dispense Refill  . amLODipine-olmesartan (AZOR) 10-40 MG per tablet Take 1 tablet by mouth daily. (Patient taking differently: Take 1 tablet by mouth every morning. ) 90 tablet 3  . aspirin EC 81 MG tablet Take 81 mg by mouth daily.    . furosemide (LASIX) 20 MG tablet Take 1-2 tablets (20-40 mg total) by mouth daily as needed for edema. 60 tablet 5  . Iron-Vitamins (GERITOL COMPLETE PO) Take 1 tablet by mouth every morning. With Iron    . metoprolol (LOPRESSOR) 50 MG tablet Take 50 mg by mouth 2 (two) times daily.     . Oxycodone HCl 10 MG TABS Take 1 tablet (10 mg total) by mouth every 4 (four) hours as needed. 30 tablet 0  . pioglitazone-metformin (ACTOPLUS MET) 15-850 MG per tablet Take 1 tablet by mouth 2 (two) times daily with a meal. 180 tablet 3  . pravastatin (PRAVACHOL) 40 MG tablet Take 1 tablet (40 mg total) by mouth every evening. 90 tablet 3  . tamsulosin (FLOMAX) 0.4 MG CAPS capsule Take 0.4 mg by mouth every morning.     . Calcium Carb-Cholecalciferol (CALCIUM 600 + D PO) Take 1 tablet by mouth every morning.    . fluticasone (FLONASE) 50 MCG/ACT nasal spray Place 2 sprays into both nostrils daily. (Patient taking differently: Place 2 sprays into both nostrils 2 (two) times daily as needed for allergies. ) 16 g 6  . ibuprofen (ADVIL,MOTRIN) 200 MG tablet Take 600 mg by mouth every 6 (six) hours as needed for headache or moderate pain.    . ciprofloxacin (CIPRO) 500 MG tablet Take 1 tablet (500 mg total) by mouth 2 (two) times daily. (Patient not taking: Reported on 08/10/2015) 20 tablet 0   No facility-administered  medications prior to visit.    ROS Review of Systems  Constitutional: Negative for appetite change, fatigue and unexpected weight change.  HENT: Negative for congestion, nosebleeds, sneezing, sore throat and trouble swallowing.   Eyes: Negative for itching and visual disturbance.  Respiratory: Negative for cough.   Cardiovascular: Negative for chest pain, palpitations and leg swelling.  Gastrointestinal: Negative for nausea, diarrhea, blood in stool and abdominal distention.  Genitourinary: Negative for frequency and hematuria.  Musculoskeletal: Negative for back pain, joint swelling, gait problem and neck pain.  Skin: Negative for rash.  Neurological: Negative for dizziness, tremors, speech difficulty and weakness.  Psychiatric/Behavioral: Negative for sleep disturbance, dysphoric mood and agitation. The patient is not nervous/anxious.     Objective:  BP 150/84 mmHg  Pulse 77  Temp(Src) 98.8 F (37.1 C) (Oral)  Wt 278 lb (126.1 kg)  SpO2 98%  BP Readings from Last 3 Encounters:  08/10/15 150/84  04/26/15 152/65  02/10/15 146/71    Wt Readings from Last 3 Encounters:  08/10/15 278 lb (126.1 kg)  04/25/15 268 lb 3.2 oz (121.655 kg)  02/10/15 258 lb (117.028 kg)    Physical Exam  Constitutional: He is oriented to person, place, and time. He appears well-developed. No distress.  NAD  HENT:  Mouth/Throat: Oropharynx is clear and moist.  Eyes: Conjunctivae are normal. Pupils are  equal, round, and reactive to light.  Neck: Normal range of motion. No JVD present. No thyromegaly present.  Cardiovascular: Normal rate, regular rhythm, normal heart sounds and intact distal pulses.  Exam reveals no gallop and no friction rub.   No murmur heard. Pulmonary/Chest: Effort normal and breath sounds normal. No respiratory distress. He has no wheezes. He has no rales. He exhibits no tenderness.  Abdominal: Soft. Bowel sounds are normal. He exhibits no distension and no mass. There is no  tenderness. There is no rebound and no guarding.  Musculoskeletal: Normal range of motion. He exhibits no edema or tenderness.  Lymphadenopathy:    He has no cervical adenopathy.  Neurological: He is alert and oriented to person, place, and time. He has normal reflexes. No cranial nerve deficit. He exhibits normal muscle tone. He displays a negative Romberg sign. Coordination and gait normal.  Skin: Skin is warm and dry. No rash noted.  Psychiatric: He has a normal mood and affect. His behavior is normal. Judgment and thought content normal.  obese eryth nasal mucosa  Lab Results  Component Value Date   WBC 5.4 12/21/2014   HGB 13.9 04/25/2015   HCT 41.0 04/25/2015   PLT 377.0 12/21/2014   GLUCOSE 127* 04/25/2015   CHOL 185 07/13/2014   TRIG 58.0 07/13/2014   HDL 45.50 07/13/2014   LDLDIRECT 125.4 07/14/2008   LDLCALC 128* 07/13/2014   ALT 17 12/21/2014   AST 18 12/21/2014   NA 144 04/25/2015   K 3.9 04/25/2015   CL 103 04/25/2015   CREATININE 0.90 04/25/2015   BUN 14 04/25/2015   CO2 31 12/21/2014   TSH 2.15 09/11/2012   PSA 0.50 03/17/2014   INR 1.2* 12/21/2014   HGBA1C 5.9 12/21/2014    No results found.  Assessment & Plan:   Diagnoses and all orders for this visit:  Essential hypertension -     CBC with Differential/Platelet; Future -     Basic metabolic panel; Future -     IBC panel; Future -     Hepatic function panel; Future -     TSH; Future  Type 2 diabetes mellitus with complication, without long-term current use of insulin (HCC) -     CBC with Differential/Platelet; Future -     Basic metabolic panel; Future -     IBC panel; Future -     Hepatic function panel; Future -     TSH; Future  Anemia due to chronic blood loss -     CBC with Differential/Platelet; Future -     Basic metabolic panel; Future -     IBC panel; Future -     Hepatic function panel; Future -     TSH; Future  Other allergic rhinitis -     CBC with Differential/Platelet;  Future -     Basic metabolic panel; Future -     IBC panel; Future -     Hepatic function panel; Future -     TSH; Future  Other orders -     fluticasone (FLONASE) 50 MCG/ACT nasal spray; Place 1 spray into both nostrils daily. -     loratadine (CLARITIN) 10 MG tablet; Take 1 tablet (10 mg total) by mouth daily. -     montelukast (SINGULAIR) 10 MG tablet; Take 1 tablet (10 mg total) by mouth daily. -     cholecalciferol (VITAMIN D) 1000 UNITS tablet; Take 1 tablet (1,000 Units total) by mouth daily.  I have discontinued Mr.  Porreca's Calcium Carb-Cholecalciferol (CALCIUM 600 + D PO), ibuprofen, and ciprofloxacin. I have also changed his fluticasone. Additionally, I am having him start on loratadine, montelukast, and cholecalciferol. Lastly, I am having him maintain his tamsulosin, pioglitazone-metformin, furosemide, amLODipine-olmesartan, pravastatin, Iron-Vitamins (GERITOL COMPLETE PO), aspirin EC, metoprolol, and Oxycodone HCl.  Meds ordered this encounter  Medications  . fluticasone (FLONASE) 50 MCG/ACT nasal spray    Sig: Place 1 spray into both nostrils daily.    Dispense:  16 g    Refill:  6  . loratadine (CLARITIN) 10 MG tablet    Sig: Take 1 tablet (10 mg total) by mouth daily.    Dispense:  100 tablet    Refill:  3  . montelukast (SINGULAIR) 10 MG tablet    Sig: Take 1 tablet (10 mg total) by mouth daily.    Dispense:  30 tablet    Refill:  5  . cholecalciferol (VITAMIN D) 1000 UNITS tablet    Sig: Take 1 tablet (1,000 Units total) by mouth daily.    Dispense:  100 tablet    Refill:  3     Follow-up: Return in about 6 weeks (around 09/21/2015) for a follow-up visit.  Walker Kehr, MD

## 2015-08-10 NOTE — Assessment & Plan Note (Signed)
Worse: Claritin, Flonase, Singulair

## 2015-08-10 NOTE — Assessment & Plan Note (Signed)
Labs

## 2015-08-10 NOTE — Assessment & Plan Note (Signed)
On Actos/Metformin 

## 2015-08-15 DIAGNOSIS — C61 Malignant neoplasm of prostate: Secondary | ICD-10-CM | POA: Diagnosis not present

## 2015-08-22 DIAGNOSIS — N281 Cyst of kidney, acquired: Secondary | ICD-10-CM | POA: Diagnosis not present

## 2015-08-22 DIAGNOSIS — C61 Malignant neoplasm of prostate: Secondary | ICD-10-CM | POA: Diagnosis not present

## 2015-08-22 DIAGNOSIS — N5201 Erectile dysfunction due to arterial insufficiency: Secondary | ICD-10-CM | POA: Diagnosis not present

## 2015-09-01 ENCOUNTER — Other Ambulatory Visit: Payer: Self-pay | Admitting: Internal Medicine

## 2015-09-04 HISTORY — PX: TOTAL KNEE ARTHROPLASTY: SHX125

## 2015-10-27 NOTE — Progress Notes (Signed)
HPI The patient has followed up for atrial fib of very short in duration.   He did have a liver surgery and postoperatively had rapid atrial fibrillation. The liver lesion turned out to be benign.  He did have a stress perfusion study with normal ejection fraction. He has had an echocardiogram with some very mild septal hypertrophy.  Since I last saw him he's had no further paroxysmal atrial fibrillation. He was being treated with liquids but because of iron deficiency anemia and some GI bleeding this was stopped. He's been on aspirin.  He does not feel any palpitations, presyncope or syncope. Denies any chest pressure, neck or arm discomfort.   Allergies  Allergen Reactions  . Benazepril Hcl Other (See Comments) and Cough  . Sildenafil Other (See Comments)    Current Outpatient Prescriptions  Medication Sig Dispense Refill  . amLODipine-olmesartan (AZOR) 10-40 MG per tablet Take 1 tablet by mouth daily. (Patient taking differently: Take 1 tablet by mouth every morning. ) 90 tablet 3  . aspirin EC 81 MG tablet Take 81 mg by mouth daily.    . cholecalciferol (VITAMIN D) 1000 UNITS tablet Take 1 tablet (1,000 Units total) by mouth daily. 100 tablet 3  . fluticasone (FLONASE) 50 MCG/ACT nasal spray Place 1 spray into both nostrils daily. 16 g 6  . furosemide (LASIX) 20 MG tablet Take 1-2 tablets (20-40 mg total) by mouth daily as needed for edema. 60 tablet 5  . ibuprofen (ADVIL,MOTRIN) 800 MG tablet Take 800 mg by mouth as needed.    . Iron-Vitamins (GERITOL COMPLETE PO) Take 1 tablet by mouth every morning. With Iron    . metoprolol (LOPRESSOR) 50 MG tablet Take 50 mg by mouth 2 (two) times daily.     . montelukast (SINGULAIR) 10 MG tablet Take 1 tablet (10 mg total) by mouth daily. 30 tablet 5  . pioglitazone-metformin (ACTOPLUS MET) 15-850 MG tablet TAKE 1 TABLET BY MOUTH TWICE A DAY WITH MEAL 180 tablet 3  . pravastatin (PRAVACHOL) 40 MG tablet Take 1 tablet (40 mg total) by mouth every  evening. 90 tablet 3  . tamsulosin (FLOMAX) 0.4 MG CAPS capsule Take 0.4 mg by mouth every morning.      No current facility-administered medications for this visit.    Past Medical History  Diagnosis Date  . Hypertension   . Osteoarthritis   . Diabetes mellitus type II   . Hyperlipidemia   . PAF (paroxysmal atrial fibrillation) [facility])     cardiologist--  dr Ezrael Sam  (episode prior to liver surgery at [facility] 2014 w/ post-op RVR)  . First degree heart block   . OSA on CPAP   . History of colon polyps   . History of prostate cancer current PSA per pt 0.13    dx March 2014---  s/p radiactive prostate seed implants and intraop radiotherapy in [place], [place]  . ED (erectile dysfunction) of organic origin   . Bilateral renal cysts     benign  . DDD (degenerative disc disease), lumbar   . COPD (chronic obstructive pulmonary disease) ([place])      PFT 2010 due to previous 20y h/o smoking  . Wears glasses   . Rectal cancer ([place])     01-25-2015----per pathology --  complete rectal polypectomy-- showed  invasive carcinoma rising in tubular adnemoa high grade hyperplasia--  per dr Ardis Hughs per MRI  no other area seen -- will have colonoscopy done in 3 months    Past Surgical History  Procedure Laterality Date  . Total knee arthroplasty Left 10- 2013  . Vasectomy  1980's w/ gen. anes.  . Eus N/A 02/10/2015    Procedure: LOWER ENDOSCOPIC ULTRASOUND (EUS);  Surgeon: Milus Banister, MD;  Location: [facility];  Service: Endoscopy;  Laterality: N/A;  . Radioactive prostate seed implants  May 2014  in [place], [place]  .  right lobe hepatectomy  06-12-2013    [facility] [place]    benign mass  . Cardiovascular stress test  10-01-2014   dr Lazlo Tunney    normal perfusion study, no ischemia,  normal LV function and wall motion, ef 70%  . Transthoracic echocardiogram  12-26-2012    mild focal basal septal hypertrophy,  grade II diastolic dysfunction,  ef 55-60%,  mild LAE,  trivial MR and TR  . Lumbar fusion  2001    . Knee arthroscopy Bilateral 1990's  . Penile prosthesis implant N/A 04/25/2015    Procedure: PENILE PROSTHESIS THREE PIECE INFLATABLE( COLOPLAST) SCROTAL APPROACH;  Surgeon: Kathie Rhodes, MD;  Location: [facility];  Service: Urology;  Laterality: N/A;    ROS:  As stated in the HPI and negative for all other systems.  PHYSICAL EXAM BP 140/88 mmHg  Pulse 60  Ht '6\' 2"'  (1.88 m)  Wt 276 lb 1 oz (125.221 kg)  BMI 35.43 kg/m2 GENERAL:  Well appearing HEENT:  Pupils equal round and reactive, fundi not visualized, oral mucosa unremarkable NECK:  No jugular venous distention, waveform within normal limits, carotid upstroke brisk and symmetric, no bruits, no thyromegaly LYMPHATICS:  No cervical, inguinal adenopathy LUNGS:  Clear to auscultation bilaterally BACK:  No CVA tenderness CHEST:  Unremarkable HEART:  PMI not displaced or sustained,S1 and S2 within normal limits, no S3, no S4, no clicks, no rubs, no murmurs ABD:  Flat, positive bowel sounds normal in frequency in pitch, no bruits, no rebound, no guarding, no midline pulsatile mass, no hepatomegaly, no splenomegaly,  Large stapled surgical wound EXT:  2 plus pulses throughout,  Mild to moderate bilateral lower extremityedema, no cyanosis no clubbing SKIN:  No rashes no nodules NEURO:  Cranial nerves II through XII grossly intact, motor grossly intact throughout PSYCH:  Cognitively intact, oriented to person place and time  EKG:  Sinus rhythm, rate 61, axis within normal limits, intervals within normal limits, no acute ST-T wave changes.  10/28/2015   ASSESSMENT AND PLAN   ATRIAL FIBRILLATION:  [name] [name] has a CHA2DS2 - VASc score of 3.   However, this was paroxysmal only at the time of liver problems. He's had a contraindication GI bleeding. He will not be on anticoagulation. Of note there is no indication for aspirin from a stroke prevention standpoint. Certainly if she has further atrial fibrillation we  would need to reconsider the use of a DOAC.     EDEMA:  This has resolved  HTN:  The blood pressure is at target. No change in medications is indicated. We will continue with therapeutic lifestyle changes (TLC).  OBESITY:  The patient understands the need to lose weight with diet and exercise.

## 2015-10-28 ENCOUNTER — Encounter: Payer: Self-pay | Admitting: Cardiology

## 2015-10-28 ENCOUNTER — Ambulatory Visit (INDEPENDENT_AMBULATORY_CARE_PROVIDER_SITE_OTHER): Payer: Medicare Other | Admitting: Cardiology

## 2015-10-28 VITALS — BP 140/88 | HR 60 | Ht 74.0 in | Wt 276.1 lb

## 2015-10-28 DIAGNOSIS — I480 Paroxysmal atrial fibrillation: Secondary | ICD-10-CM

## 2015-10-28 NOTE — Patient Instructions (Signed)
Your physician recommends that you schedule a follow-up appointment AS NEEDED.  

## 2015-11-22 ENCOUNTER — Other Ambulatory Visit: Payer: Self-pay | Admitting: Cardiology

## 2015-11-22 NOTE — Telephone Encounter (Signed)
REFILL 

## 2016-01-12 ENCOUNTER — Ambulatory Visit: Payer: Medicare Other | Attending: Adult Reconstructive Orthopaedic Surgery | Admitting: Physical Therapy

## 2016-01-12 DIAGNOSIS — M25661 Stiffness of right knee, not elsewhere classified: Secondary | ICD-10-CM | POA: Insufficient documentation

## 2016-01-12 DIAGNOSIS — M25561 Pain in right knee: Secondary | ICD-10-CM | POA: Diagnosis not present

## 2016-01-12 DIAGNOSIS — R262 Difficulty in walking, not elsewhere classified: Secondary | ICD-10-CM | POA: Diagnosis present

## 2016-01-12 NOTE — Patient Instructions (Addendum)
Cryotherapy Cryotherapy means treatment with cold. Ice or gel packs can be used to reduce both pain and swelling. Ice is the most helpful within the first 24 to 48 hours after an injury or flare-up from overusing a muscle or joint. Sprains, strains, spasms, burning pain, shooting pain, and aches can all be eased with ice. Ice can also be used when recovering from surgery. Ice is effective, has very few side effects, and is safe for most people to use. PRECAUTIONS  Ice is not a safe treatment option for people with:  Raynaud phenomenon. This is a condition affecting small blood vessels in the extremities. Exposure to cold may cause your problems to return.  Cold hypersensitivity. There are many forms of cold hypersensitivity, including:  Cold urticaria. Red, itchy hives appear on the skin when the tissues begin to warm after being iced.  Cold erythema. This is a red, itchy rash caused by exposure to cold.  Cold hemoglobinuria. Red blood cells break down when the tissues begin to warm after being iced. The hemoglobin that carry oxygen are passed into the urine because they cannot combine with blood proteins fast enough.  Numbness or altered sensitivity in the area being iced. If you have any of the following conditions, do not use ice until you have discussed cryotherapy with your caregiver:  Heart conditions, such as arrhythmia, angina, or chronic heart disease.  High blood pressure.  Healing wounds or open skin in the area being iced.  Current infections.  Rheumatoid arthritis.  Poor circulation.  Diabetes. Ice slows the blood flow in the region it is applied. This is beneficial when trying to stop inflamed tissues from spreading irritating chemicals to surrounding tissues. However, if you expose your skin to cold temperatures for too long or without the proper protection, you can damage your skin or nerves. Watch for signs of skin damage due to cold. HOME CARE  INSTRUCTIONS Follow these tips to use ice and cold packs safely.  Place a dry or damp towel between the ice and skin. A damp towel will cool the skin more quickly, so you may need to shorten the time that the ice is used.  For a more rapid response, add gentle compression to the ice.  Ice for no more than 10 to 20 minutes at a time. The bonier the area you are icing, the less time it will take to get the benefits of ice.  Check your skin after 5 minutes to make sure there are no signs of a poor response to cold or skin damage.  Rest 20 minutes or more between uses.  Once your skin is numb, you can end your treatment. You can test numbness by very lightly touching your skin. The touch should be so light that you do not see the skin dimple from the pressure of your fingertip. When using ice, most people will feel these normal sensations in this order: cold, burning, aching, and numbness.  Do not use ice on someone who cannot communicate their responses to pain, such as small children or people with dementia. HOW TO MAKE AN ICE PACK Ice packs are the most common way to use ice therapy. Other methods include ice massage, ice baths, and cryosprays. Muscle creams that cause a cold, tingly feeling do not offer the same benefits that ice offers and should not be used as a substitute unless recommended by your caregiver. To make an ice pack, do one of the following:  Place  crushed ice or a bag of frozen vegetables in a sealable plastic bag. Squeeze out the excess air. Place this bag inside another plastic bag. Slide the bag into a pillowcase or place a damp towel between your skin and the bag.  Mix 3 parts water with 1 part rubbing alcohol. Freeze the mixture in a sealable plastic bag. When you remove the mixture from the freezer, it will be slushy. Squeeze out the excess air. Place this bag inside another plastic bag. Slide the bag into a pillowcase or place a damp towel between your skin and the  bag. SEEK MEDICAL CARE IF:  You develop white spots on your skin. This may give the skin a blotchy (mottled) appearance.  Your skin turns blue or pale.  Your skin becomes waxy or hard.  Your swelling gets worse. MAKE SURE YOU:   Understand these instructions.  Will watch your condition.  Will get help right away if you are not doing well or get worse. Document Released: 04/16/2011 Document Revised: 01/04/2014 Document Reviewed: 04/16/2011 [facility] Patient Information 2015 [place], [place]. This information is not intended to replace advice given to you by your health care provider. Make sure you discuss any questions you have with your health care provider.  THe above I want you to be able to do before surgery because you will be doing them afterward.  Knee High   Holding stable object, raise knee to hip level, then lower knee. Repeat with other knee. Complete __10_ repetitions. Do __2__ sessions per day.  ABDUCTION: Standing (Active)   Stand, feet flat. Lift right leg out to side. Use _0__ lbs. Complete __10_ repetitions. Perform __2_ sessions per day.  ADDUCTION: Standing - Stable (Active)   Stand, right leg out to side as far as possible. Draw leg in across midline. Use _0__ lbs. Complete 10_ repetitions. Perform _2__ sessions per day.       EXTENSION: Standing (Active)  Stand, both feet flat. Draw right leg behind body as far as possible. Use 0___ lbs. Complete 10 repetitions. Perform __2_ sessions per day.  Copyright  VHI. All rights reserved.   SEATED Gastroc / Heel Cord Stretch - Seated With Towel   Sit on floor, towel around ball of foot. Gently pull foot in toward body, stretching heel cord and calf. Hold for _30__ seconds. Repeat on involved leg. Repeat __3_ times. Do _3__ times per day.    Achilles / Gastroc, Standing   Stand, right foot behind, heel on floor and turned slightly out, leg straight, forward leg bent. Move hips forward. Hold  _30__ seconds. Repeat _3__ times per session. Do _3__ sessions per day.   Gastroc / Heel Cord Stretch - On Step   Stand with heels over edge of stair. Holding rail, lower heels until stretch is felt in calf of legs. Hold 30 secs.  Repeat __3_ times. Do __3_ times per day.  Copyright  VHI. All rights reserved.     May add weights up to 15 pounds  In 3 lb then 5 lb ,  2/12 lb increments up to 15 pounds     Hip Flexor Stretch   Lying on back near edge of bed, bend one leg, foot flat. Hang other leg over edge, relaxed, thigh resting entirely on bed  And try to bend knee without arching back for _1-3_ minutes. Repeat __1-2__ times. Do __1-2__ sessions per day.  You can more difficult by bending left knee to chest. Advanced Exercise: Bend knee back keeping  thigh in contact with bed.    Voncille Lo, PT 01/12/2016 10:07 AM Phone: [phone] Fax: [phone]

## 2016-01-12 NOTE — Therapy (Signed)
[place], [place], [address] Phone: [phone]   Fax:  [phone]  Physical Therapy Evaluation  Patient Details  Name: [name] [name] MRN: [id number] Date of Birth: 30-Dec-1950 Referring Provider: Arcola Jansky MD  Encounter Date: 01/12/2016      PT End of Session - 01/12/16 0925    Visit Number 1   Number of Visits 18   Date for PT Re-Evaluation 03/08/16   Authorization Type VA   Authorization Time Period 03-08-16   PT Start Time 0925   PT Stop Time 1014   PT Time Calculation (min) 49 min   Activity Tolerance Patient tolerated treatment well   Behavior During Therapy [facility] for tasks assessed/performed      Past Medical History  Diagnosis Date  . Hypertension   . Osteoarthritis   . Diabetes mellitus type II   . Hyperlipidemia   . PAF (paroxysmal atrial fibrillation) [facility])     cardiologist--  dr hochrein  (episode prior to liver surgery at [facility] 2014 w/ post-op RVR)  . First degree heart block   . OSA on CPAP   . History of colon polyps   . History of prostate cancer current PSA per pt 0.13    dx March 2014---  s/p radiactive prostate seed implants and intraop radiotherapy in [place], [place]  . ED (erectile dysfunction) of organic origin   . Bilateral renal cysts     benign  . DDD (degenerative disc disease), lumbar   . COPD (chronic obstructive pulmonary disease) ([place])      PFT 2010 due to previous 20y h/o smoking  . Wears glasses   . Rectal cancer ([place])     01-25-2015----per pathology --  complete rectal polypectomy-- showed  invasive carcinoma rising in tubular adnemoa high grade hyperplasia--  per dr Ardis Hughs per MRI  no other area seen -- will have colonoscopy done in 3 months    Past Surgical History  Procedure Laterality Date  . Total knee arthroplasty Left 10- 2013  . Vasectomy  1980's w/ gen. anes.  . Eus N/A 02/10/2015    Procedure: LOWER ENDOSCOPIC ULTRASOUND (EUS);   Surgeon: Milus Banister, MD;  Location: [facility];  Service: Endoscopy;  Laterality: N/A;  . Radioactive prostate seed implants  May 2014  in [place], [place]  .  right lobe hepatectomy  06-12-2013    [facility] [place]    benign mass  . Cardiovascular stress test  10-01-2014   dr hochrein    normal perfusion study, no ischemia,  normal LV function and wall motion, ef 70%  . Transthoracic echocardiogram  12-26-2012    mild focal basal septal hypertrophy,  grade II diastolic dysfunction,  ef 55-60%,  mild LAE,  trivial MR and TR  . Lumbar fusion  2001  . Knee arthroscopy Bilateral 1990's  . Penile prosthesis implant N/A 04/25/2015    Procedure: PENILE PROSTHESIS THREE PIECE INFLATABLE( COLOPLAST) SCROTAL APPROACH;  Surgeon: Kathie Rhodes, MD;  Location: [facility];  Service: Urology;  Laterality: N/A;    There were no vitals filed for this visit.       Subjective Assessment - 01/12/16 0937    Subjective I am having pain in my right knee and getting worse.  I will have TKR on right on June 20th, 2017 at [facility] by Dr. Rosanne Ashing. Itried cortison shot in February and it didnt work    Pertinent History Left TKR October 2013, DM2,  Prostate CA remission   Limitations Sitting;Standing   How long can you sit comfortably? 1 hour   How long can you stand comfortably? 30 min   How long can you walk comfortably? 53min   Diagnostic tests xray, MRI   Patient Stated Goals Get surgery for TKR, I want to be able to walk 4-5 miles a day for exericise.  I was up to 3 miles before knee pain   Currently in Pain? Yes   Pain Score 8    Pain Location Knee   Pain Orientation Right   Pain Descriptors / Indicators Aching;Shooting   Pain Type Chronic pain   Pain Onset More than a month ago   Pain Frequency Constant   Aggravating Factors  getting up and down steps and getting up and down from car   Pain Relieving Factors mostly nothing             [facility] PT Assessment - 01/12/16 0942     Assessment   Medical Diagnosis Right knee OA    Referring Provider Arcola Jansky MD   Onset Date/Surgical Date 02/21/16  will have R TKR on February 21, 2016   Hand Dominance Right   Prior Therapy not for R TKR but has had left   Precautions   Precautions None   Restrictions   Weight Bearing Restrictions No   Balance Screen   Has the patient fallen in the past 6 months No   Has the patient had a decrease in activity level because of a fear of falling?  No   Is the patient reluctant to leave their home because of a fear of falling?  No   Home Environment   Living Environment Private residence   Type of [place] to enter   Entrance Stairs-Number of Steps 0   Home Layout Two level  basement   Prior Function   Level of [place] Retired   Leisure wants to get back to exericise and yard work wants to be able to use treadmill and elliptical in the basement   Cognition   Overall Cognitive Status Within Functional Limits for tasks assessed   Observation/Other Assessments   Focus on Therapeutic Outcomes (FOTO)  intake62%,  limitation 38% predicted 34%   Posture/Postural Control   Posture/Postural Control Postural limitations   Postural Limitations Forward head;Rounded Shoulders;Anterior pelvic tilt   Posture Comments valgus right knee   AROM   Right Knee Extension 8   Right Knee Flexion 102   Left Knee Extension 3   Left Knee Flexion 110   PROM   Right Knee Extension 5   Right Knee Flexion 110  prone 110   Left Knee Extension 0   Left Knee Flexion 115  Prone 103   Strength   Right Hip Flexion 4-/5   Right Hip Extension 4-/5   Right Hip ABduction 4-/5   Left Hip Flexion 4+/5   Left Hip Extension 4-/5   Left Hip ABduction 4-/5   Right Knee Flexion 4/5   Right Knee Extension 4/5   Left Knee Flexion 4/5   Left Knee Extension 4/5   Flexibility   Hamstrings 70  WNL   Thomas Test    Findings Positive   Side Right    Comments tight hip flexor   Ober's Test   Findings Positive   Side Right  [place] [facility] PT Treatment/Exercise - 01/12/16 0942    Knee/Hip Exercises: Stretches   Active Hamstring Stretch 2 reps;30 seconds  right   Hip Flexor Stretch 2 reps;30 seconds;Right   Gastroc Stretch 2 reps;30 seconds   Other Knee/Hip Stretches $ way standing SLR with added weight progress, 5 reps    Other Knee/Hip Stretches DF towel stretch  and on steps gastroc stretch   Knee/Hip Exercises: Supine   Quad Sets Strengthening;Both;10 reps  use of towel   Straight Leg Raises Right;1 set;Strengthening   Straight Leg Raises Limitations Pt may add progressive weight until 15 pounds before surgery   Other Supine Knee/Hip Exercises sit to stand holding onto counter x 10                PT Education - 01/12/16 1014    Education provided Yes   Education Details POC, Explanation of findings.  PRE OP exericises for TKA and given TKR Phase 1 to be reviewed after surgery   Person(s) Educated Patient   Methods Explanation;Demonstration;Tactile cues;Verbal cues;Handout   Comprehension Verbalized understanding;Returned demonstration          PT Short Term Goals - 01/12/16 0925    PT SHORT TERM GOAL #1   Title Given  initial HEP for pre op and post op exercise right TKR   Time 1   Period Days   Status Achieved   PT SHORT TERM GOAL #2   Title  verbalize understanding of condition management including RICE, positioning, , HEP. for pre op information   Time 1   Period Days   Status Achieved           PT Long Term Goals - 01/12/16 0926    PT LONG TERM GOAL #1   Title Long term goals to be assessed post surgery on February 21, 2016 with re evaluation   PT LONG TERM GOAL #2   Title --   PT LONG TERM GOAL #3   Title --   PT LONG TERM GOAL #4   Title --   PT LONG TERM GOAL #5   Title --               Plan - 01/12/16 1014    Clinical Impression Statement 65 yo pt with  former  left TKR and obesity, DM2,  cardio megaly , prostate cancer and multiple comorbidities. presents today with primary OA or right knee and is scheduled to undergo R TKA on February 18, 2016.  Pt  has been former patient. and was evaluated  before surgery with  decreased ROM, decreased strength and flexibitly and  difficulty walking and negotiating steps.  Pt recieved pre OP exercise today and reviewed RICE.    Rehab Potential Good   PT Frequency 2x / week  2-3 x a week depending on surgery and return   PT Duration 8 weeks   PT Treatment/Interventions ADLs/Self Care Home Management;Cryotherapy;Electrical Stimulation;Iontophoresis 4mg /ml Dexamethasone;Ultrasound;Functional mobility training;Gait training;Stair training;Neuromuscular re-education;Passive range of motion;Taping;Vasopneumatic Device;Scar mobilization;Manual techniques;Patient/family education;Therapeutic exercise   PT Next Visit Plan Re evaluate post R TKR surgery   PT Home Exercise Plan PRe op exericises and Phase 1 TKR exericses   Consulted and Agree with Plan of Care Patient      Patient will benefit from skilled therapeutic intervention in order to improve the following deficits and impairments:  Abnormal gait, Decreased activity tolerance, Obesity, Postural dysfunction, Improper body mechanics, Difficulty walking, Decreased strength, Increased fascial restricitons, Decreased mobility, Decreased range of motion  Visit Diagnosis: Pain in right knee  Stiffness of right knee, not elsewhere classified  Difficulty in walking, not elsewhere classified      G-Codes - Feb 11, 2016 1000    Functional Assessment Tool Used FOTO   Functional Limitation Mobility: Walking and moving around   Mobility: Walking and Moving Around Current Status [phone]) At least 40 percent but less than 60 percent impaired, limited or restricted   Mobility: Walking and Moving Around Goal Status [phone]) At least 20 percent but less than 40 percent impaired,  limited or restricted       Problem List Patient Active Problem List   Diagnosis Date Noted  . Fatigue 08/10/2015  . Erectile dysfunction 04/25/2015  . Anemia due to chronic blood loss 12/21/2014  . GI bleed 12/21/2014  . Chest pain at rest 09/30/2014  . Edema 07/13/2014  . Allergic rhinitis 03/17/2014  . PAF (paroxysmal atrial fibrillation) ([place]) 05/25/2013  . Cardiomegaly 12/12/2012  . Liver mass 12/12/2012  . Prostate cancer ([place]) 12/11/2012  . Hepatic cyst 12/11/2012  . Hematuria, gross 09/05/2012  . Knee osteoarthritis 09/05/2012  . OSA on CPAP 09/05/2012  . Snoring 09/07/2011  . Paresthesia of left arm 07/06/2011  . Gout 03/13/2011  . Well adult exam 03/03/2011  . Arthritis of knee 03/03/2011  . MENTAL CONFUSION 03/09/2010  . GAIT DISTURBANCE 03/09/2010  . HEAT STROKE 03/09/2010  . FOOT PAIN 11/08/2009  . CHEST PAIN UNSPECIFIED 08/19/2009  . COPD 07/08/2009  . TOBACCO USE, QUIT 07/08/2009  . RECTAL BLEEDING 12/07/2008  . PERSONAL HISTORY OF COLONIC POLYPS 12/07/2008  . Dyslipidemia 03/02/2008  . HEMATOCHEZIA 03/02/2008  . COUGH 03/02/2008  . HYPOKALEMIA 02/03/2008  . Acute sinusitis 02/03/2008  . SYNCOPE 02/03/2008  . DM (diabetes mellitus), type 2 with complications ([place]) [id number]  . Pain in joint, shoulder region 11/27/2007  . ERECTILE DYSFUNCTION 10/30/2007  . Essential hypertension 10/30/2007  . OSTEOARTHRITIS 10/30/2007  . LOW BACK PAIN 10/30/2007   Voncille Lo, PT 02-11-16 2:44 PM Phone: [phone] Fax: [facility] [address] [place], [place], [address] Phone: [phone]   Fax:  [phone]  Name: [name] [name] MRN: [id number] Date of Birth: [birth year]

## 2016-01-17 ENCOUNTER — Encounter: Payer: Self-pay | Admitting: Internal Medicine

## 2016-02-01 ENCOUNTER — Ambulatory Visit (AMBULATORY_SURGERY_CENTER): Payer: Self-pay | Admitting: *Deleted

## 2016-02-01 VITALS — Ht 74.0 in | Wt 279.6 lb

## 2016-02-01 DIAGNOSIS — Z85038 Personal history of other malignant neoplasm of large intestine: Secondary | ICD-10-CM

## 2016-02-01 MED ORDER — NA SULFATE-K SULFATE-MG SULF 17.5-3.13-1.6 GM/177ML PO SOLN: 1.0000 | Freq: Once | ORAL | Status: DC

## 2016-02-01 NOTE — Progress Notes (Signed)
No egg or soy allergy known to patient  No issues with past sedation with any surgeries  or procedures, no intubation problems  No diet pills per patient No home 02 use per patient  No blood thinners per patient  Pt denies issues with constipation   

## 2016-02-06 ENCOUNTER — Encounter: Payer: Medicare Other | Admitting: Internal Medicine

## 2016-02-15 ENCOUNTER — Encounter: Payer: Self-pay | Admitting: Internal Medicine

## 2016-02-15 ENCOUNTER — Ambulatory Visit (AMBULATORY_SURGERY_CENTER): Payer: Medicare Other | Admitting: Internal Medicine

## 2016-02-15 VITALS — BP 125/78 | HR 68 | Temp 97.3°F | Resp 17 | Ht 74.0 in | Wt 279.0 lb

## 2016-02-15 DIAGNOSIS — D124 Benign neoplasm of descending colon: Secondary | ICD-10-CM

## 2016-02-15 DIAGNOSIS — Z85038 Personal history of other malignant neoplasm of large intestine: Secondary | ICD-10-CM

## 2016-02-15 DIAGNOSIS — D128 Benign neoplasm of rectum: Secondary | ICD-10-CM | POA: Diagnosis not present

## 2016-02-15 LAB — GLUCOSE, CAPILLARY
Glucose-Capillary: 116 mg/dL — ABNORMAL HIGH (ref 65–99)
Glucose-Capillary: 120 mg/dL — ABNORMAL HIGH (ref 65–99)

## 2016-02-15 MED ORDER — SODIUM CHLORIDE 0.9 % IV SOLN: 500.0000 mL | INTRAVENOUS | Status: DC

## 2016-02-15 NOTE — Progress Notes (Signed)
Report to PACU, RN, vss, BBS= Clear.  

## 2016-02-15 NOTE — Op Note (Signed)
[facility] Patient Name: [name] [name] Procedure Date: 02/15/2016 8:42 AM MRN: [id number] Endoscopist: Docia Chuck. [name] , MD Age: 65 Referring MD:  Date of Birth: Apr 29, 1951 Gender: Male Procedure:                Colonoscopy, with cold snare polypectomy x 1 and                            cold biopsies Indications:              High risk colon cancer surveillance: Personal                            history of adenoma (10 mm or greater in size), High                            risk colon cancer surveillance: Personal history of                            adenoma with villous component, High risk colon                            cancer surveillance: Personal history of multiple                            (3 or more) adenomas, High risk colon cancer                            surveillance: Personal history of colon cancer Medicines:                Monitored Anesthesia Care Procedure:                Pre-Anesthesia Assessment:                           - Prior to the procedure, a History and Physical                            was performed, and patient medications and                            allergies were reviewed. The patient's tolerance of                            previous anesthesia was also reviewed. The risks                            and benefits of the procedure and the sedation                            options and risks were discussed with the patient.                            All questions were answered, and informed consent  was obtained. Prior Anticoagulants: The patient has                            taken no previous anticoagulant or antiplatelet                            agents. ASA Grade Assessment: II - A patient with                            mild systemic disease. After reviewing the risks                            and benefits, the patient was deemed in                            satisfactory condition to undergo the  procedure.                           After obtaining informed consent, the colonoscope                            was passed under direct vision. Throughout the                            procedure, the patient's blood pressure, pulse, and                            oxygen saturations were monitored continuously. The                            Model CF-HQ190L [id number]) scope was introduced                            through the anus and advanced to the the ileocecal                            valve(the cecum could not be deeply intubated                            secondary to redundant colon and body habitus. Seen                            well last year). The ileocecal valve and the rectum                            were photographed. The quality of the bowel                            preparation was excellent. The colonoscopy was                            performed without difficulty. The patient tolerated  the procedure well. The bowel preparation used was                            SUPREP. Scope In: 9:05:19 AM Scope Out: 9:35:02 AM Scope Withdrawal Time: 0 hours 14 minutes 18 seconds  Total Procedure Duration: 0 hours 29 minutes 43 seconds  Findings:                 A 3 mm polyp was found in the descending colon. The                            polyp was removed with a cold snare. Resection and                            retrieval were complete.                           Normal mucosa was found in the rectum. There was                            scarring and slight hypervascularity at previous                            polypectomy site(posterior, starting at anal                            verge). Multiple Biopsies were taken with a cold                            forceps for histologyfrom this area.                           The exam was otherwise without abnormality on                            direct and retroflexion views. Complications:             No immediate complications. Estimated blood loss:                            None. Estimated Blood Loss:     Estimated blood loss: none. Impression:               - One 3 mm polyp in the descending colon, removed                            with a cold snare. Resected and retrieved.                           - The examination was otherwise normal on direct                            and retroflexion views. Multiple biopsies taken  from prior polypectomy site. Recommendation:           - Repeat colonoscopy date to be determined after                            pending pathology results are reviewed for                            surveillance.                           - Patient has a contact number available for                            emergencies. The signs and symptoms of potential                            delayed complications were discussed with the                            patient. Return to normal activities tomorrow.                            Written discharge instructions were provided to the                            patient.                           - Resume previous diet.                           - Continue present medications.                           - Await pathology results. Docia Chuck. [name], MD 02/15/2016 9:46:42 AM This report has been signed electronically. CC Letter to:             Evie Lacks. Plotnikov MD, MD

## 2016-02-15 NOTE — Patient Instructions (Signed)
Discharge instructions given. Handout on polyps. Resume previous medications. YOU HAD AN ENDOSCOPIC PROCEDURE TODAY AT THE [facility]:   Refer to the procedure report that was given to you for any specific questions about what was found during the examination.  If the procedure report does not answer your questions, please call your gastroenterologist to clarify.  If you requested that your care partner not be given the details of your procedure findings, then the procedure report has been included in a sealed envelope for you to review at your convenience later.  YOU SHOULD EXPECT: Some feelings of bloating in the abdomen. Passage of more gas than usual.  Walking can help get rid of the air that was put into your GI tract during the procedure and reduce the bloating. If you had a lower endoscopy (such as a colonoscopy or flexible sigmoidoscopy) you may notice spotting of blood in your stool or on the toilet paper. If you underwent a bowel prep for your procedure, you may not have a normal bowel movement for a few days.  Please Note:  You might notice some irritation and congestion in your nose or some drainage.  This is from the oxygen used during your procedure.  There is no need for concern and it should clear up in a day or so.  SYMPTOMS TO REPORT IMMEDIATELY:   Following lower endoscopy (colonoscopy or flexible sigmoidoscopy):  Excessive amounts of blood in the stool  Significant tenderness or worsening of abdominal pains  Swelling of the abdomen that is new, acute  Fever of 100F or higher   For urgent or emergent issues, a gastroenterologist can be reached at any hour by calling [phone].   DIET: Your first meal following the procedure should be a small meal and then it is ok to progress to your normal diet. Heavy or fried foods are harder to digest and may make you feel nauseous or bloated.  Likewise, meals heavy in dairy and vegetables can increase bloating.  Drink  plenty of fluids but you should avoid alcoholic beverages for 24 hours.  ACTIVITY:  You should plan to take it easy for the rest of today and you should NOT DRIVE or use heavy machinery until tomorrow (because of the sedation medicines used during the test).    FOLLOW UP: Our staff will call the number listed on your records the next business day following your procedure to check on you and address any questions or concerns that you may have regarding the information given to you following your procedure. If we do not reach you, we will leave a message.  However, if you are feeling well and you are not experiencing any problems, there is no need to return our call.  We will assume that you have returned to your regular daily activities without incident.  If any biopsies were taken you will be contacted by phone or by letter within the next 1-3 weeks.  Please call us at [phone] if you have not heard about the biopsies in 3 weeks.    SIGNATURES/CONFIDENTIALITY: You and/or your care partner have signed paperwork which will be entered into your electronic medical record.  These signatures attest to the fact that that the information above on your After Visit Summary has been reviewed and is understood.  Full responsibility of the confidentiality of this discharge information lies with you and/or your care-partner. 

## 2016-02-15 NOTE — Progress Notes (Signed)
Called to room to assist during endoscopic procedure.  Patient ID and intended procedure confirmed with present staff. Received instructions for my participation in the procedure from the performing physician.  

## 2016-02-16 ENCOUNTER — Telehealth: Payer: Self-pay | Admitting: *Deleted

## 2016-02-16 NOTE — Telephone Encounter (Signed)
  Follow up Call-  Call back number 02/15/2016 01/13/2015  Post procedure Call Back phone  # [phone] wife's cell [phone]  Permission to leave phone message Yes Yes    LMOM

## 2016-02-21 ENCOUNTER — Encounter: Payer: Self-pay | Admitting: Internal Medicine

## 2016-02-21 HISTORY — PX: REPLACEMENT TOTAL KNEE: SUR1224

## 2016-02-23 ENCOUNTER — Telehealth: Payer: Self-pay | Admitting: Internal Medicine

## 2016-02-24 ENCOUNTER — Ambulatory Visit: Payer: Medicare Other | Attending: Adult Reconstructive Orthopaedic Surgery | Admitting: Physical Therapy

## 2016-02-24 DIAGNOSIS — M25661 Stiffness of right knee, not elsewhere classified: Secondary | ICD-10-CM | POA: Diagnosis present

## 2016-02-24 DIAGNOSIS — R262 Difficulty in walking, not elsewhere classified: Secondary | ICD-10-CM | POA: Insufficient documentation

## 2016-02-24 DIAGNOSIS — M25561 Pain in right knee: Secondary | ICD-10-CM | POA: Diagnosis not present

## 2016-02-24 NOTE — Telephone Encounter (Signed)
Spoke with pts wife and reviewed path results and questions were answered.

## 2016-02-27 ENCOUNTER — Encounter: Payer: Self-pay | Admitting: Physical Therapy

## 2016-02-27 NOTE — Therapy (Signed)
[place], [place], [address] Phone: [phone]   Fax:  [phone]  Physical Therapy Treatment  Patient Details  Name: [name] [name] MRN: [id number] Date of Birth: [birth year] Referring Provider: Garlan Fillers MD   Encounter Date: 02/24/2016      PT End of Session - 02/27/16 0913    Visit Number 1   Number of Visits 13   Date for PT Re-Evaluation 04/23/16   PT Start Time 0800   PT Stop Time 0853   PT Time Calculation (min) 53 min   Activity Tolerance Patient tolerated treatment well   Behavior During Therapy [facility] for tasks assessed/performed      Past Medical History  Diagnosis Date  . Hypertension   . Osteoarthritis   . Diabetes mellitus type II   . Hyperlipidemia   . PAF (paroxysmal atrial fibrillation) [facility])     cardiologist--  dr hochrein  (episode prior to liver surgery at [facility] 2014 w/ post-op RVR)  . First degree heart block   . OSA on CPAP   . History of colon polyps   . History of prostate cancer current PSA per pt 0.13    dx March 2014---  s/p radiactive prostate seed implants and intraop radiotherapy in [place], [place]  . ED (erectile dysfunction) of organic origin   . Bilateral renal cysts     benign  . DDD (degenerative disc disease), lumbar   . COPD (chronic obstructive pulmonary disease) ([place])      PFT 2010 due to previous 20y h/o smoking  . Wears glasses   . Sleep apnea     wears cpap  . Allergy   . Rectal cancer ([place])     01-25-2015----per pathology --  complete rectal polypectomy-- showed  invasive carcinoma rising in tubular adnemoa high grade hyperplasia--  per dr Ardis Hughs per MRI  no other area seen -- will have colonoscopy done in 3 months  . Prostate cancer ([place]) 11-11-2012    s/p chemo, radiation     Past Surgical History  Procedure Laterality Date  . Total knee arthroplasty Left 10- 2013  . Vasectomy  1980's w/ gen. anes.  . Eus N/A 02/10/2015   Procedure: LOWER ENDOSCOPIC ULTRASOUND (EUS);  Surgeon: Milus Banister, MD;  Location[facility];  Service: Endoscopy;  Laterality: N/A;  . Radioactive prostate seed implants  May 2014  in [place], [place]  .  right lobe hepatectomy  06-12-2013    [facility] [place]    benign mass  . Cardiovascular stress test  10-01-2014   dr hochrein    normal perfusion study, no ischemia,  normal LV function and wall motion, ef 70%  . Transthoracic echocardiogram  12-26-2012    mild focal basal septal hypertrophy,  grade II diastolic dysfunction,  ef 55-60%,  mild LAE,  trivial MR and TR  . Lumbar fusion  2001  . Knee arthroscopy Bilateral 1990's  . Penile prosthesis implant N/A 04/25/2015    Procedure: PENILE PROSTHESIS THREE PIECE INFLATABLE( COLOPLAST) SCROTAL APPROACH;  Surgeon: Kathie Rhodes, MD;  Location: [facility] [place];  Service: Urology;  Laterality: N/A;  . Colonoscopy    . Polypectomy      There were no vitals filed for this visit.          [facility] PT Assessment - 02/27/16 0001    Assessment   Medical Diagnosis Right knee OA    Referring Provider Demetrius Revel, Earline Mayotte MD  Onset Date/Surgical Date 02/21/16  will have R TKR on February 21, 2016   Hand Dominance Right   Next MD Visit 1 month   Prior Therapy For left knee    Precautions   Precautions None   Restrictions   Weight Bearing Restrictions Yes   Other Position/Activity Restrictions WBAT   Balance Screen   Has the patient fallen in the past 6 months No   Has the patient had a decrease in activity level because of a fear of falling?  No   Is the patient reluctant to leave their home because of a fear of falling?  No   Home Environment   Living Environment Private residence   Type of [place] to enter   Entrance Stairs-Number of Steps 0   Home Layout Two level  basement   Prior Function   Level of [place] Retired   Leisure wants to get back to exericise and yard work wants  to be able to use treadmill and elliptical in the basement   Observation/Other Assessments   Observations bleeding noted around the incision. The pateint reported it was bleeding last night as well. Patient advised to call his doctor as soon as he is able. Limited ROM perfformed today 2nd to bleeding.    Focus on Therapeutic Outcomes (FOTO)  Assess next visit   Posture/Postural Control   Posture/Postural Control Postural limitations   Postural Limitations Forward head;Rounded Shoulders;Anterior pelvic tilt   AROM   Right Knee Extension 7   Right Knee Flexion 50   Left Knee Extension 3   Left Knee Flexion 110   PROM   Right Knee Extension 5   Right Knee Flexion 55   Left Knee Extension 0   Left Knee Flexion 115   Strength   Right Hip Flexion 4/5   Right Hip Extension 4/5   Right Hip ABduction 4/5   Left Hip Flexion 5/5   Left Hip Extension 5/5   Left Hip ABduction 5/5   Right Knee Flexion 4/5   Right Knee Extension 4/5   Left Knee Flexion 5/5   Left Knee Extension 5/5                     [facility] Adult PT Treatment/Exercise - 02/27/16 0001    Knee/Hip Exercises: Standing   Heel Raises Limitations x10    Hip Flexion Limitations standing march x10    Abduction Limitations x10    Extension Limitations x10    Knee/Hip Exercises: Supine   Quad Sets Limitations x10   Short Arc Quad Sets Limitations x10   Straight Leg Raises Limitations 2x5   Modalities   Modalities Cryotherapy   Cryotherapy   Number Minutes Cryotherapy 10 Minutes   Cryotherapy Location Knee   Type of Cryotherapy Ice pack                  PT Short Term Goals - 02/27/16 [id number]    PT SHORT TERM GOAL #1   Title Patient will be I /w HEP for ROM and strengthening    Time 4   Period Weeks   Status New   PT SHORT TERM GOAL #2   Title Patient will increase gross right knee strength/ hip strength  to 4+/5   Time 4   Period Weeks   Status New   PT SHORT TERM GOAL #3   Title Patient will  increase right knee flexion to  90 degrees    Baseline 55 degrees    Time 4   Period Weeks   Status New   PT SHORT TERM GOAL #4   Title Patient will demonstrate full active extension of the right knee.   Time 4   Period Weeks   Status New           PT Long Term Goals - 03-20-16 [id number]    PT LONG TERM GOAL #1   Title Patient will ambulate 1 mile without self reported pain in order to walk hidog    Time 8   Period Weeks   Status New   PT LONG TERM GOAL #2   Title Patient will go up/down stairs with reciprocol gait pattern without pain in order to improve household safety   Time 8   Period Weeks   Status New   PT LONG TERM GOAL #3   Title Patient will stand for 1 hour ewithout increased pain in order to perfrom IADL's    Time 8   Period Weeks   Status New   PT LONG TERM GOAL #4   Title Patient will demsotrate 115 degrees of knee flexion in order to get up and down out of a low chair.                Plan - 03-20-16 0919    Clinical Impression Statement Patient is a 65 year old male S/P right TKA on 02-21-16. He presents with expected limited ROM, strength, and ability to ambulate. His range was not pushed today 2nd to bleeding. He will contact his MD. He would like to get back to walking his dog without pain. He  was seen today for a low complexity evaluation. He would benefit from further skilled therapy to adress the above deficits.    Rehab Potential Good   PT Frequency 2x / week   PT Duration 8 weeks   PT Treatment/Interventions ADLs/Self Care Home Management;Cryotherapy;Electrical Stimulation;Iontophoresis 4mg /ml Dexamethasone;Ultrasound;Functional mobility training;Gait training;Stair training;Neuromuscular re-education;Passive range of motion;Taping;Vasopneumatic Device;Scar mobilization;Manual techniques;Patient/family education;Therapeutic exercise   PT Next Visit Plan Re evaluate post R TKR surgery   PT Home Exercise Plan PRe op exericises and Phase 1 TKR  exericses   Consulted and Agree with Plan of Care Patient      Patient will benefit from skilled therapeutic intervention in order to improve the following deficits and impairments:  Abnormal gait, Decreased activity tolerance, Obesity, Postural dysfunction, Improper body mechanics, Difficulty walking, Decreased strength, Increased fascial restricitons, Decreased mobility, Decreased range of motion  Visit Diagnosis: Pain in right knee - Plan: PT PLAN OF CARE CERT/RE-CERT  Stiffness of right knee, not elsewhere classified - Plan: PT PLAN OF CARE CERT/RE-CERT  Difficulty in walking, not elsewhere classified - Plan: PT PLAN OF CARE CERT/RE-CERT       G-Codes - Mar 20, 2016 0926    Functional Assessment Tool Used clinical decision making, objective measurements    Functional Limitation Mobility: Walking and moving around   Mobility: Walking and Moving Around Current Status [id number]) At least 40 percent but less than 60 percent impaired, limited or restricted   Mobility: Walking and Moving Around Goal Status [phone]) At least 1 percent but less than 20 percent impaired, limited or restricted      Problem List Patient Active Problem List   Diagnosis Date Noted  . Fatigue 08/10/2015  . Erectile dysfunction 04/25/2015  . Anemia due to chronic blood loss 12/21/2014  . GI bleed 12/21/2014  . Chest pain  at rest 09/30/2014  . Edema 07/13/2014  . Allergic rhinitis 03/17/2014  . PAF (paroxysmal atrial fibrillation) ([place]) 05/25/2013  . Cardiomegaly 12/12/2012  . Liver mass 12/12/2012  . Prostate cancer ([place]) 12/11/2012  . Hepatic cyst 12/11/2012  . Hematuria, gross 09/05/2012  . Knee osteoarthritis 09/05/2012  . OSA on CPAP 09/05/2012  . Snoring 09/07/2011  . Paresthesia of left arm 07/06/2011  . Gout 03/13/2011  . Well adult exam 03/03/2011  . Arthritis of knee 03/03/2011  . MENTAL CONFUSION 03/09/2010  . GAIT DISTURBANCE 03/09/2010  . HEAT STROKE 03/09/2010  . FOOT PAIN 11/08/2009  .  CHEST PAIN UNSPECIFIED 08/19/2009  . COPD 07/08/2009  . TOBACCO USE, QUIT 07/08/2009  . RECTAL BLEEDING 12/07/2008  . PERSONAL HISTORY OF COLONIC POLYPS 12/07/2008  . Dyslipidemia 03/02/2008  . HEMATOCHEZIA 03/02/2008  . COUGH 03/02/2008  . HYPOKALEMIA 02/03/2008  . Acute sinusitis 02/03/2008  . SYNCOPE 02/03/2008  . DM (diabetes mellitus), type 2 with complications ([place]) 123XX123  . Pain in joint, shoulder region 11/27/2007  . ERECTILE DYSFUNCTION 10/30/2007  . Essential hypertension 10/30/2007  . OSTEOARTHRITIS 10/30/2007  . LOW BACK PAIN 10/30/2007    Carney Living PT DPT  02/27/2016, 9:34 AM  [facility] [address] [place], [place], [address] Phone: [phone]   Fax:  [phone]  Name: [name] [name] MRN: [id number] Date of Birth: Nov 09, 1950

## 2016-02-28 ENCOUNTER — Other Ambulatory Visit: Payer: Self-pay | Admitting: *Deleted

## 2016-02-28 ENCOUNTER — Ambulatory Visit: Payer: Medicare Other | Admitting: Physical Therapy

## 2016-02-28 DIAGNOSIS — M25661 Stiffness of right knee, not elsewhere classified: Secondary | ICD-10-CM

## 2016-02-28 DIAGNOSIS — M25561 Pain in right knee: Secondary | ICD-10-CM

## 2016-02-28 DIAGNOSIS — R262 Difficulty in walking, not elsewhere classified: Secondary | ICD-10-CM

## 2016-02-28 MED ORDER — MONTELUKAST SODIUM 10 MG PO TABS: 10.0000 mg | ORAL_TABLET | Freq: Every day | ORAL | Status: DC

## 2016-02-28 NOTE — Therapy (Signed)
[place], [place], [address] Phone: [phone]   Fax:  [phone]  Physical Therapy Treatment  Patient Details  Name: [name] [name] MRN: [id number] Date of Birth: October 07, 1950 Referring Provider: Garlan Fillers MD   Encounter Date: 02/28/2016      PT End of Session - 02/28/16 1110    Visit Number 2   Number of Visits 13   Date for PT Re-Evaluation 04/23/16   Authorization Type VA   Authorization Time Period 03-08-16   PT Start Time 1100   PT Stop Time 1145   PT Time Calculation (min) 45 min      Past Medical History  Diagnosis Date  . Hypertension   . Osteoarthritis   . Diabetes mellitus type II   . Hyperlipidemia   . PAF (paroxysmal atrial fibrillation) [facility])     cardiologist--  dr hochrein  (episode prior to liver surgery at [facility] 2014 w/ post-op RVR)  . First degree heart block   . OSA on CPAP   . History of colon polyps   . History of prostate cancer current PSA per pt 0.13    dx March 2014---  s/p radiactive prostate seed implants and intraop radiotherapy in [place], [place]  . ED (erectile dysfunction) of organic origin   . Bilateral renal cysts     benign  . DDD (degenerative disc disease), lumbar   . COPD (chronic obstructive pulmonary disease) ([place])      PFT 2010 due to previous 20y h/o smoking  . Wears glasses   . Sleep apnea     wears cpap  . Allergy   . Rectal cancer ([place])     01-25-2015----per pathology --  complete rectal polypectomy-- showed  invasive carcinoma rising in tubular adnemoa high grade hyperplasia--  per dr Ardis Hughs per MRI  no other area seen -- will have colonoscopy done in 3 months  . Prostate cancer ([place]) 11-11-2012    s/p chemo, radiation     Past Surgical History  Procedure Laterality Date  . Total knee arthroplasty Left 10- 2013  . Vasectomy  1980's w/ gen. anes.  . Eus N/A 02/10/2015    Procedure: LOWER ENDOSCOPIC ULTRASOUND (EUS);  Surgeon:  Milus Banister, MD;  Location: [facility];  Service: Endoscopy;  Laterality: N/A;  . Radioactive prostate seed implants  May 2014  in [place], [place]  .  right lobe hepatectomy  06-12-2013    [facility] [place]    benign mass  . Cardiovascular stress test  10-01-2014   dr hochrein    normal perfusion study, no ischemia,  normal LV function and wall motion, ef 70%  . Transthoracic echocardiogram  12-26-2012    mild focal basal septal hypertrophy,  grade II diastolic dysfunction,  ef 55-60%,  mild LAE,  trivial MR and TR  . Lumbar fusion  2001  . Knee arthroscopy Bilateral 1990's  . Penile prosthesis implant N/A 04/25/2015    Procedure: PENILE PROSTHESIS THREE PIECE INFLATABLE( COLOPLAST) SCROTAL APPROACH;  Surgeon: Kathie Rhodes, MD;  Location: [place];  Service: Urology;  Laterality: N/A;  . Colonoscopy    . Polypectomy      There were no vitals filed for this visit.      Subjective Assessment - 02/28/16 1314    Subjective MD saif to let the incision dry   Currently in Pain? No/denies  [place] Adult PT Treatment/Exercise - 02/28/16 0001    Knee/Hip Exercises: Standing   Heel Raises Limitations x20    Hip Flexion Limitations standing march x15    Abduction Limitations x15   Extension Limitations x15   Knee/Hip Exercises: Seated   Other Seated Knee/Hip Exercises seated quad set x 10 , seated SLR with quad set x10, seated heel slide with gentle scoot forward for flexion stretching ~ 80 degrees seated   Knee/Hip Exercises: Supine   Quad Sets Limitations x10   Short Arc Quad Sets Limitations x10   Heel Slides Limitations x10  75 degrees   Straight Leg Raises Limitations 10x2                  PT Short Term Goals - 02/27/16 [id number]    PT SHORT TERM GOAL #1   Title Patient will be I /w HEP for ROM and strengthening    Time 4   Period Weeks   Status New   PT SHORT TERM GOAL #2   Title Patient will increase gross right knee  strength/ hip strength  to 4+/5   Time 4   Period Weeks   Status New   PT SHORT TERM GOAL #3   Title Patient will increase right knee flexion to 90 degrees    Baseline 55 degrees    Time 4   Period Weeks   Status New   PT SHORT TERM GOAL #4   Title Patient will demonstrate full active extension of the right knee.   Time 4   Period Weeks   Status New           PT Long Term Goals - 02/27/16 [id number]    PT LONG TERM GOAL #1   Title Patient will ambulate 1 mile without self reported pain in order to walk hidog    Time 8   Period Weeks   Status New   PT LONG TERM GOAL #2   Title Patient will go up/down stairs with reciprocol gait pattern without pain in order to improve household safety   Time 8   Period Weeks   Status New   PT LONG TERM GOAL #3   Title Patient will stand for 1 hour ewithout increased pain in order to perfrom IADL's    Time 8   Period Weeks   Status New   PT LONG TERM GOAL #4   Title Patient will demsotrate 115 degrees of knee flexion in order to get up and down out of a low chair.                Plan - 02/28/16 1311    Clinical Impression Statement Pt is 7 days S/P right knee replacement. He reports using SPC in home. He is performing his HEP as instructed. MD recommended he uncover his incision and allow it to dry. Pt has some drainage at distal incision. Pt declines modalities, He has ice machine at home.    PT Next Visit Plan pt is 1 week s/p R TKR, low level exercises, gait, check incision-      Patient will benefit from skilled therapeutic intervention in order to improve the following deficits and impairments:  Abnormal gait, Decreased activity tolerance, Obesity, Postural dysfunction, Improper body mechanics, Difficulty walking, Decreased strength, Increased fascial restricitons, Decreased mobility, Decreased range of motion  Visit Diagnosis: Pain in right knee  Stiffness of right knee, not elsewhere classified  Difficulty in walking, not  elsewhere classified  Problem List Patient Active Problem List   Diagnosis Date Noted  . Fatigue 08/10/2015  . Erectile dysfunction 04/25/2015  . Anemia due to chronic blood loss 12/21/2014  . GI bleed 12/21/2014  . Chest pain at rest 09/30/2014  . Edema 07/13/2014  . Allergic rhinitis 03/17/2014  . PAF (paroxysmal atrial fibrillation) ([place]) 05/25/2013  . Cardiomegaly 12/12/2012  . Liver mass 12/12/2012  . Prostate cancer ([place]) 12/11/2012  . Hepatic cyst 12/11/2012  . Hematuria, gross 09/05/2012  . Knee osteoarthritis 09/05/2012  . OSA on CPAP 09/05/2012  . Snoring 09/07/2011  . Paresthesia of left arm 07/06/2011  . Gout 03/13/2011  . Well adult exam 03/03/2011  . Arthritis of knee 03/03/2011  . MENTAL CONFUSION 03/09/2010  . GAIT DISTURBANCE 03/09/2010  . HEAT STROKE 03/09/2010  . FOOT PAIN 11/08/2009  . CHEST PAIN UNSPECIFIED 08/19/2009  . COPD 07/08/2009  . TOBACCO USE, QUIT 07/08/2009  . RECTAL BLEEDING 12/07/2008  . PERSONAL HISTORY OF COLONIC POLYPS 12/07/2008  . Dyslipidemia 03/02/2008  . HEMATOCHEZIA 03/02/2008  . COUGH 03/02/2008  . HYPOKALEMIA 02/03/2008  . Acute sinusitis 02/03/2008  . SYNCOPE 02/03/2008  . DM (diabetes mellitus), type 2 with complications ([place]) 123XX123  . Pain in joint, shoulder region 11/27/2007  . ERECTILE DYSFUNCTION 10/30/2007  . Essential hypertension 10/30/2007  . OSTEOARTHRITIS 10/30/2007  . LOW BACK PAIN 10/30/2007    Dorene Ar, PTA 02/28/2016, 1:17 PM  [facility] [address] [place], [place], [address] Phone: [phone]   Fax:  [phone]  Name: [name] [name] MRN: [id number] Date of Birth: 03/28/1951

## 2016-03-01 ENCOUNTER — Ambulatory Visit: Payer: Medicare Other | Admitting: Physical Therapy

## 2016-03-01 DIAGNOSIS — R262 Difficulty in walking, not elsewhere classified: Secondary | ICD-10-CM

## 2016-03-01 DIAGNOSIS — M25561 Pain in right knee: Secondary | ICD-10-CM | POA: Diagnosis not present

## 2016-03-01 DIAGNOSIS — M25661 Stiffness of right knee, not elsewhere classified: Secondary | ICD-10-CM

## 2016-03-01 NOTE — Therapy (Signed)
[place], [place], [address] Phone: [phone]   Fax:  [phone]  Physical Therapy Treatment  Patient Details  Name: [name] [name] MRN: [id number] Date of Birth: Aug 22, 1951 Referring Provider: Garlan Fillers MD   Encounter Date: 03/01/2016      PT End of Session - 03/01/16 2032    Authorization Type VA   Authorization Time Period 03-08-16   PT Start Time 1145   PT Stop Time 1227   PT Time Calculation (min) 42 min   Activity Tolerance Patient tolerated treatment well   Behavior During Therapy [facility] for tasks assessed/performed      Past Medical History  Diagnosis Date  . Hypertension   . Osteoarthritis   . Diabetes mellitus type II   . Hyperlipidemia   . PAF (paroxysmal atrial fibrillation) [facility])     cardiologist--  dr hochrein  (episode prior to liver surgery at [facility] 2014 w/ post-op RVR)  . First degree heart block   . OSA on CPAP   . History of colon polyps   . History of prostate cancer current PSA per pt 0.13    dx March 2014---  s/p radiactive prostate seed implants and intraop radiotherapy in [place], [place]  . ED (erectile dysfunction) of organic origin   . Bilateral renal cysts     benign  . DDD (degenerative disc disease), lumbar   . COPD (chronic obstructive pulmonary disease) ([place])      PFT 2010 due to previous 20y h/o smoking  . Wears glasses   . Sleep apnea     wears cpap  . Allergy   . Rectal cancer ([place])     01-25-2015----per pathology --  complete rectal polypectomy-- showed  invasive carcinoma rising in tubular adnemoa high grade hyperplasia--  per dr Ardis Hughs per MRI  no other area seen -- will have colonoscopy done in 3 months  . Prostate cancer ([place]) 11-11-2012    s/p chemo, radiation     Past Surgical History  Procedure Laterality Date  . Total knee arthroplasty Left 10- 2013  . Vasectomy  1980's w/ gen. anes.  . Eus N/A 02/10/2015    Procedure: LOWER  ENDOSCOPIC ULTRASOUND (EUS);  Surgeon: Milus Banister, MD;  Location: [facility];  Service: Endoscopy;  Laterality: N/A;  . Radioactive prostate seed implants  May 2014  in [place], [place]  .  right lobe hepatectomy  06-12-2013    [facility] [place]    benign mass  . Cardiovascular stress test  10-01-2014   dr hochrein    normal perfusion study, no ischemia,  normal LV function and wall motion, ef 70%  . Transthoracic echocardiogram  12-26-2012    mild focal basal septal hypertrophy,  grade II diastolic dysfunction,  ef 55-60%,  mild LAE,  trivial MR and TR  . Lumbar fusion  2001  . Knee arthroscopy Bilateral 1990's  . Penile prosthesis implant N/A 04/25/2015    Procedure: PENILE PROSTHESIS THREE PIECE INFLATABLE( COLOPLAST) SCROTAL APPROACH;  Surgeon: Kathie Rhodes, MD;  Location: [place];  Service: Urology;  Laterality: N/A;  . Colonoscopy    . Polypectomy      There were no vitals filed for this visit.      Subjective Assessment - 03/01/16 1151    Subjective Patient reports no pain today. He has been doing his exercises at home.    Pertinent History Left TKR October 2013, DM2, Prostate CA remission; low back surgery  Limitations Standing;Lifting;Walking   How long can you sit comfortably? 1 hour    How long can you stand comfortably? > 5 min    How long can you walk comfortably? ambulating limited household distances    Patient Stated Goals Patient would like to walk the dog and go up and down the steps. Participate in activity at church. Get down on hands and knees to [place] [employer].    Currently in Pain? No/denies                         [facility] Adult PT Treatment/Exercise - 03/01/16 0001    Ambulation/Gait   Gait Comments Slow step too gait pattern with the cane. SBA w/ gait belt. Cuing to point toe forward. Amb 100'x2.    Knee/Hip Exercises: Standing   Heel Raises Limitations x20    Hip Flexion Limitations standing march x15    Abduction Limitations x15    Extension Limitations x15   Knee/Hip Exercises: Supine   Quad Sets Limitations x10   Short Arc Quad Sets Limitations x10   Heel Slides Limitations x10  75 degrees   Straight Leg Raises Limitations 10x2   Modalities   Modalities --  Patient declined. He reports he is going right home   Manual Therapy   Manual therapy comments PROM into all planes; Hamstring release with extension stretch;                 PT Education - 03/01/16 2028    Education provided Yes   Education Details Continue with POC    Person(s) Educated Patient   Methods Explanation;Demonstration;Tactile cues;Verbal cues   Comprehension Verbalized understanding;Returned demonstration          PT Short Term Goals - 03/01/16 2038    PT SHORT TERM GOAL #1   Title Patient will be I /w HEP for ROM and strengthening    Time 4   Period Weeks   Status On-going   PT SHORT TERM GOAL #2   Title Patient will increase gross right knee strength/ hip strength  to 4+/5   Time 4   Period Weeks   Status On-going   PT SHORT TERM GOAL #3   Title Patient will increase right knee flexion to 90 degrees    Baseline 55 degrees    Period Weeks   Status On-going   PT SHORT TERM GOAL #4   Title Patient will demonstrate full active extension of the right knee.   Time 4   Period Weeks   Status On-going           PT Long Term Goals - 02/27/16 [id number]    PT LONG TERM GOAL #1   Title Patient will ambulate 1 mile without self reported pain in order to walk hidog    Time 8   Period Weeks   Status New   PT LONG TERM GOAL #2   Title Patient will go up/down stairs with reciprocol gait pattern without pain in order to improve household safety   Time 8   Period Weeks   Status New   PT LONG TERM GOAL #3   Title Patient will stand for 1 hour ewithout increased pain in order to perfrom IADL's    Time 8   Period Weeks   Status New   PT LONG TERM GOAL #4   Title Patient will demsotrate 115 degrees of knee flexion in order  to get up and down out of  a low chair.                Plan - 03/01/16 2032    Clinical Impression Statement Patient made good progress today. PT began cane training with him. He required SBA with a gait belt but had no loss of balance. He required cuing for toe out. His range was measured at 10-90 degrees. He was encouraged to work on his extension at home.    Rehab Potential Good   PT Frequency 2x / week   PT Duration 8 weeks   PT Treatment/Interventions ADLs/Self Care Home Management;Cryotherapy;Electrical Stimulation;Iontophoresis 4mg /ml Dexamethasone;Ultrasound;Functional mobility training;Gait training;Stair training;Neuromuscular re-education;Passive range of motion;Taping;Vasopneumatic Device;Scar mobilization;Manual techniques;Patient/family education;Therapeutic exercise   PT Next Visit Plan pt is 1 week s/p R TKR, low level exercises, gait, check incision-   PT Home Exercise Plan PRe op exericises and Phase 1 TKR exericses   Consulted and Agree with Plan of Care Patient      Patient will benefit from skilled therapeutic intervention in order to improve the following deficits and impairments:  Abnormal gait, Decreased activity tolerance, Obesity, Postural dysfunction, Improper body mechanics, Difficulty walking, Decreased strength, Increased fascial restricitons, Decreased mobility, Decreased range of motion  Visit Diagnosis: Pain in right knee  Stiffness of right knee, not elsewhere classified  Difficulty in walking, not elsewhere classified     Problem List Patient Active Problem List   Diagnosis Date Noted  . Fatigue 08/10/2015  . Erectile dysfunction 04/25/2015  . Anemia due to chronic blood loss 12/21/2014  . GI bleed 12/21/2014  . Chest pain at rest 09/30/2014  . Edema 07/13/2014  . Allergic rhinitis 03/17/2014  . PAF (paroxysmal atrial fibrillation) ([place]) 05/25/2013  . Cardiomegaly 12/12/2012  . Liver mass 12/12/2012  . Prostate cancer ([place]) 12/11/2012   . Hepatic cyst 12/11/2012  . Hematuria, gross 09/05/2012  . Knee osteoarthritis 09/05/2012  . OSA on CPAP 09/05/2012  . Snoring 09/07/2011  . Paresthesia of left arm 07/06/2011  . Gout 03/13/2011  . Well adult exam 03/03/2011  . Arthritis of knee 03/03/2011  . MENTAL CONFUSION 03/09/2010  . GAIT DISTURBANCE 03/09/2010  . HEAT STROKE 03/09/2010  . FOOT PAIN 11/08/2009  . CHEST PAIN UNSPECIFIED 08/19/2009  . COPD 07/08/2009  . TOBACCO USE, QUIT 07/08/2009  . RECTAL BLEEDING 12/07/2008  . PERSONAL HISTORY OF COLONIC POLYPS 12/07/2008  . Dyslipidemia 03/02/2008  . HEMATOCHEZIA 03/02/2008  . COUGH 03/02/2008  . HYPOKALEMIA 02/03/2008  . Acute sinusitis 02/03/2008  . SYNCOPE 02/03/2008  . DM (diabetes mellitus), type 2 with complications ([place]) 123XX123  . Pain in joint, shoulder region 11/27/2007  . ERECTILE DYSFUNCTION 10/30/2007  . Essential hypertension 10/30/2007  . OSTEOARTHRITIS 10/30/2007  . LOW BACK PAIN 10/30/2007    Carney Living  PT DPT   03/01/2016, 8:44 PM  [facility] [address] [place], [place], [address] Phone: [phone]   Fax:  [phone]  Name: [name] [name] MRN: [id number] Date of Birth: 09-11-1950

## 2016-03-05 ENCOUNTER — Ambulatory Visit: Payer: Medicare Other | Attending: Adult Reconstructive Orthopaedic Surgery | Admitting: Physical Therapy

## 2016-03-05 DIAGNOSIS — M25561 Pain in right knee: Secondary | ICD-10-CM | POA: Diagnosis not present

## 2016-03-05 DIAGNOSIS — M25661 Stiffness of right knee, not elsewhere classified: Secondary | ICD-10-CM | POA: Diagnosis present

## 2016-03-05 DIAGNOSIS — R262 Difficulty in walking, not elsewhere classified: Secondary | ICD-10-CM | POA: Insufficient documentation

## 2016-03-05 NOTE — Patient Instructions (Addendum)
Clam Shell 45 Degrees    Lying with hips and knees bent 45, one pillow between knees and ankles. Lift knee. Be sure pelvis does not roll backward. Do not arch back. Do _10__ times, right leg, _1-2__ times per day.  http://ss.exer.us/74   Copyright  VHI. All rights reserved.   Abduction    Lift leg up toward ceiling and lower slowly. Repeat __10__ times right  leg. Do __1-2__ sessions per day.  http://gt2.exer.us/385   Copyright  VHI. All rights reserved.

## 2016-03-05 NOTE — Therapy (Signed)
[place], [place], [address] Phone: [phone]   Fax:  [phone]  Physical Therapy Treatment  Patient Details  Name: [name] [name] MRN: [id number] Date of Birth: 1951/03/04 Referring Provider: Garlan Fillers MD   Encounter Date: 03/05/2016      PT End of Session - 03/05/16 1333    Visit Number 4   Number of Visits 13   Date for PT Re-Evaluation 04/23/16   Authorization Type VA   Authorization Time Period 03-08-16   PT Start Time 1329   PT Stop Time 1415   PT Time Calculation (min) 46 min   Activity Tolerance Patient tolerated treatment well   Behavior During Therapy [facility] for tasks assessed/performed      Past Medical History  Diagnosis Date  . Hypertension   . Osteoarthritis   . Diabetes mellitus type II   . Hyperlipidemia   . PAF (paroxysmal atrial fibrillation) [facility])     cardiologist--  dr hochrein  (episode prior to liver surgery at [facility] 2014 w/ post-op RVR)  . First degree heart block   . OSA on CPAP   . History of colon polyps   . History of prostate cancer current PSA per pt 0.13    dx March 2014---  s/p radiactive prostate seed implants and intraop radiotherapy in [place], [place]  . ED (erectile dysfunction) of organic origin   . Bilateral renal cysts     benign  . DDD (degenerative disc disease), lumbar   . COPD (chronic obstructive pulmonary disease) ([place])      PFT 2010 due to previous 20y h/o smoking  . Wears glasses   . Sleep apnea     wears cpap  . Allergy   . Rectal cancer ([place])     01-25-2015----per pathology --  complete rectal polypectomy-- showed  invasive carcinoma rising in tubular adnemoa high grade hyperplasia--  per dr Ardis Hughs per MRI  no other area seen -- will have colonoscopy done in 3 months  . Prostate cancer ([place]) 11-11-2012    s/p chemo, radiation     Past Surgical History  Procedure Laterality Date  . Total knee arthroplasty Left 10- 2013  .  Vasectomy  1980's w/ gen. anes.  . Eus N/A 02/10/2015    Procedure: LOWER ENDOSCOPIC ULTRASOUND (EUS);  Surgeon: Milus Banister, MD;  Location: [facility];  Service: Endoscopy;  Laterality: N/A;  . Radioactive prostate seed implants  May 2014  in [place], [place]  .  right lobe hepatectomy  06-12-2013    [facility] [place]    benign mass  . Cardiovascular stress test  10-01-2014   dr hochrein    normal perfusion study, no ischemia,  normal LV function and wall motion, ef 70%  . Transthoracic echocardiogram  12-26-2012    mild focal basal septal hypertrophy,  grade II diastolic dysfunction,  ef 55-60%,  mild LAE,  trivial MR and TR  . Lumbar fusion  2001  . Knee arthroscopy Bilateral 1990's  . Penile prosthesis implant N/A 04/25/2015    Procedure: PENILE PROSTHESIS THREE PIECE INFLATABLE( COLOPLAST) SCROTAL APPROACH;  Surgeon: Kathie Rhodes, MD;  Location: [place];  Service: Urology;  Laterality: N/A;  . Colonoscopy    . Polypectomy      There were no vitals filed for this visit.      Subjective Assessment - 03/05/16 1327    Subjective Pt reporting stiffness and discomfort in R knee. Pt reporting he  has been doing his HEP.    Pertinent History Left TKR October 2013, DM2, Prostate CA remission; low back surgery    Limitations Standing;Lifting;Walking   How long can you sit comfortably? 1 hour    How long can you stand comfortably? > 5 min    How long can you walk comfortably? ambulating limited household distances    Diagnostic tests nothing post op    Patient Stated Goals Patient would like to walk the dog and go up and down the steps. Participate in activity at church. Get down on hands and knees to [place] [employer].    Currently in Pain? Yes   Pain Score 1    Pain Location Knee   Pain Orientation Right   Pain Descriptors / Indicators Discomfort;Tightness   Pain Type Acute pain;Surgical pain   Pain Onset More than a month ago   Multiple Pain Sites No            [facility] PT  Assessment - 03/05/16 0001    AROM   Right Knee Extension 8   Right Knee Flexion 90                     [facility] Adult PT Treatment/Exercise - 03/05/16 0001    Ambulation/Gait   Gait Comments Pt amb 150 feet with straight cane with step through gait pattern, with increased time for amb with decreased terminal knee extension on R and instrctions for heel to toe gait pattern bilateraly.    Exercises   Exercises Other Exercises  NuStep level 5, 7 minutes   Other Exercises  Nustep level 5, 7 minutes   Knee/Hip Exercises: Standing   Heel Raises Limitations x20   Hip Flexion Limitations standing march x15    Abduction Limitations x20   Extension Limitations x20   Other Standing Knee Exercises mini squats x 15   Knee/Hip Exercises: Seated   Other Seated Knee/Hip Exercises seated towel slides on R LE x 15   Knee/Hip Exercises: Supine   Quad Sets Limitations x10   Short Arc Quad Sets Limitations x10   Heel Slides Limitations x10  75 degrees   Straight Leg Raises Limitations 10x2   Modalities   Modalities Cryotherapy   Cryotherapy   Number Minutes Cryotherapy 10 Minutes   Cryotherapy Location Knee   Type of Cryotherapy Ice pack      R knee flexion: 90 degrees R knee extension: -8 degrees          PT Education - 03/05/16 1357    Education provided Yes   Education Details Instructed in hip abd exercises and reviewed HEP. Pt also instructed in heel to toe gait pattern with Straight Cane.    Person(s) Educated Patient   Methods Explanation;Demonstration;Handout   Comprehension Verbalized understanding;Returned demonstration;Verbal cues required          PT Short Term Goals - 03/05/16 1359    PT SHORT TERM GOAL #1   Title Patient will be I /w HEP for ROM and strengthening    Time 4   Period Weeks   Status On-going   PT SHORT TERM GOAL #2   Title Patient will increase gross right knee strength/ hip strength  to 4+/5   Time 4   Period Weeks   Status On-going    PT SHORT TERM GOAL #3   Title Patient will increase right knee flexion to 90 degrees    Baseline 55 degrees    Time 4   Period  Weeks   Status On-going   PT SHORT TERM GOAL #4   Title Patient will demonstrate full active extension of the right knee.   Period Weeks   Status On-going           PT Long Term Goals - 02/27/16 [id number]    PT LONG TERM GOAL #1   Title Patient will ambulate 1 mile without self reported pain in order to walk hidog    Time 8   Period Weeks   Status New   PT LONG TERM GOAL #2   Title Patient will go up/down stairs with reciprocol gait pattern without pain in order to improve household safety   Time 8   Period Weeks   Status New   PT LONG TERM GOAL #3   Title Patient will stand for 1 hour ewithout increased pain in order to perfrom IADL's    Time 8   Period Weeks   Status New   PT LONG TERM GOAL #4   Title Patient will demsotrate 115 degrees of knee flexion in order to get up and down out of a low chair.                Plan - 03/05/16 1335    Clinical Impression Statement Pt tolerated treatment well. Pt is walking with a straight cane progressing toward more heel to toe giat pattern to elicit R terminal knee extension. Pt has been doing his HEP and tolerated progression well during session today. Pt reporting  increased discomfort during exercises.    Rehab Potential Good   PT Frequency 2x / week   PT Duration 8 weeks   PT Treatment/Interventions ADLs/Self Care Home Management;Cryotherapy;Electrical Stimulation;Iontophoresis 4mg /ml Dexamethasone;Ultrasound;Functional mobility training;Gait training;Stair training;Neuromuscular re-education;Passive range of motion;Taping;Vasopneumatic Device;Scar mobilization;Manual techniques;Patient/family education;Therapeutic exercise   PT Next Visit Plan pt is 1 week s/p R TKR, low level exercises, gait, check incision-   PT Home Exercise Plan Pre op exericises and Phase 1 TKR exericses, progress as pt  tolerates. Hip abduction in sidelying added today   Consulted and Agree with Plan of Care Patient      Patient will benefit from skilled therapeutic intervention in order to improve the following deficits and impairments:  Abnormal gait, Decreased activity tolerance, Obesity, Postural dysfunction, Improper body mechanics, Difficulty walking, Decreased strength, Increased fascial restricitons, Decreased mobility, Decreased range of motion  Visit Diagnosis: Pain in right knee  Stiffness of right knee, not elsewhere classified  Difficulty in walking, not elsewhere classified     Problem List Patient Active Problem List   Diagnosis Date Noted  . Fatigue 08/10/2015  . Erectile dysfunction 04/25/2015  . Anemia due to chronic blood loss 12/21/2014  . GI bleed 12/21/2014  . Chest pain at rest 09/30/2014  . Edema 07/13/2014  . Allergic rhinitis 03/17/2014  . PAF (paroxysmal atrial fibrillation) ([place]) 05/25/2013  . Cardiomegaly 12/12/2012  . Liver mass 12/12/2012  . Prostate cancer ([place]) 12/11/2012  . Hepatic cyst 12/11/2012  . Hematuria, gross 09/05/2012  . Knee osteoarthritis 09/05/2012  . OSA on CPAP 09/05/2012  . Snoring 09/07/2011  . Paresthesia of left arm 07/06/2011  . Gout 03/13/2011  . Well adult exam 03/03/2011  . Arthritis of knee 03/03/2011  . MENTAL CONFUSION 03/09/2010  . GAIT DISTURBANCE 03/09/2010  . HEAT STROKE 03/09/2010  . FOOT PAIN 11/08/2009  . CHEST PAIN UNSPECIFIED 08/19/2009  . COPD 07/08/2009  . TOBACCO USE, QUIT 07/08/2009  . RECTAL BLEEDING 12/07/2008  . PERSONAL HISTORY OF  COLONIC POLYPS 12/07/2008  . Dyslipidemia 03/02/2008  . HEMATOCHEZIA 03/02/2008  . COUGH 03/02/2008  . HYPOKALEMIA 02/03/2008  . Acute sinusitis 02/03/2008  . SYNCOPE 02/03/2008  . DM (diabetes mellitus), type 2 with complications ([place]) 123XX123  . Pain in joint, shoulder region 11/27/2007  . ERECTILE DYSFUNCTION 10/30/2007  . Essential hypertension 10/30/2007  .  OSTEOARTHRITIS 10/30/2007  . LOW BACK PAIN 10/30/2007    Oretha Caprice, MPT  03/05/2016, 2:31 PM  [facility] [address] [place], [place], [address] Phone: [phone]   Fax:  [phone]  Name: THEOREN SCHRAMM MRN: [id number] Date of Birth: 1951/05/27

## 2016-03-07 ENCOUNTER — Ambulatory Visit: Payer: Medicare Other | Admitting: Physical Therapy

## 2016-03-12 ENCOUNTER — Ambulatory Visit: Payer: Medicare Other | Admitting: Physical Therapy

## 2016-03-12 DIAGNOSIS — M25561 Pain in right knee: Secondary | ICD-10-CM

## 2016-03-12 DIAGNOSIS — M25661 Stiffness of right knee, not elsewhere classified: Secondary | ICD-10-CM

## 2016-03-12 DIAGNOSIS — R262 Difficulty in walking, not elsewhere classified: Secondary | ICD-10-CM

## 2016-03-12 NOTE — Therapy (Signed)
[facility] [place], [place], [address] Phone: [phone]   Fax:  [phone]  Physical Therapy Treatment  Patient Details  Name: [name] [name] MRN: [id number] Date of Birth: 28-Jan-1951 Referring Provider: Garlan Fillers MD   Encounter Date: 03/12/2016      PT End of Session - 03/12/16 0908    Visit Number 5   Number of Visits 13   Date for PT Re-Evaluation 04/23/16   Authorization Type VA   Authorization Time Period 03-08-16   PT Start Time 0900   PT Stop Time 0955   PT Time Calculation (min) 55 min   Activity Tolerance Patient tolerated treatment well   Behavior During Therapy [facility] for tasks assessed/performed      Past Medical History  Diagnosis Date  . Hypertension   . Osteoarthritis   . Diabetes mellitus type II   . Hyperlipidemia   . PAF (paroxysmal atrial fibrillation) [facility])     cardiologist--  dr hochrein  (episode prior to liver surgery at [facility] 2014 w/ post-op RVR)  . First degree heart block   . OSA on CPAP   . History of colon polyps   . History of prostate cancer current PSA per pt 0.13    dx March 2014---  s/p radiactive prostate seed implants and intraop radiotherapy in [place], [place]  . ED (erectile dysfunction) of organic origin   . Bilateral renal cysts     benign  . DDD (degenerative disc disease), lumbar   . COPD (chronic obstructive pulmonary disease) ([place])      PFT 2010 due to previous 20y h/o smoking  . Wears glasses   . Sleep apnea     wears cpap  . Allergy   . Rectal cancer ([place])     01-25-2015----per pathology --  complete rectal polypectomy-- showed  invasive carcinoma rising in tubular adnemoa high grade hyperplasia--  per dr Ardis Hughs per MRI  no other area seen -- will have colonoscopy done in 3 months  . Prostate cancer ([place]) 11-11-2012    s/p chemo, radiation     Past Surgical History  Procedure Laterality Date  . Total knee arthroplasty Left 10- 2013  .  Vasectomy  1980's w/ gen. anes.  . Eus N/A 02/10/2015    Procedure: LOWER ENDOSCOPIC ULTRASOUND (EUS);  Surgeon: Milus Banister, MD;  Location: [facility];  Service: Endoscopy;  Laterality: N/A;  . Radioactive prostate seed implants  May 2014  in [place], [place]  .  right lobe hepatectomy  06-12-2013    [facility] [place]    benign mass  . Cardiovascular stress test  10-01-2014   dr hochrein    normal perfusion study, no ischemia,  normal LV function and wall motion, ef 70%  . Transthoracic echocardiogram  12-26-2012    mild focal basal septal hypertrophy,  grade II diastolic dysfunction,  ef 55-60%,  mild LAE,  trivial MR and TR  . Lumbar fusion  2001  . Knee arthroscopy Bilateral 1990's  . Penile prosthesis implant N/A 04/25/2015    Procedure: PENILE PROSTHESIS THREE PIECE INFLATABLE( COLOPLAST) SCROTAL APPROACH;  Surgeon: Kathie Rhodes, MD;  Location: [facility];  Service: Urology;  Laterality: N/A;  . Colonoscopy    . Polypectomy      There were no vitals filed for this visit.      Subjective Assessment - 03/12/16 0851    Subjective Patient reports he has had some tender spots in his knee.  He had some trouble sleeping last night. He reports no pain today.    Pertinent History Left TKR October 2013, DM2, Prostate CA remission; low back surgery    Limitations Standing;Lifting;Walking   How long can you sit comfortably? 1 hour    How long can you stand comfortably? > 5 min    How long can you walk comfortably? ambulating limited household distances    Diagnostic tests nothing post op    Patient Stated Goals Patient would like to walk the dog and go up and down the steps. Participate in activity at church. Get down on hands and knees to [place] [employer].    Currently in Pain? No/denies                         [facility] Adult PT Treatment/Exercise - 03/12/16 0001    Ambulation/Gait   Gait Comments Pt amb 300 feet with straight cane with step through gait pattern, with  increased time for amb with decreased terminal knee extension on R and instrctions for heel to toe gait pattern bilateraly.    Exercises   Exercises Other Exercises  NuStep level 5, 7 minutes   Other Exercises  Nustep level 5, 7 minutes   Knee/Hip Exercises: Standing   Heel Raises Limitations x30   Hip Flexion Limitations standing march x20   Forward Step Up Limitations 6"2x10    Functional Squat Limitations 2x10    Knee/Hip Exercises: Supine   Straight Leg Raises Limitations 3x10   Modalities   Modalities Cryotherapy   Cryotherapy   Number Minutes Cryotherapy 10 Minutes   Cryotherapy Location Knee   Type of Cryotherapy Ice pack   Manual Therapy   Manual therapy comments PROM into all planes; Hamstring release with extension stretch;                 PT Education - 03/12/16 0852    Education provided Yes   Education Details importance of regaining full knee extension    Person(s) Educated Patient   Methods Explanation;Demonstration;Handout   Comprehension Verbalized understanding;Returned demonstration;Verbal cues required          PT Short Term Goals - 03/12/16 0922    PT SHORT TERM GOAL #1   Title Patient will be I /w HEP for ROM and strengthening    Time 4   Period Weeks   Status On-going   PT SHORT TERM GOAL #2   Title Patient will increase gross right knee strength/ hip strength  to 4+/5   Baseline continues to work on stregthening    Period Weeks   Status On-going   PT SHORT TERM GOAL #3   Title Patient will increase right knee flexion to 90 degrees    Baseline 104   Time 4   Period Weeks   Status Achieved   PT SHORT TERM GOAL #4   Title Patient will demonstrate full active extension of the right knee.   Baseline 12   Time 4   Period Weeks   Status On-going           PT Long Term Goals - 03/12/16 [id number]    PT LONG TERM GOAL #1   Title Patient will ambulate 1 mile without self reported pain in order to walk hidog    Time 8   Period Weeks    Status On-going   PT LONG TERM GOAL #2   Title Patient will go up/down stairs with reciprocol gait pattern without  pain in order to improve household safety   Time 8   Period Weeks   Status On-going   PT LONG TERM GOAL #3   Title Patient will stand for 1 hour ewithout increased pain in order to perfrom IADL's    Time 8   Period Weeks   Status On-going   PT LONG TERM GOAL #4   Title Patient will demsotrate 115 degrees of knee flexion in order to get up and down out of a low chair.    Time 8   Period Weeks   Status On-going               Plan - 03/12/16 0915    Clinical Impression Statement Patient continues to lack extension. He has pain with passive extension strethcing. Range was measured at 12-104 degrees. He was encouraged to focus on extension stretching at home.    Rehab Potential Good   PT Frequency 2x / week   PT Duration 8 weeks   PT Treatment/Interventions ADLs/Self Care Home Management;Cryotherapy;Electrical Stimulation;Iontophoresis 4mg /ml Dexamethasone;Ultrasound;Functional mobility training;Gait training;Stair training;Neuromuscular re-education;Passive range of motion;Taping;Vasopneumatic Device;Scar mobilization;Manual techniques;Patient/family education;Therapeutic exercise   PT Next Visit Plan continue with stair training and gait traing. Focus on extension with manual therapy.    PT Home Exercise Plan Pre op exericises and Phase 1 TKR exericses, progress as pt tolerates. Hip abduction in sidelying added today   Consulted and Agree with Plan of Care Patient      Patient will benefit from skilled therapeutic intervention in order to improve the following deficits and impairments:  Abnormal gait, Decreased activity tolerance, Obesity, Postural dysfunction, Improper body mechanics, Difficulty walking, Decreased strength, Increased fascial restricitons, Decreased mobility, Decreased range of motion  Visit Diagnosis: Pain in right knee  Stiffness of right knee,  not elsewhere classified  Difficulty in walking, not elsewhere classified     Problem List Patient Active Problem List   Diagnosis Date Noted  . Fatigue 08/10/2015  . Erectile dysfunction 04/25/2015  . Anemia due to chronic blood loss 12/21/2014  . GI bleed 12/21/2014  . Chest pain at rest 09/30/2014  . Edema 07/13/2014  . Allergic rhinitis 03/17/2014  . PAF (paroxysmal atrial fibrillation) ([place]) 05/25/2013  . Cardiomegaly 12/12/2012  . Liver mass 12/12/2012  . Prostate cancer ([place]) 12/11/2012  . Hepatic cyst 12/11/2012  . Hematuria, gross 09/05/2012  . Knee osteoarthritis 09/05/2012  . OSA on CPAP 09/05/2012  . Snoring 09/07/2011  . Paresthesia of left arm 07/06/2011  . Gout 03/13/2011  . Well adult exam 03/03/2011  . Arthritis of knee 03/03/2011  . MENTAL CONFUSION 03/09/2010  . GAIT DISTURBANCE 03/09/2010  . HEAT STROKE 03/09/2010  . FOOT PAIN 11/08/2009  . CHEST PAIN UNSPECIFIED 08/19/2009  . COPD 07/08/2009  . TOBACCO USE, QUIT 07/08/2009  . RECTAL BLEEDING 12/07/2008  . PERSONAL HISTORY OF COLONIC POLYPS 12/07/2008  . Dyslipidemia 03/02/2008  . HEMATOCHEZIA 03/02/2008  . COUGH 03/02/2008  . HYPOKALEMIA 02/03/2008  . Acute sinusitis 02/03/2008  . SYNCOPE 02/03/2008  . DM (diabetes mellitus), type 2 with complications ([place]) 123XX123  . Pain in joint, shoulder region 11/27/2007  . ERECTILE DYSFUNCTION 10/30/2007  . Essential hypertension 10/30/2007  . OSTEOARTHRITIS 10/30/2007  . LOW BACK PAIN 10/30/2007    Carney Living 03/12/2016, 9:44 AM  [facility] [address] [place], [place], [address] Phone: [phone]   Fax:  [phone]  Name: JAVIER MONGILLO MRN: [id number] Date of Birth: September 08, 1950

## 2016-03-16 ENCOUNTER — Ambulatory Visit: Payer: Medicare Other | Admitting: Physical Therapy

## 2016-03-16 DIAGNOSIS — M25561 Pain in right knee: Secondary | ICD-10-CM

## 2016-03-16 DIAGNOSIS — M25661 Stiffness of right knee, not elsewhere classified: Secondary | ICD-10-CM

## 2016-03-16 DIAGNOSIS — R262 Difficulty in walking, not elsewhere classified: Secondary | ICD-10-CM

## 2016-03-16 NOTE — Therapy (Signed)
[facility] [place], [place], [address] Phone: [phone]   Fax:  [phone]  Physical Therapy Treatment  Patient Details  Name: [name] [name] MRN: [id number] Date of Birth: 1951/07/23 Referring Provider: Garlan Fillers MD   Encounter Date: 03/16/2016      PT End of Session - 03/16/16 0938    Visit Number 6   Number of Visits 13   Date for PT Re-Evaluation 04/23/16   Authorization Type VA   Authorization Time Period 03-08-16   PT Start Time 0930   PT Stop Time 1030   PT Time Calculation (min) 60 min   Activity Tolerance Patient tolerated treatment well   Behavior During Therapy [facility] for tasks assessed/performed      Past Medical History  Diagnosis Date  . Hypertension   . Osteoarthritis   . Diabetes mellitus type II   . Hyperlipidemia   . PAF (paroxysmal atrial fibrillation) [facility])     cardiologist--  dr hochrein  (episode prior to liver surgery at [facility] 2014 w/ post-op RVR)  . First degree heart block   . OSA on CPAP   . History of colon polyps   . History of prostate cancer current PSA per pt 0.13    dx March 2014---  s/p radiactive prostate seed implants and intraop radiotherapy in [place], [place]  . ED (erectile dysfunction) of organic origin   . Bilateral renal cysts     benign  . DDD (degenerative disc disease), lumbar   . COPD (chronic obstructive pulmonary disease) ([place])      PFT 2010 due to previous 20y h/o smoking  . Wears glasses   . Sleep apnea     wears cpap  . Allergy   . Rectal cancer ([place])     01-25-2015----per pathology --  complete rectal polypectomy-- showed  invasive carcinoma rising in tubular adnemoa high grade hyperplasia--  per dr Ardis Hughs per MRI  no other area seen -- will have colonoscopy done in 3 months  . Prostate cancer ([place]) 11-11-2012    s/p chemo, radiation     Past Surgical History  Procedure Laterality Date  . Total knee arthroplasty Left 10- 2013  .  Vasectomy  1980's w/ gen. anes.  . Eus N/A 02/10/2015    Procedure: LOWER ENDOSCOPIC ULTRASOUND (EUS);  Surgeon: Milus Banister, MD;  Location: [facility];  Service: Endoscopy;  Laterality: N/A;  . Radioactive prostate seed implants  May 2014  in [place], [place]  .  right lobe hepatectomy  06-12-2013    [facility]    benign mass  . Cardiovascular stress test  10-01-2014   dr hochrein    normal perfusion study, no ischemia,  normal LV function and wall motion, ef 70%  . Transthoracic echocardiogram  12-26-2012    mild focal basal septal hypertrophy,  grade II diastolic dysfunction,  ef 55-60%,  mild LAE,  trivial MR and TR  . Lumbar fusion  2001  . Knee arthroscopy Bilateral 1990's  . Penile prosthesis implant N/A 04/25/2015    Procedure: PENILE PROSTHESIS THREE PIECE INFLATABLE( COLOPLAST) SCROTAL APPROACH;  Surgeon: Kathie Rhodes, MD;  Location: [facility];  Service: Urology;  Laterality: N/A;  . Colonoscopy    . Polypectomy      There were no vitals filed for this visit.      Subjective Assessment - 03/16/16 0936    Subjective Patient reports his knee has been a little tender since the  last visit. He continues to use his walker for ambulation.   Pertinent History Left TKR October 2013, DM2, Prostate CA remission; low back surgery    Limitations Standing;Lifting;Walking   How long can you sit comfortably? 1 hour    How long can you stand comfortably? > 5 min    How long can you walk comfortably? ambulating limited household distances    Diagnostic tests nothing post op    Patient Stated Goals Patient would like to walk the dog and go up and down the steps. Participate in activity at church. Get down on hands and knees to [place] [employer].    Currently in Pain? No/denies   Pain Score 1    Pain Location Knee   Pain Orientation Right   Pain Descriptors / Indicators Discomfort;Tender   Pain Type Surgical pain   Pain Onset More than a month ago   Pain Frequency Constant    Aggravating Factors  walking, stairs   Pain Relieving Factors rest, massage    Effect of Pain on Daily Activities difficulty ambulating community distances                         [facility] Adult PT Treatment/Exercise - 03/16/16 0001    Ambulation/Gait   Gait Comments Pt amb 500 feet with straight cane with step through gait pattern, with increased time for amb with decreased terminal knee extension on R and instrctions for heel to toe gait pattern bilateraly. Cueing for right toe out.    Exercises   Exercises Other Exercises  NuStep level 5, 7 minutes   Other Exercises  Nustep level 5, 7 minutes   Knee/Hip Exercises: Standing   Heel Raises Limitations x30   Hip Flexion Limitations standing march x20   Terminal Knee Extension Limitations 3x10   Abduction Limitations x20   Extension Limitations x20   Forward Step Up Limitations 6"2x10    Functional Squat Limitations 2x10    Other Standing Knee Exercises mini squats x 30   Knee/Hip Exercises: Supine   Short Arc Quad Sets Limitations x30   Straight Leg Raises Limitations 3x10   Modalities   Modalities Cryotherapy;Vasopneumatic   Cryotherapy   Cryotherapy Location --   Vasopneumatic   Number Minutes Vasopneumatic  15 minutes   Vasopnuematic Location  Knee   Vasopneumatic Pressure Low   Vasopneumatic Temperature  32 degrees    Manual Therapy   Manual therapy comments PROM into all planes; Hamstring release with extension stretch;                   PT Short Term Goals - 03/16/16 1001    PT SHORT TERM GOAL #1   Title Patient will be I /w HEP for ROM and strengthening    Baseline He is perfroming exercises at home   Time 4   Period Weeks   Status Achieved   PT SHORT TERM GOAL #2   Title Patient will increase gross right knee strength/ hip strength  to 4+/5   Baseline continues to work on stregthening strength improving    Time 4   Period Weeks   Status On-going   PT SHORT TERM GOAL #3   Title Patient  will increase right knee flexion to 90 degrees    Time 4   Period Weeks   Status Achieved   PT SHORT TERM GOAL #4   Title Patient will demonstrate full active extension of the right knee.   Baseline  12   Time 4   Period Weeks   Status On-going           PT Long Term Goals - 03/12/16 [id number]    PT LONG TERM GOAL #1   Title Patient will ambulate 1 mile without self reported pain in order to walk hidog    Time 8   Period Weeks   Status On-going   PT LONG TERM GOAL #2   Title Patient will go up/down stairs with reciprocol gait pattern without pain in order to improve household safety   Time 8   Period Weeks   Status On-going   PT LONG TERM GOAL #3   Title Patient will stand for 1 hour ewithout increased pain in order to perfrom IADL's    Time 8   Period Weeks   Status On-going   PT LONG TERM GOAL #4   Title Patient will demsotrate 115 degrees of knee flexion in order to get up and down out of a low chair.    Time 8   Period Weeks   Status On-going               Plan - 03/16/16 0958    Clinical Impression Statement Patient continues to be tender with extension. He reports he has been sleeping with a pillow under his knee at night. The patient was strongly advised not to do this. He seems to be loosing some extension despite stretching. He is tolerating exercises well.  The patient is able to complete all exercises and even does extra reps on most. Added vasopnematic device to treatment to reduce swelling and inflammation.     Rehab Potential Good   PT Frequency 2x / week   PT Duration 8 weeks   PT Treatment/Interventions ADLs/Self Care Home Management;Cryotherapy;Electrical Stimulation;Iontophoresis 4mg /ml Dexamethasone;Ultrasound;Functional mobility training;Gait training;Stair training;Neuromuscular re-education;Passive range of motion;Taping;Vasopneumatic Device;Scar mobilization;Manual techniques;Patient/family education;Therapeutic exercise   PT Next Visit Plan  continue with stair training and gait traing. Focus on extension with manual therapy.    PT Home Exercise Plan Pre op exericises and Phase 1 TKR exericses, progress as pt tolerates. Hip abduction in sidelying added today   Consulted and Agree with Plan of Care Patient      Patient will benefit from skilled therapeutic intervention in order to improve the following deficits and impairments:  Abnormal gait, Decreased activity tolerance, Obesity, Postural dysfunction, Improper body mechanics, Difficulty walking, Decreased strength, Increased fascial restricitons, Decreased mobility, Decreased range of motion  Visit Diagnosis: Pain in right knee  Stiffness of right knee, not elsewhere classified  Difficulty in walking, not elsewhere classified     Problem List Patient Active Problem List   Diagnosis Date Noted  . Fatigue 08/10/2015  . Erectile dysfunction 04/25/2015  . Anemia due to chronic blood loss 12/21/2014  . GI bleed 12/21/2014  . Chest pain at rest 09/30/2014  . Edema 07/13/2014  . Allergic rhinitis 03/17/2014  . PAF (paroxysmal atrial fibrillation) ([place]) 05/25/2013  . Cardiomegaly 12/12/2012  . Liver mass 12/12/2012  . Prostate cancer ([place]) 12/11/2012  . Hepatic cyst 12/11/2012  . Hematuria, gross 09/05/2012  . Knee osteoarthritis 09/05/2012  . OSA on CPAP 09/05/2012  . Snoring 09/07/2011  . Paresthesia of left arm 07/06/2011  . Gout 03/13/2011  . Well adult exam 03/03/2011  . Arthritis of knee 03/03/2011  . MENTAL CONFUSION 03/09/2010  . GAIT DISTURBANCE 03/09/2010  . HEAT STROKE 03/09/2010  . FOOT PAIN 11/08/2009  . CHEST PAIN UNSPECIFIED 08/19/2009  .  COPD 07/08/2009  . TOBACCO USE, QUIT 07/08/2009  . RECTAL BLEEDING 12/07/2008  . PERSONAL HISTORY OF COLONIC POLYPS 12/07/2008  . Dyslipidemia 03/02/2008  . HEMATOCHEZIA 03/02/2008  . COUGH 03/02/2008  . HYPOKALEMIA 02/03/2008  . Acute sinusitis 02/03/2008  . SYNCOPE 02/03/2008  . DM (diabetes mellitus),  type 2 with complications ([place]) 123XX123  . Pain in joint, shoulder region 11/27/2007  . ERECTILE DYSFUNCTION 10/30/2007  . Essential hypertension 10/30/2007  . OSTEOARTHRITIS 10/30/2007  . LOW BACK PAIN 10/30/2007    Carney Living  PT DPT   03/16/2016, 11:10 AM  [facility] [address] [place], [place], [address] Phone: [phone]   Fax:  [phone]  Name: CHESTON ROSENQUIST MRN: [id number] Date of Birth: 05/23/1951

## 2016-03-19 ENCOUNTER — Ambulatory Visit: Payer: Medicare Other | Admitting: Physical Therapy

## 2016-03-19 DIAGNOSIS — M25561 Pain in right knee: Secondary | ICD-10-CM | POA: Diagnosis not present

## 2016-03-19 DIAGNOSIS — R262 Difficulty in walking, not elsewhere classified: Secondary | ICD-10-CM

## 2016-03-19 DIAGNOSIS — M25661 Stiffness of right knee, not elsewhere classified: Secondary | ICD-10-CM

## 2016-03-19 NOTE — Therapy (Signed)
[place], [place], [address] Phone: [phone]   Fax:  [phone]  Physical Therapy Treatment  Patient Details  Name: [name] [name] MRN: [id number] Date of Birth: 1950-09-18 Referring Provider: Garlan Fillers MD   Encounter Date: 03/19/2016      PT End of Session - 03/19/16 0952    Visit Number 7   Number of Visits 13   Date for PT Re-Evaluation 04/23/16   Authorization Type VA   Authorization Time Period 03-08-16   PT Start Time 0934   PT Stop Time 1025   PT Time Calculation (min) 51 min   Activity Tolerance Patient tolerated treatment well   Behavior During Therapy [facility] for tasks assessed/performed      Past Medical History  Diagnosis Date  . Hypertension   . Osteoarthritis   . Diabetes mellitus type II   . Hyperlipidemia   . PAF (paroxysmal atrial fibrillation) [facility])     cardiologist--  dr hochrein  (episode prior to liver surgery at [facility] 2014 w/ post-op RVR)  . First degree heart block   . OSA on CPAP   . History of colon polyps   . History of prostate cancer current PSA per pt 0.13    dx March 2014---  s/p radiactive prostate seed implants and intraop radiotherapy in [place], [place]  . ED (erectile dysfunction) of organic origin   . Bilateral renal cysts     benign  . DDD (degenerative disc disease), lumbar   . COPD (chronic obstructive pulmonary disease) ([place])      PFT 2010 due to previous 20y h/o smoking  . Wears glasses   . Sleep apnea     wears cpap  . Allergy   . Rectal cancer ([place])     01-25-2015----per pathology --  complete rectal polypectomy-- showed  invasive carcinoma rising in tubular adnemoa high grade hyperplasia--  per dr Ardis Hughs per MRI  no other area seen -- will have colonoscopy done in 3 months  . Prostate cancer ([place]) 11-11-2012    s/p chemo, radiation     Past Surgical History  Procedure Laterality Date  . Total knee arthroplasty Left 10- 2013  .  Vasectomy  1980's w/ gen. anes.  . Eus N/A 02/10/2015    Procedure: LOWER ENDOSCOPIC ULTRASOUND (EUS);  Surgeon: Milus Banister, MD;  Location: [facility];  Service: Endoscopy;  Laterality: N/A;  . Radioactive prostate seed implants  May 2014  in [place], [place]  .  right lobe hepatectomy  06-12-2013    [facility] [place]    benign mass  . Cardiovascular stress test  10-01-2014   dr hochrein    normal perfusion study, no ischemia,  normal LV function and wall motion, ef 70%  . Transthoracic echocardiogram  12-26-2012    mild focal basal septal hypertrophy,  grade II diastolic dysfunction,  ef 55-60%,  mild LAE,  trivial MR and TR  . Lumbar fusion  2001  . Knee arthroscopy Bilateral 1990's  . Penile prosthesis implant N/A 04/25/2015    Procedure: PENILE PROSTHESIS THREE PIECE INFLATABLE( COLOPLAST) SCROTAL APPROACH;  Surgeon: Kathie Rhodes, MD;  Location: [facility];  Service: Urology;  Laterality: N/A;  . Colonoscopy    . Polypectomy      There were no vitals filed for this visit.      Subjective Assessment - 03/19/16 0948    Subjective Patient enters the clinic with his cane today. he reports he  has no been using the walker and he has been feeling steady. He reports no real pain just some tenderness on the outside of the knee. He has been doing his exercises. He has not been sleeping with a pillow under his leg.    Pertinent History Left TKR October 2013, DM2, Prostate CA remission; low back surgery    Limitations Standing;Lifting;Walking   How long can you sit comfortably? 1 hour    How long can you stand comfortably? > 5 min    How long can you walk comfortably? ambulating limited household distances    Diagnostic tests nothing post op    Patient Stated Goals Patient would like to walk the dog and go up and down the steps. Participate in activity at church. Get down on hands and knees to [place] [employer].    Currently in Pain? No/denies   Aggravating Factors  walking, stairs    Pain  Relieving Factors rest, massage    Effect of Pain on Daily Activities difficulty ambulating    Multiple Pain Sites No                         [facility] Adult PT Treatment/Exercise - 03/19/16 0001    Knee/Hip Exercises: Standing   Heel Raises Limitations x30   Hip Flexion Limitations standing march x20   Terminal Knee Extension Limitations 3x10   Lateral Step Up Limitations 6"2x10    Forward Step Up Limitations 6"2x10    Functional Squat Limitations 2x10    Knee/Hip Exercises: Seated   Long Arc Quad Limitations yellow x30    Hamstring Limitations yellow 3x10    Knee/Hip Exercises: Supine   Straight Leg Raises Limitations 3x10   Modalities   Modalities Cryotherapy;Vasopneumatic   Cryotherapy   Number Minutes Cryotherapy 10 Minutes   Cryotherapy Location Knee   Manual Therapy   Manual therapy comments PROM into all planes; Hamstring release with extension stretch;                 PT Education - 03/19/16 0952    Education provided Yes   Education Details continue to work on extension stretching, sympto m mangement with added resistance.    Person(s) Educated Patient   Methods Explanation   Comprehension Verbalized understanding;Returned demonstration          PT Short Term Goals - 03/19/16 1025    PT SHORT TERM GOAL #1   Title Patient will be I /w HEP for ROM and strengthening    Baseline He is perfroming exercises at home   Time 4   Period Weeks   Status Achieved   PT SHORT TERM GOAL #2   Title Patient will increase gross right knee strength/ hip strength  to 4+/5   Baseline continues to work on stregthening strength improving    Time 4   Period Weeks   Status On-going   PT SHORT TERM GOAL #3   Title Patient will increase right knee flexion to 90 degrees    Baseline 104   Time 4   Period Weeks   Status Achieved   PT SHORT TERM GOAL #4   Title Patient will demonstrate full active extension of the right knee.   Baseline 7   Time 4   Period  Weeks   Status On-going           PT Long Term Goals - 03/12/16 [id number]    PT LONG TERM GOAL #1   Title  Patient will ambulate 1 mile without self reported pain in order to walk hidog    Time 8   Period Weeks   Status On-going   PT LONG TERM GOAL #2   Title Patient will go up/down stairs with reciprocol gait pattern without pain in order to improve household safety   Time 8   Period Weeks   Status On-going   PT LONG TERM GOAL #3   Title Patient will stand for 1 hour ewithout increased pain in order to perfrom IADL's    Time 8   Period Weeks   Status On-going   PT LONG TERM GOAL #4   Title Patient will demsotrate 115 degrees of knee flexion in order to get up and down out of a low chair.    Time 8   Period Weeks   Status On-going               Plan - 03/19/16 1005    Clinical Impression Statement Patient tolerated stretching much better today. His extension improved. PROM measured at 7-109 degrees today. Therapy added resistance to LAQ and leg presses. He had no significant increase in pain. No new goals reached today but he is progressing well otwards all loing and short term goals.    Rehab Potential Good   PT Frequency 2x / week   PT Duration 8 weeks   PT Treatment/Interventions ADLs/Self Care Home Management;Cryotherapy;Electrical Stimulation;Iontophoresis 4mg /ml Dexamethasone;Ultrasound;Functional mobility training;Gait training;Stair training;Neuromuscular re-education;Passive range of motion;Taping;Vasopneumatic Device;Scar mobilization;Manual techniques;Patient/family education;Therapeutic exercise   PT Next Visit Plan continue with stair training and gait traing. Focus on extension with manual therapy.    PT Home Exercise Plan Pre op exericises and Phase 1 TKR exericses, progress as pt tolerates. Hip abduction in sidelying added today   Consulted and Agree with Plan of Care Patient      Patient will benefit from skilled therapeutic intervention in order to  improve the following deficits and impairments:  Abnormal gait, Decreased activity tolerance, Obesity, Postural dysfunction, Improper body mechanics, Difficulty walking, Decreased strength, Increased fascial restricitons, Decreased mobility, Decreased range of motion  Visit Diagnosis: Stiffness of right knee, not elsewhere classified  Difficulty in walking, not elsewhere classified  Pain in right knee     Problem List Patient Active Problem List   Diagnosis Date Noted  . Fatigue 08/10/2015  . Erectile dysfunction 04/25/2015  . Anemia due to chronic blood loss 12/21/2014  . GI bleed 12/21/2014  . Chest pain at rest 09/30/2014  . Edema 07/13/2014  . Allergic rhinitis 03/17/2014  . PAF (paroxysmal atrial fibrillation) ([place]) 05/25/2013  . Cardiomegaly 12/12/2012  . Liver mass 12/12/2012  . Prostate cancer ([place]) 12/11/2012  . Hepatic cyst 12/11/2012  . Hematuria, gross 09/05/2012  . Knee osteoarthritis 09/05/2012  . OSA on CPAP 09/05/2012  . Snoring 09/07/2011  . Paresthesia of left arm 07/06/2011  . Gout 03/13/2011  . Well adult exam 03/03/2011  . Arthritis of knee 03/03/2011  . MENTAL CONFUSION 03/09/2010  . GAIT DISTURBANCE 03/09/2010  . HEAT STROKE 03/09/2010  . FOOT PAIN 11/08/2009  . CHEST PAIN UNSPECIFIED 08/19/2009  . COPD 07/08/2009  . TOBACCO USE, QUIT 07/08/2009  . RECTAL BLEEDING 12/07/2008  . PERSONAL HISTORY OF COLONIC POLYPS 12/07/2008  . Dyslipidemia 03/02/2008  . HEMATOCHEZIA 03/02/2008  . COUGH 03/02/2008  . HYPOKALEMIA 02/03/2008  . Acute sinusitis 02/03/2008  . SYNCOPE 02/03/2008  . DM (diabetes mellitus), type 2 with complications ([place]) 123XX123  . Pain in joint, shoulder region  11/27/2007  . ERECTILE DYSFUNCTION 10/30/2007  . Essential hypertension 10/30/2007  . OSTEOARTHRITIS 10/30/2007  . LOW BACK PAIN 10/30/2007    Carney Living PT DPT  03/19/2016, 1:26 PM  [facility] [address] [place], [place], [address] Phone: [phone]   Fax:  [phone]  Name: [name] [name] MRN: [id number] Date of Birth: 05-Nov-1950

## 2016-03-23 ENCOUNTER — Ambulatory Visit: Payer: Medicare Other | Admitting: Physical Therapy

## 2016-03-23 DIAGNOSIS — M25561 Pain in right knee: Secondary | ICD-10-CM

## 2016-03-23 DIAGNOSIS — M25661 Stiffness of right knee, not elsewhere classified: Secondary | ICD-10-CM

## 2016-03-23 DIAGNOSIS — R262 Difficulty in walking, not elsewhere classified: Secondary | ICD-10-CM

## 2016-03-23 NOTE — Therapy (Signed)
[place], [place], [address] Phone: [phone]   Fax:  [phone]  Physical Therapy Treatment  Patient Details  Name: [name] [name] MRN: [id number] Date of Birth: March 01, 1951 Referring Provider: Garlan Fillers MD   Encounter Date: 03/23/2016      PT End of Session - 03/23/16 1009    Visit Number 9   Number of Visits 13   Date for PT Re-Evaluation 04/23/16   Authorization Type VA   Authorization Time Period 03-08-16   PT Start Time 0930   PT Stop Time 1020   PT Time Calculation (min) 50 min   Activity Tolerance Patient tolerated treatment well   Behavior During Therapy [facility] for tasks assessed/performed      Past Medical History  Diagnosis Date  . Hypertension   . Osteoarthritis   . Diabetes mellitus type II   . Hyperlipidemia   . PAF (paroxysmal atrial fibrillation) [facility])     cardiologist--  dr hochrein  (episode prior to liver surgery at [facility] 2014 w/ post-op RVR)  . First degree heart block   . OSA on CPAP   . History of colon polyps   . History of prostate cancer current PSA per pt 0.13    dx March 2014---  s/p radiactive prostate seed implants and intraop radiotherapy in [place], [place]  . ED (erectile dysfunction) of organic origin   . Bilateral renal cysts     benign  . DDD (degenerative disc disease), lumbar   . COPD (chronic obstructive pulmonary disease) ([place])      PFT 2010 due to previous 20y h/o smoking  . Wears glasses   . Sleep apnea     wears cpap  . Allergy   . Rectal cancer ([place])     01-25-2015----per pathology --  complete rectal polypectomy-- showed  invasive carcinoma rising in tubular adnemoa high grade hyperplasia--  per dr Ardis Hughs per MRI  no other area seen -- will have colonoscopy done in 3 months  . Prostate cancer ([place]) 11-11-2012    s/p chemo, radiation     Past Surgical History  Procedure Laterality Date  . Total knee arthroplasty Left 10- 2013  .  Vasectomy  1980's w/ gen. anes.  . Eus N/A 02/10/2015    Procedure: LOWER ENDOSCOPIC ULTRASOUND (EUS);  Surgeon: Milus Banister, MD;  Location[facility];  Service: Endoscopy;  Laterality: N/A;  . Radioactive prostate seed implants  May 2014  in [place], [place]  .  right lobe hepatectomy  06-12-2013    [facility] [place]    benign mass  . Cardiovascular stress test  10-01-2014   dr hochrein    normal perfusion study, no ischemia,  normal LV function and wall motion, ef 70%  . Transthoracic echocardiogram  12-26-2012    mild focal basal septal hypertrophy,  grade II diastolic dysfunction,  ef 55-60%,  mild LAE,  trivial MR and TR  . Lumbar fusion  2001  . Knee arthroscopy Bilateral 1990's  . Penile prosthesis implant N/A 04/25/2015    Procedure: PENILE PROSTHESIS THREE PIECE INFLATABLE( COLOPLAST) SCROTAL APPROACH;  Surgeon: Kathie Rhodes, MD;  Location: [facility];  Service: Urology;  Laterality: N/A;  . Colonoscopy    . Polypectomy      There were no vitals filed for this visit.      Subjective Assessment - 03/23/16 0951    Subjective Patient reports no pain today. He reports he is still stiff  but it is improiving. He was able to go up and down his steps at his house.    Pertinent History Left TKR October 2013, DM2, Prostate CA remission; low back surgery    Limitations Standing;Lifting;Walking   How long can you sit comfortably? 1 hour    How long can you stand comfortably? > 5 min    How long can you walk comfortably? ambulating limited household distances    Diagnostic tests nothing post op    Patient Stated Goals Patient would like to walk the dog and go up and down the steps. Participate in activity at church. Get down on hands and knees to [place] [employer].    Currently in Pain? No/denies                         [facility] Adult PT Treatment/Exercise - 03/23/16 0001    Ambulation/Gait   Gait Comments ambulation in the clinic with no device but increased antalgic gait     Exercises   Exercises Other Exercises  NuStep level 5, 7 minutes   Other Exercises  Nustep level 5, 7 minutes   Knee/Hip Exercises: Machines for Strengthening   Total Gym Leg Press 30x 3pl    Knee/Hip Exercises: Standing   Heel Raises Limitations x30   Hip Flexion Limitations standing march x20   Terminal Knee Extension Limitations 3x10   Abduction Limitations x20   Extension Limitations x20   Lateral Step Up Limitations 8"2x10    Forward Step Up Limitations 8"2x10    Functional Squat Limitations 2x10    Knee/Hip Exercises: Seated   Long Arc Quad Limitations yellow x30    Hamstring Limitations yellow 3x10    Knee/Hip Exercises: Supine   Straight Leg Raises Limitations 3x10 2lb    Modalities   Modalities Cryotherapy;Vasopneumatic   Cryotherapy   Number Minutes Cryotherapy 10 Minutes   Cryotherapy Location Knee   Manual Therapy   Manual therapy comments PROM into all planes; Hamstring release with extension stretch;                   PT Short Term Goals - 03/19/16 1025    PT SHORT TERM GOAL #1   Title Patient will be I /w HEP for ROM and strengthening    Baseline He is perfroming exercises at home   Time 4   Period Weeks   Status Achieved   PT SHORT TERM GOAL #2   Title Patient will increase gross right knee strength/ hip strength  to 4+/5   Baseline continues to work on stregthening strength improving    Time 4   Period Weeks   Status On-going   PT SHORT TERM GOAL #3   Title Patient will increase right knee flexion to 90 degrees    Baseline 104   Time 4   Period Weeks   Status Achieved   PT SHORT TERM GOAL #4   Title Patient will demonstrate full active extension of the right knee.   Baseline 7   Time 4   Period Weeks   Status On-going           PT Long Term Goals - 03/12/16 [id number]    PT LONG TERM GOAL #1   Title Patient will ambulate 1 mile without self reported pain in order to walk hidog    Time 8   Period Weeks   Status On-going   PT  LONG TERM GOAL #2   Title Patient  will go up/down stairs with reciprocol gait pattern without pain in order to improve household safety   Time 8   Period Weeks   Status On-going   PT LONG TERM GOAL #3   Title Patient will stand for 1 hour ewithout increased pain in order to perfrom IADL's    Time 8   Period Weeks   Status On-going   PT LONG TERM GOAL #4   Title Patient will demsotrate 115 degrees of knee flexion in order to get up and down out of a low chair.    Time 8   Period Weeks   Status On-going               Plan - 03/23/16 1030    Clinical Impression Statement Patient continues to be very motivated. He was able to complete all exercises without cuing today. his only real deficit at this point is his extension range. Range was measured from 7-105 degrees. He continues to have some posterior swelling in his knee.    Rehab Potential Good   PT Frequency 2x / week   PT Duration 8 weeks   PT Treatment/Interventions ADLs/Self Care Home Management;Cryotherapy;Electrical Stimulation;Iontophoresis 4mg /ml Dexamethasone;Ultrasound;Functional mobility training;Gait training;Stair training;Neuromuscular re-education;Passive range of motion;Taping;Vasopneumatic Device;Scar mobilization;Manual techniques;Patient/family education;Therapeutic exercise   PT Next Visit Plan continue with stair training and gait traing. Focus on extension with manual therapy. Re-assess progress next visit: FOTO.    PT Home Exercise Plan --   Consulted and Agree with Plan of Care Patient      Patient will benefit from skilled therapeutic intervention in order to improve the following deficits and impairments:  Abnormal gait, Decreased activity tolerance, Obesity, Postural dysfunction, Improper body mechanics, Difficulty walking, Decreased strength, Increased fascial restricitons, Decreased mobility, Decreased range of motion  Visit Diagnosis: Stiffness of right knee, not elsewhere classified  Pain in  right knee  Difficulty in walking, not elsewhere classified     Problem List Patient Active Problem List   Diagnosis Date Noted  . Fatigue 08/10/2015  . Erectile dysfunction 04/25/2015  . Anemia due to chronic blood loss 12/21/2014  . GI bleed 12/21/2014  . Chest pain at rest 09/30/2014  . Edema 07/13/2014  . Allergic rhinitis 03/17/2014  . PAF (paroxysmal atrial fibrillation) ([place]) 05/25/2013  . Cardiomegaly 12/12/2012  . Liver mass 12/12/2012  . Prostate cancer ([place]) 12/11/2012  . Hepatic cyst 12/11/2012  . Hematuria, gross 09/05/2012  . Knee osteoarthritis 09/05/2012  . OSA on CPAP 09/05/2012  . Snoring 09/07/2011  . Paresthesia of left arm 07/06/2011  . Gout 03/13/2011  . Well adult exam 03/03/2011  . Arthritis of knee 03/03/2011  . MENTAL CONFUSION 03/09/2010  . GAIT DISTURBANCE 03/09/2010  . HEAT STROKE 03/09/2010  . FOOT PAIN 11/08/2009  . CHEST PAIN UNSPECIFIED 08/19/2009  . COPD 07/08/2009  . TOBACCO USE, QUIT 07/08/2009  . RECTAL BLEEDING 12/07/2008  . PERSONAL HISTORY OF COLONIC POLYPS 12/07/2008  . Dyslipidemia 03/02/2008  . HEMATOCHEZIA 03/02/2008  . COUGH 03/02/2008  . HYPOKALEMIA 02/03/2008  . Acute sinusitis 02/03/2008  . SYNCOPE 02/03/2008  . DM (diabetes mellitus), type 2 with complications ([place]) 123XX123  . Pain in joint, shoulder region 11/27/2007  . ERECTILE DYSFUNCTION 10/30/2007  . Essential hypertension 10/30/2007  . OSTEOARTHRITIS 10/30/2007  . LOW BACK PAIN 10/30/2007    Carney Living PT DPT  03/23/2016, 10:49 AM  [facility] [address] [place], [place], [address] Phone: [phone]   Fax:  [phone]  Name: [name] [name] MRN: [id number] Date of Birth: 1950-12-16

## 2016-03-26 ENCOUNTER — Ambulatory Visit: Payer: Medicare Other | Admitting: Physical Therapy

## 2016-03-26 DIAGNOSIS — M25561 Pain in right knee: Secondary | ICD-10-CM | POA: Diagnosis not present

## 2016-03-26 DIAGNOSIS — R262 Difficulty in walking, not elsewhere classified: Secondary | ICD-10-CM

## 2016-03-26 DIAGNOSIS — M25661 Stiffness of right knee, not elsewhere classified: Secondary | ICD-10-CM

## 2016-03-26 NOTE — Therapy (Signed)
[place], [place], [address] Phone: [phone]   Fax:  [phone]  Physical Therapy Treatment  Patient Details  Name: [name] [name] MRN: [id number] Date of Birth: Sep 22, 1950 Referring Provider: Garlan Fillers MD   Encounter Date: 03/26/2016      PT End of Session - 03/26/16 1025    Visit Number 9   Number of Visits 13   Date for PT Re-Evaluation 04/23/16   Authorization Type VA   Authorization Time Period 04/08/16   PT Start Time 1000   PT Stop Time 1055   PT Time Calculation (min) 55 min   Activity Tolerance Patient tolerated treatment well   Behavior During Therapy [facility] for tasks assessed/performed      Past Medical History:  Diagnosis Date  . Allergy   . Bilateral renal cysts    benign  . COPD (chronic obstructive pulmonary disease) ([place])     PFT 2010 due to previous 20y h/o smoking  . DDD (degenerative disc disease), lumbar   . Diabetes mellitus type II   . ED (erectile dysfunction) of organic origin   . First degree heart block   . History of colon polyps   . History of prostate cancer current PSA per pt 0.13   dx March 2014---  s/p radiactive prostate seed implants and intraop radiotherapy in [place], [place]  . Hyperlipidemia   . Hypertension   . OSA on CPAP   . Osteoarthritis   . PAF (paroxysmal atrial fibrillation) [facility])    cardiologist--  dr hochrein  (episode prior to liver surgery at [facility] 2014 w/ post-op RVR)  . Prostate cancer ([place]) 11-11-2012   s/p chemo, radiation   . Rectal cancer ([place])    01-25-2015----per pathology --  complete rectal polypectomy-- showed  invasive carcinoma rising in tubular adnemoa high grade hyperplasia--  per dr Ardis Hughs per MRI  no other area seen -- will have colonoscopy done in 3 months  . Sleep apnea    wears cpap  . Wears glasses     Past Surgical History:  Procedure Laterality Date  .  RIGHT LOBE HEPATECTOMY  06-12-2013    [facility]    benign mass  . CARDIOVASCULAR STRESS TEST  10-01-2014   dr hochrein   normal perfusion study, no ischemia,  normal LV function and wall motion, ef 70%  . COLONOSCOPY    . EUS N/A 02/10/2015   Procedure: LOWER ENDOSCOPIC ULTRASOUND (EUS);  Surgeon: Milus Banister, MD;  Location: [facility];  Service: Endoscopy;  Laterality: N/A;  . KNEE ARTHROSCOPY Bilateral 1990's  . LUMBAR FUSION  2001  . PENILE PROSTHESIS IMPLANT N/A 04/25/2015   Procedure: PENILE PROSTHESIS THREE PIECE INFLATABLE( COLOPLAST) SCROTAL APPROACH;  Surgeon: Kathie Rhodes, MD;  Location: [facility];  Service: Urology;  Laterality: N/A;  . POLYPECTOMY    . RADIOACTIVE PROSTATE SEED IMPLANTS  May 2014  in [place], [place]  . TOTAL KNEE ARTHROPLASTY Left 10- 2013  . TRANSTHORACIC ECHOCARDIOGRAM  12-26-2012   mild focal basal septal hypertrophy,  grade II diastolic dysfunction,  ef 55-60%,  mild LAE,  trivial MR and TR  . VASECTOMY  1980's w/ gen. anes.    There were no vitals filed for this visit.      Subjective Assessment - 03/26/16 1019    Subjective Patient reports no pain over the weekedn. He appeared stiff after standing up and walking into the clinic byut he reports no pain.  Pertinent History Left TKR October 2013, DM2, Prostate CA remission; low back surgery    Limitations Standing;Lifting;Walking   How long can you sit comfortably? 1 hour    How long can you stand comfortably? > 5 min    How long can you walk comfortably? ambulating limited household distances    Diagnostic tests nothing post op    Patient Stated Goals Patient would like to walk the dog and go up and down the steps. Participate in activity at church. Get down on hands and knees to [place] [employer].    Currently in Pain? No/denies                         [facility] Adult PT Treatment/Exercise - 03/26/16 0001      Ambulation/Gait   Gait Comments ambulation in the clinic with no device but increased antalgic gait       Exercises   Exercises Other Exercises  NuStep level 5, 7 minutes   Other Exercises  Nustep level 5, 7 minutes     Knee/Hip Exercises: Machines for Strengthening   Total Gym Leg Press 30x 3pl      Knee/Hip Exercises: Standing   Heel Raises Limitations x30   Hip Flexion Limitations standing march x20   Terminal Knee Extension Limitations 3x10   Abduction Limitations x20   Extension Limitations x20   Lateral Step Up Limitations 8"2x10    Forward Step Up Limitations 8"2x10    Functional Squat Limitations 2x10      Knee/Hip Exercises: Seated   Long Arc Quad Limitations 2.5 lbs x30    Hamstring Limitations red 3x10      Knee/Hip Exercises: Supine   Straight Leg Raises Limitations 3x10 2lb      Modalities   Modalities Cryotherapy;Vasopneumatic     Cryotherapy   Cryotherapy Location Knee     Vasopneumatic   Number Minutes Vasopneumatic  15 minutes   Vasopnuematic Location  Knee   Vasopneumatic Pressure Low   Vasopneumatic Temperature  32 degrees      Manual Therapy   Manual therapy comments PROM into all planes; Hamstring release with extension stretch;Grade II and grade III PA and AP mobs to improve flexion and extenson.                  PT Education - 03/26/16 1024    Education provided Yes   Education Details improtance of continuing extension stretching    Person(s) Educated Patient   Methods Explanation   Comprehension Verbalized understanding;Returned demonstration          PT Short Term Goals - 03/26/16 1038      PT SHORT TERM GOAL #1   Title Patient will be I /w HEP for ROM and strengthening    Baseline He is perfroming exercises at home   Time 4   Period Weeks   Status Achieved     PT SHORT TERM GOAL #2   Title Patient will increase gross right knee strength/ hip strength  to 4+/5   Baseline continues to work on stregthening strength improving    Time 4   Period Weeks   Status On-going     PT SHORT TERM GOAL #3   Title Patient will  increase right knee flexion to 90 degrees    Baseline 108   Time 4   Period Weeks   Status Achieved     PT SHORT TERM GOAL #4   Title Patient will demonstrate  full active extension of the right knee.   Time 4   Period Weeks   Status On-going           PT Long Term Goals - 03/12/16 [id number]      PT LONG TERM GOAL #1   Title Patient will ambulate 1 mile without self reported pain in order to walk hidog    Time 8   Period Weeks   Status On-going     PT LONG TERM GOAL #2   Title Patient will go up/down stairs with reciprocol gait pattern without pain in order to improve household safety   Time 8   Period Weeks   Status On-going     PT LONG TERM GOAL #3   Title Patient will stand for 1 hour ewithout increased pain in order to perfrom IADL's    Time 8   Period Weeks   Status On-going     PT LONG TERM GOAL #4   Title Patient will demsotrate 115 degrees of knee flexion in order to get up and down out of a low chair.    Time 8   Period Weeks   Status On-going               Plan - 03/26/16 1033    Clinical Impression Statement Therapy focused on extension stretching today. The patient is Independent with his exercises. His only limitation is with extension. He was advised to ice and elevate later as the extension stretching caused minor pain.    Rehab Potential Good   PT Frequency 2x / week   PT Duration 8 weeks   PT Treatment/Interventions ADLs/Self Care Home Management;Cryotherapy;Electrical Stimulation;Iontophoresis 4mg /ml Dexamethasone;Ultrasound;Functional mobility training;Gait training;Stair training;Neuromuscular re-education;Passive range of motion;Taping;Vasopneumatic Device;Scar mobilization;Manual techniques;Patient/family education;Therapeutic exercise   PT Next Visit Plan continue with stair training and gait traing. Focus on extension with manual therapy. Re-assess progress next visit: FOTO.    PT Home Exercise Plan Pre op exericises and Phase 1 TKR  exericses, progress as pt tolerates. Hip abduction in sidelying added today   Consulted and Agree with Plan of Care Patient      Patient will benefit from skilled therapeutic intervention in order to improve the following deficits and impairments:  Abnormal gait, Decreased activity tolerance, Obesity, Postural dysfunction, Improper body mechanics, Difficulty walking, Decreased strength, Increased fascial restricitons, Decreased mobility, Decreased range of motion  Visit Diagnosis: Stiffness of right knee, not elsewhere classified  Pain in right knee  Difficulty in walking, not elsewhere classified     Problem List Patient Active Problem List   Diagnosis Date Noted  . Fatigue 08/10/2015  . Erectile dysfunction 04/25/2015  . Anemia due to chronic blood loss 12/21/2014  . GI bleed 12/21/2014  . Chest pain at rest 09/30/2014  . Edema 07/13/2014  . Allergic rhinitis 03/17/2014  . PAF (paroxysmal atrial fibrillation) ([place]) 05/25/2013  . Cardiomegaly 12/12/2012  . Liver mass 12/12/2012  . Prostate cancer ([place]) 12/11/2012  . Hepatic cyst 12/11/2012  . Hematuria, gross 09/05/2012  . Knee osteoarthritis 09/05/2012  . OSA on CPAP 09/05/2012  . Snoring 09/07/2011  . Paresthesia of left arm 07/06/2011  . Gout 03/13/2011  . Well adult exam 03/03/2011  . Arthritis of knee 03/03/2011  . MENTAL CONFUSION 03/09/2010  . GAIT DISTURBANCE 03/09/2010  . HEAT STROKE 03/09/2010  . FOOT PAIN 11/08/2009  . CHEST PAIN UNSPECIFIED 08/19/2009  . COPD 07/08/2009  . TOBACCO USE, QUIT 07/08/2009  . RECTAL BLEEDING 12/07/2008  . PERSONAL HISTORY  OF COLONIC POLYPS 12/07/2008  . Dyslipidemia 03/02/2008  . HEMATOCHEZIA 03/02/2008  . COUGH 03/02/2008  . HYPOKALEMIA 02/03/2008  . Acute sinusitis 02/03/2008  . SYNCOPE 02/03/2008  . DM (diabetes mellitus), type 2 with complications ([place]) 123XX123  . Pain in joint, shoulder region 11/27/2007  . ERECTILE DYSFUNCTION 10/30/2007  . Essential  hypertension 10/30/2007  . OSTEOARTHRITIS 10/30/2007  . LOW BACK PAIN 10/30/2007    Carney Living  PT DPT  03/26/2016, 10:58 AM  [facility] [address] [place], [place], [address] Phone: [phone]   Fax:  [phone]  Name: [name] [name] MRN: [id number] Date of Birth: June 21, 1951

## 2016-03-30 ENCOUNTER — Ambulatory Visit: Payer: Medicare Other | Admitting: Physical Therapy

## 2016-03-30 DIAGNOSIS — M25661 Stiffness of right knee, not elsewhere classified: Secondary | ICD-10-CM

## 2016-03-30 DIAGNOSIS — R262 Difficulty in walking, not elsewhere classified: Secondary | ICD-10-CM

## 2016-03-30 DIAGNOSIS — M25561 Pain in right knee: Secondary | ICD-10-CM

## 2016-03-30 NOTE — Therapy (Signed)
[name], [place], [address] Phone: [phone]   Fax:  [phone]  Physical Therapy Treatment  Patient Details  Name: [name] [name] MRN: [id number] Date of Birth: [birth year] Referring Provider: Garlan Fillers MD   Encounter Date: 03/30/2016      PT End of Session - 03/30/16 [phone]    Visit Number 9  despite previous visit numbers today is the ninth visit from eval    Number of Visits 13   Date for PT Re-Evaluation 04/23/16   Authorization Type VA   Authorization Time Period 04/08/16   PT Start Time 0755   PT Stop Time 0846   PT Time Calculation (min) 51 min   Activity Tolerance Patient tolerated treatment well   Behavior During Therapy [facility] for tasks assessed/performed      Past Medical History:  Diagnosis Date  . Allergy   . Bilateral renal cysts    benign  . COPD (chronic obstructive pulmonary disease) ([place])     PFT 2010 due to previous 20y h/o smoking  . DDD (degenerative disc disease), lumbar   . Diabetes mellitus type II   . ED (erectile dysfunction) of organic origin   . First degree heart block   . History of colon polyps   . History of prostate cancer current PSA per pt 0.13   dx March 2014---  s/p radiactive prostate seed implants and intraop radiotherapy in [place], [place]  . Hyperlipidemia   . Hypertension   . OSA on CPAP   . Osteoarthritis   . PAF (paroxysmal atrial fibrillation) [facility])    cardiologist--  dr hochrein  (episode prior to liver surgery at [facility] [place] 2014 w/ post-op RVR)  . Prostate cancer ([place]) 11-11-2012   s/p chemo, radiation   . Rectal cancer ([place])    01-25-2015----per pathology --  complete rectal polypectomy-- showed  invasive carcinoma rising in tubular adnemoa high grade hyperplasia--  per dr Ardis Hughs per MRI  no other area seen -- will have colonoscopy done in 3 months  . Sleep apnea    wears cpap  . Wears glasses     Past Surgical History:   Procedure Laterality Date  .  RIGHT LOBE HEPATECTOMY  06-12-2013    [facility]   benign mass  . CARDIOVASCULAR STRESS TEST  10-01-2014   dr hochrein   normal perfusion study, no ischemia,  normal LV function and wall motion, ef 70%  . COLONOSCOPY    . EUS N/A 02/10/2015   Procedure: LOWER ENDOSCOPIC ULTRASOUND (EUS);  Surgeon: Milus Banister, MD;  Location: [facility];  Service: Endoscopy;  Laterality: N/A;  . KNEE ARTHROSCOPY Bilateral 1990's  . LUMBAR FUSION  2001  . PENILE PROSTHESIS IMPLANT N/A 04/25/2015   Procedure: PENILE PROSTHESIS THREE PIECE INFLATABLE( COLOPLAST) SCROTAL APPROACH;  Surgeon: Kathie Rhodes, MD;  Location: [address];  Service: Urology;  Laterality: N/A;  . POLYPECTOMY    . RADIOACTIVE PROSTATE SEED IMPLANTS  May 2014  in [place], [place]  . TOTAL KNEE ARTHROPLASTY Left 10- 2013  . TRANSTHORACIC ECHOCARDIOGRAM  12-26-2012   mild focal basal septal hypertrophy,  grade II diastolic dysfunction,  ef 55-60%,  mild LAE,  trivial MR and TR  . VASECTOMY  1980's w/ gen. anes.    There were no vitals filed for this visit.      Subjective Assessment - 03/30/16 0903    Subjective Patient reports no significant pain after last visait. he has  been going to the gym and getting on the elliptical.    Pertinent History Left TKR October 2013, DM2, Prostate CA remission; low back surgery    Limitations Sitting;Lifting;Walking   How long can you sit comfortably? 1 hour    How long can you stand comfortably? > 5 min    How long can you walk comfortably? ambulating limited household distances    Diagnostic tests nothing post op    Patient Stated Goals Patient would like to walk the dog and go up and down the steps. Participate in activity at church. Get down on hands and knees to [place] [employer].    Currently in Pain? No/denies   Pain Score 1    Pain Location Knee   Pain Orientation Right   Pain Descriptors / Indicators Discomfort   Pain Onset More than a month ago   Pain  Frequency Constant   Aggravating Factors  walking, stairs,    Pain Relieving Factors rest, massage    Effect of Pain on Daily Activities difficulting ambulating             [facility] PT Assessment - 03/30/16 0001      Ambulation/Gait   Gait Comments ambulation in the clinic with no device but increased antalgic gait                      [facility] Adult PT Treatment/Exercise - 03/30/16 0001      Exercises   Exercises Other Exercises  NuStep level 5, 7 minutes   Other Exercises  Nustep level 5, 7 minutes     Knee/Hip Exercises: Machines for Strengthening   Total Gym Leg Press 30x 3pl      Knee/Hip Exercises: Standing   Heel Raises Limitations x30   Hip Flexion Limitations standing march x20   Terminal Knee Extension Limitations 3x10   Abduction Limitations x20   Extension Limitations x20   Lateral Step Up Limitations 8"2x10    Forward Step Up Limitations 8"2x10    Functional Squat Limitations 2x10      Knee/Hip Exercises: Seated   Long Arc Quad Limitations 2.5 lbs x30    Hamstring Limitations red 3x10      Knee/Hip Exercises: Supine   Straight Leg Raises Limitations 3x10 2lb      Modalities   Modalities Cryotherapy;Vasopneumatic     Cryotherapy   Cryotherapy Location Knee     Vasopneumatic   Number Minutes Vasopneumatic  15 minutes   Vasopnuematic Location  Knee   Vasopneumatic Pressure Low   Vasopneumatic Temperature  32 degrees      Manual Therapy   Manual therapy comments PROM into all planes; Hamstring release with extension stretch;Grade II and grade III PA and AP mobs to improve flexion and extenson.                  PT Education - 03/30/16 0935    Education provided Yes   Education Details continue to work on stretching and stregthening    Person(s) Educated Patient   Methods Explanation   Comprehension Verbalized understanding;Returned demonstration          PT Short Term Goals - 03/26/16 1038      PT SHORT TERM GOAL #1    Title Patient will be I /w HEP for ROM and strengthening    Baseline He is perfroming exercises at home   Time 4   Period Weeks   Status Achieved     PT SHORT TERM  GOAL #2   Title Patient will increase gross right knee strength/ hip strength  to 4+/5   Baseline continues to work on stregthening strength improving    Time 4   Period Weeks   Status On-going     PT SHORT TERM GOAL #3   Title Patient will increase right knee flexion to 90 degrees    Baseline 108   Time 4   Period Weeks   Status Achieved     PT SHORT TERM GOAL #4   Title Patient will demonstrate full active extension of the right knee.   Time 4   Period Weeks   Status On-going           PT Long Term Goals - 03/12/16 [id number]      PT LONG TERM GOAL #1   Title Patient will ambulate 1 mile without self reported pain in order to walk hidog    Time 8   Period Weeks   Status On-going     PT LONG TERM GOAL #2   Title Patient will go up/down stairs with reciprocol gait pattern without pain in order to improve household safety   Time 8   Period Weeks   Status On-going     PT LONG TERM GOAL #3   Title Patient will stand for 1 hour ewithout increased pain in order to perfrom IADL's    Time 8   Period Weeks   Status On-going     PT LONG TERM GOAL #4   Title Patient will demsotrate 115 degrees of knee flexion in order to get up and down out of a low chair.    Time 8   Period Weeks   Status On-going               Plan - 03/30/16 0942    Clinical Impression Statement Therapy continues to focus on extension today. He was mesutred at 5-102 degrees of total motion. He otherwise is indepdnent with his exercise program.    Rehab Potential Good   PT Frequency 2x / week   PT Duration 8 weeks   PT Treatment/Interventions ADLs/Self Care Home Management;Cryotherapy;Electrical Stimulation;Iontophoresis 4mg /ml Dexamethasone;Ultrasound;Functional mobility training;Gait training;Stair training;Neuromuscular  re-education;Passive range of motion;Taping;Vasopneumatic Device;Scar mobilization;Manual techniques;Patient/family education;Therapeutic exercise   PT Next Visit Plan continue with stair training and gait traing. Focus on extension with manual therapy. Re-assess progress next visit: FOTO.    PT Home Exercise Plan Pre op exericises and Phase 1 TKR exericses, progress as pt tolerates. Hip abduction in sidelying added today   Consulted and Agree with Plan of Care Patient      Patient will benefit from skilled therapeutic intervention in order to improve the following deficits and impairments:  Abnormal gait, Decreased activity tolerance, Obesity, Postural dysfunction, Improper body mechanics, Difficulty walking, Decreased strength, Increased fascial restricitons, Decreased mobility, Decreased range of motion  Visit Diagnosis: Stiffness of right knee, not elsewhere classified  Pain in right knee  Difficulty in walking, not elsewhere classified     Problem List Patient Active Problem List   Diagnosis Date Noted  . Fatigue 08/10/2015  . Erectile dysfunction 04/25/2015  . Anemia due to chronic blood loss 12/21/2014  . GI bleed 12/21/2014  . Chest pain at rest 09/30/2014  . Edema 07/13/2014  . Allergic rhinitis 03/17/2014  . PAF (paroxysmal atrial fibrillation) ([place]) 05/25/2013  . Cardiomegaly 12/12/2012  . Liver mass 12/12/2012  . Prostate cancer ([place]) 12/11/2012  . Hepatic cyst 12/11/2012  . Hematuria, gross 09/05/2012  .  Knee osteoarthritis 09/05/2012  . OSA on CPAP 09/05/2012  . Snoring 09/07/2011  . Paresthesia of left arm 07/06/2011  . Gout 03/13/2011  . Well adult exam 03/03/2011  . Arthritis of knee 03/03/2011  . MENTAL CONFUSION 03/09/2010  . GAIT DISTURBANCE 03/09/2010  . HEAT STROKE 03/09/2010  . FOOT PAIN 11/08/2009  . CHEST PAIN UNSPECIFIED 08/19/2009  . COPD 07/08/2009  . TOBACCO USE, QUIT 07/08/2009  . RECTAL BLEEDING 12/07/2008  . PERSONAL HISTORY OF COLONIC  POLYPS 12/07/2008  . Dyslipidemia 03/02/2008  . HEMATOCHEZIA 03/02/2008  . COUGH 03/02/2008  . HYPOKALEMIA 02/03/2008  . Acute sinusitis 02/03/2008  . SYNCOPE 02/03/2008  . DM (diabetes mellitus), type 2 with complications ([place]) 123XX123  . Pain in joint, shoulder region 11/27/2007  . ERECTILE DYSFUNCTION 10/30/2007  . Essential hypertension 10/30/2007  . OSTEOARTHRITIS 10/30/2007  . LOW BACK PAIN 10/30/2007    Carney Living PT  03/30/2016, 9:54 AM  [facility] [address] [place], [place], [address] Phone: [phone]   Fax:  [phone]  Name: NIEKO HERSKOVITZ MRN: [id number] Date of Birth: Oct 27, 1950

## 2016-04-02 ENCOUNTER — Ambulatory Visit: Payer: Medicare Other | Admitting: Physical Therapy

## 2016-04-02 DIAGNOSIS — M25561 Pain in right knee: Secondary | ICD-10-CM

## 2016-04-02 DIAGNOSIS — M25661 Stiffness of right knee, not elsewhere classified: Secondary | ICD-10-CM

## 2016-04-02 DIAGNOSIS — R262 Difficulty in walking, not elsewhere classified: Secondary | ICD-10-CM

## 2016-04-02 NOTE — Therapy (Signed)
[place], [place], [address] Phone: [phone]   Fax:  [phone]  Physical Therapy Treatment  Patient Details  Name: [name] [name] MRN: [id number] Date of Birth: Mar 02, 1951 Referring Provider: Garlan Fillers MD   Encounter Date: 04/02/2016      PT End of Session - 04/02/16 1037    Visit Number 10   Number of Visits 13   Date for PT Re-Evaluation 04/23/16   Authorization Type VA   Authorization Time Period 04/08/16   PT Start Time 1017   PT Stop Time 1112   PT Time Calculation (min) 55 min   Activity Tolerance Patient tolerated treatment well   Behavior During Therapy [facility] for tasks assessed/performed      Past Medical History:  Diagnosis Date  . Allergy   . Bilateral renal cysts    benign  . COPD (chronic obstructive pulmonary disease) ([place])     PFT 2010 due to previous 20y h/o smoking  . DDD (degenerative disc disease), lumbar   . Diabetes mellitus type II   . ED (erectile dysfunction) of organic origin   . First degree heart block   . History of colon polyps   . History of prostate cancer current PSA per pt 0.13   dx March 2014---  s/p radiactive prostate seed implants and intraop radiotherapy in [place], [place]  . Hyperlipidemia   . Hypertension   . OSA on CPAP   . Osteoarthritis   . PAF (paroxysmal atrial fibrillation) [facility])    cardiologist--  dr hochrein  (episode prior to liver surgery at [facility] 2014 w/ post-op RVR)  . Prostate cancer ([place]) 11-11-2012   s/p chemo, radiation   . Rectal cancer ([place])    01-25-2015----per pathology --  complete rectal polypectomy-- showed  invasive carcinoma rising in tubular adnemoa high grade hyperplasia--  per dr Ardis Hughs per MRI  no other area seen -- will have colonoscopy done in 3 months  . Sleep apnea    wears cpap  . Wears glasses     Past Surgical History:  Procedure Laterality Date  .  RIGHT LOBE HEPATECTOMY  06-12-2013    [facility]    benign mass  . CARDIOVASCULAR STRESS TEST  10-01-2014   dr hochrein   normal perfusion study, no ischemia,  normal LV function and wall motion, ef 70%  . COLONOSCOPY    . EUS N/A 02/10/2015   Procedure: LOWER ENDOSCOPIC ULTRASOUND (EUS);  Surgeon: Milus Banister, MD;  Location: [facility];  Service: Endoscopy;  Laterality: N/A;  . KNEE ARTHROSCOPY Bilateral 1990's  . LUMBAR FUSION  2001  . PENILE PROSTHESIS IMPLANT N/A 04/25/2015   Procedure: PENILE PROSTHESIS THREE PIECE INFLATABLE( COLOPLAST) SCROTAL APPROACH;  Surgeon: Kathie Rhodes, MD;  Location: [facility];  Service: Urology;  Laterality: N/A;  . POLYPECTOMY    . RADIOACTIVE PROSTATE SEED IMPLANTS  May 2014  in [place], [place]  . TOTAL KNEE ARTHROPLASTY Left 10- 2013  . TRANSTHORACIC ECHOCARDIOGRAM  12-26-2012   mild focal basal septal hypertrophy,  grade II diastolic dysfunction,  ef 55-60%,  mild LAE,  trivial MR and TR  . VASECTOMY  1980's w/ gen. anes.    There were no vitals filed for this visit.      Subjective Assessment - 04/02/16 1026    Subjective Patient feels like his leg is starting to loosen up when he is walking. He is walking furter and using the cane less.  He had no pain last visit or over the weekend.    Pertinent History Left TKR October 2013, DM2, Prostate CA remission; low back surgery    Limitations Sitting;Lifting;Walking   How long can you sit comfortably? 1 hour    How long can you stand comfortably? > 5 min    How long can you walk comfortably? ambulating limited household distances    Diagnostic tests nothing post op    Patient Stated Goals Patient would like to walk the dog and go up and down the steps. Participate in activity at church. Get down on hands and knees to [place] [employer].    Currently in Pain? No/denies            [facility] PT Assessment - 04/02/16 0001      PROM   Right Knee Extension 5   Right Knee Flexion 106                     [facility] Adult PT  Treatment/Exercise - 04/02/16 0001      Ambulation/Gait   Gait Comments ambulation in the clinic with no device but increased antalgic gait      Self-Care   Self-Care Other Self-Care Comments   Other Self-Care Comments  Pstient educated on proper set-up reps and weight with gym equipment. Patient also educated on the improtance of regaining full range and stretching at work.      Exercises   Exercises Other Exercises  NuStep level 5, 7 minutes   Other Exercises  Nustep level 5, 7 minutes     Knee/Hip Exercises: Machines for Strengthening   Total Gym Leg Press 30x 3pl    Other Machine hip extension 3pl x30 hip abduction 2 plates x20.      Knee/Hip Exercises: Standing   Heel Raises Limitations x40   Hip Flexion Limitations standing march x20   Terminal Knee Extension Limitations 3x10   Abduction Limitations x20   Extension Limitations x20   Lateral Step Up Limitations 8"2x10    Forward Step Up Limitations 8"2x10    Functional Squat Limitations 2x10      Knee/Hip Exercises: Seated   Long Arc Quad Limitations 2.5 lbs x30    Hamstring Limitations red 3x10      Knee/Hip Exercises: Supine   Straight Leg Raises Limitations 3x10 2lb      Modalities   Modalities Cryotherapy;Vasopneumatic     Cryotherapy   Cryotherapy Location Knee     Vasopneumatic   Number Minutes Vasopneumatic  15 minutes   Vasopnuematic Location  Knee   Vasopneumatic Pressure Low   Vasopneumatic Temperature  32 degrees      Manual Therapy   Manual therapy comments PROM into all planes; Hamstring release with extension stretch;Grade II and grade III PA and AP mobs to improve flexion and extenson.                  PT Education - 04/02/16 1027    Education provided Yes   Education Details reviewed goals with the patient    Person(s) Educated Patient   Methods Explanation   Comprehension Verbalized understanding;Returned demonstration          PT Short Term Goals - 04/02/16 1042      PT  SHORT TERM GOAL #1   Title Patient will be I /w HEP for ROM and strengthening    Baseline He is perfroming exercises at home   Time 4   Period Weeks   Status  Achieved     PT SHORT TERM GOAL #2   Title Patient will increase gross right knee strength/ hip strength  to 4+/5   Baseline continues to work on stregthening strength improving    Time 4   Period Weeks   Status Achieved     PT SHORT TERM GOAL #3   Title Patient will increase right knee flexion to 90 degrees    Baseline 108   Period Weeks   Status Achieved     PT SHORT TERM GOAL #4   Title Patient will demonstrate full active extension of the right knee.   Baseline 5   Time 4   Period Weeks   Status On-going           PT Long Term Goals - 04-11-2016 1042      PT LONG TERM GOAL #1   Title Patient will ambulate 1 mile without self reported pain in order to walk his dog    Baseline can walk abouyt a mile    Time 8   Period Weeks   Status Achieved     PT LONG TERM GOAL #2   Title Patient will go up/down stairs with reciprocol gait pattern without pain in order to improve household safety   Time 8   Period Weeks   Status Achieved     PT LONG TERM GOAL #3   Title Patient will stand for 1 hour ewithout increased pain in order to perfrom IADL's    Time 8   Period Weeks   Status Achieved     PT LONG TERM GOAL #4   Title Patient will demsotrate 115 degrees of knee flexion in order to get up and down out of a low chair.    Baseline still working on range of motion    Time 8   Period Weeks   Status Achieved               Plan - 04-11-16 2033    Clinical Impression Statement Patient is making good progress although his motion is still limited . He was encouraged to continue focusing on stretching rather then stregthening to get his range back. He has met most goals for therapy. See below for goal specific progress.    Rehab Potential Good   PT Frequency 2x / week   PT Duration 8 weeks   PT  Treatment/Interventions ADLs/Self Care Home Management;Cryotherapy;Electrical Stimulation;Iontophoresis 67m/ml Dexamethasone;Ultrasound;Functional mobility training;Gait training;Stair training;Neuromuscular re-education;Passive range of motion;Taping;Vasopneumatic Device;Scar mobilization;Manual techniques;Patient/family education;Therapeutic exercise   PT Next Visit Plan continue with stair training and gait traing. Focus on extension with manual therapy. Re-assess progress next visit: FOTO.    PT Home Exercise Plan Pre op exericises and Phase 1 TKR exericses, progress as pt tolerates. Hip abduction in sidelying added today   Consulted and Agree with Plan of Care Patient      Patient will benefit from skilled therapeutic intervention in order to improve the following deficits and impairments:  Abnormal gait, Decreased activity tolerance, Obesity, Postural dysfunction, Improper body mechanics, Difficulty walking, Decreased strength, Increased fascial restricitons, Decreased mobility, Decreased range of motion  Visit Diagnosis: Stiffness of right knee, not elsewhere classified  Pain in right knee  Difficulty in walking, not elsewhere classified       G-Codes - [id number]    Functional Assessment Tool Used clinical decision making, objective measurements    Functional Limitation Mobility: Walking and moving around   Mobility: Walking and Moving Around Current Status [phone]  At least 40 percent but less than 60 percent impaired, limited or restricted   Mobility: Walking and Moving Around Goal Status [phone]) At least 1 percent but less than 20 percent impaired, limited or restricted      Problem List Patient Active Problem List   Diagnosis Date Noted  . Fatigue 08/10/2015  . Erectile dysfunction 04/25/2015  . Anemia due to chronic blood loss 12/21/2014  . GI bleed 12/21/2014  . Chest pain at rest 09/30/2014  . Edema 07/13/2014  . Allergic rhinitis 03/17/2014  . PAF (paroxysmal  atrial fibrillation) ([place]) 05/25/2013  . Cardiomegaly 12/12/2012  . Liver mass 12/12/2012  . Prostate cancer ([place]) 12/11/2012  . Hepatic cyst 12/11/2012  . Hematuria, gross 09/05/2012  . Knee osteoarthritis 09/05/2012  . OSA on CPAP 09/05/2012  . Snoring 09/07/2011  . Paresthesia of left arm 07/06/2011  . Gout 03/13/2011  . Well adult exam 03/03/2011  . Arthritis of knee 03/03/2011  . MENTAL CONFUSION 03/09/2010  . GAIT DISTURBANCE 03/09/2010  . HEAT STROKE 03/09/2010  . FOOT PAIN 11/08/2009  . CHEST PAIN UNSPECIFIED 08/19/2009  . COPD 07/08/2009  . TOBACCO USE, QUIT 07/08/2009  . RECTAL BLEEDING 12/07/2008  . PERSONAL HISTORY OF COLONIC POLYPS 12/07/2008  . Dyslipidemia 03/02/2008  . HEMATOCHEZIA 03/02/2008  . COUGH 03/02/2008  . HYPOKALEMIA 02/03/2008  . Acute sinusitis 02/03/2008  . SYNCOPE 02/03/2008  . DM (diabetes mellitus), type 2 with complications ([place]) 53/20/2334  . Pain in joint, shoulder region 11/27/2007  . ERECTILE DYSFUNCTION 10/30/2007  . Essential hypertension 10/30/2007  . OSTEOARTHRITIS 10/30/2007  . LOW BACK PAIN 10/30/2007    Carney Living PT DPT  04/02/2016, 8:43 PM  [facility] [address] [place], [place], [address] Phone: [phone]   Fax:  [phone]  Name: [name] [name] MRN: [id number] Date of Birth: 25-Apr-1951

## 2016-04-04 ENCOUNTER — Encounter: Payer: Medicare Other | Admitting: Internal Medicine

## 2016-04-06 ENCOUNTER — Encounter: Payer: Medicare Other | Admitting: Physical Therapy

## 2016-04-09 ENCOUNTER — Ambulatory Visit: Payer: Medicare Other | Attending: Adult Reconstructive Orthopaedic Surgery | Admitting: Physical Therapy

## 2016-04-09 DIAGNOSIS — M25661 Stiffness of right knee, not elsewhere classified: Secondary | ICD-10-CM | POA: Insufficient documentation

## 2016-04-09 DIAGNOSIS — M25561 Pain in right knee: Secondary | ICD-10-CM | POA: Diagnosis present

## 2016-04-09 DIAGNOSIS — R262 Difficulty in walking, not elsewhere classified: Secondary | ICD-10-CM | POA: Diagnosis present

## 2016-04-09 NOTE — Therapy (Addendum)
[place], [place], [address] Phone: [phone]   Fax:  [phone]  Physical Therapy Treatment  Patient Details  Name: [name] [name] MRN: [id number] Date of Birth: 22-Nov-1950 Referring Provider: Garlan Fillers MD   Encounter Date: 04/09/2016      PT End of Session - 04/09/16 0951    Visit Number 11   Number of Visits 13   Date for PT Re-Evaluation 04/23/16   Authorization Type VA   Authorization Time Period 04/08/16   PT Start Time 0933   PT Stop Time 1023   PT Time Calculation (min) 50 min   Activity Tolerance Patient tolerated treatment well   Behavior During Therapy [facility] for tasks assessed/performed      Past Medical History:  Diagnosis Date  . Allergy   . Bilateral renal cysts    benign  . COPD (chronic obstructive pulmonary disease) ([place])     PFT 2010 due to previous 20y h/o smoking  . DDD (degenerative disc disease), lumbar   . Diabetes mellitus type II   . ED (erectile dysfunction) of organic origin   . First degree heart block   . History of colon polyps   . History of prostate cancer current PSA per pt 0.13   dx March 2014---  s/p radiactive prostate seed implants and intraop radiotherapy in [place], [place]  . Hyperlipidemia   . Hypertension   . OSA on CPAP   . Osteoarthritis   . PAF (paroxysmal atrial fibrillation) [facility])    cardiologist--  dr hochrein  (episode prior to liver surgery at [facility] 2014 w/ post-op RVR)  . Prostate cancer ([place]) 11-11-2012   s/p chemo, radiation   . Rectal cancer ([place])    01-25-2015----per pathology --  complete rectal polypectomy-- showed  invasive carcinoma rising in tubular adnemoa high grade hyperplasia--  per dr Ardis Hughs per MRI  no other area seen -- will have colonoscopy done in 3 months  . Sleep apnea    wears cpap  . Wears glasses     Past Surgical History:  Procedure Laterality Date  .  RIGHT LOBE HEPATECTOMY  06-12-2013    [facility]    benign mass  . CARDIOVASCULAR STRESS TEST  10-01-2014   dr hochrein   normal perfusion study, no ischemia,  normal LV function and wall motion, ef 70%  . COLONOSCOPY    . EUS N/A 02/10/2015   Procedure: LOWER ENDOSCOPIC ULTRASOUND (EUS);  Surgeon: Milus Banister, MD;  Location[facility];  Service: Endoscopy;  Laterality: N/A;  . KNEE ARTHROSCOPY Bilateral 1990's  . LUMBAR FUSION  2001  . PENILE PROSTHESIS IMPLANT N/A 04/25/2015   Procedure: PENILE PROSTHESIS THREE PIECE INFLATABLE( COLOPLAST) SCROTAL APPROACH;  Surgeon: Kathie Rhodes, MD;  Location: [facility];  Service: Urology;  Laterality: N/A;  . POLYPECTOMY    . RADIOACTIVE PROSTATE SEED IMPLANTS  May 2014  in [place], [place]  . TOTAL KNEE ARTHROPLASTY Left 10- 2013  . TRANSTHORACIC ECHOCARDIOGRAM  12-26-2012   mild focal basal septal hypertrophy,  grade II diastolic dysfunction,  ef 55-60%,  mild LAE,  trivial MR and TR  . VASECTOMY  1980's w/ gen. anes.    There were no vitals filed for this visit.      Subjective Assessment - 04/09/16 0936    Subjective Patient went on a vacation over the weekedn and did a liot of walking. He ambulated without the cane all weekend. He had  no pain. He does have some pain today but it is minimal. Je continues to owkr hard on his bend and extension.    Pertinent History Left TKR October 2013, DM2, Prostate CA remission; low back surgery    Limitations Sitting;Lifting;Walking   How long can you sit comfortably? 1 hour    How long can you stand comfortably? > 5 min    How long can you walk comfortably? ambulating limited household distances    Diagnostic tests nothing post op    Patient Stated Goals Patient would like to walk the dog and go up and down the steps. Participate in activity at church. Get down on hands and knees to [place] [employer].    Currently in Pain? Yes   Pain Score 3    Pain Location Knee   Pain Orientation Right   Pain Descriptors / Indicators Discomfort   Pain Type  Surgical pain   Pain Onset More than a month ago   Pain Frequency Constant   Aggravating Factors  walking, stairs    Pain Relieving Factors rest, massage    Effect of Pain on Daily Activities difficulty ambulaitng    Multiple Pain Sites No                                 PT Education - 04/09/16 0938    Education provided Yes   Education Details reviewed goals with the patient    Person(s) Educated Patient   Methods Explanation   Comprehension Verbalized understanding;Returned demonstration          PT Short Term Goals - 04/09/16 1001      PT SHORT TERM GOAL #1   Title Patient will be I /w HEP for ROM and strengthening    Baseline He is perfroming exercises at home   Time 4   Period Weeks   Status Achieved     PT SHORT TERM GOAL #2   Title Patient will increase gross right knee strength/ hip strength  to 4+/5   Baseline continues to work on stregthening strength improving    Time 4   Period Weeks   Status Achieved     PT SHORT TERM GOAL #3   Title Patient will increase right knee flexion to 90 degrees    Baseline 108   Time 4   Period Weeks   Status Achieved     PT SHORT TERM GOAL #4   Title Patient will demonstrate full active extension of the right knee.   Baseline 5   Time 4   Period Weeks   Status On-going           PT Long Term Goals - 04/09/16 1004      PT LONG TERM GOAL #1   Title Patient will ambulate 1 mile without self reported pain in order to walk his dog    Baseline can walk a mile without increase pain    Time 8   Period Weeks   Status Achieved     PT LONG TERM GOAL #2   Title Patient will go up/down stairs with reciprocol gait pattern without pain in order to improve household safety   Baseline was able to walk up and down stairs over the weekend with oi difficulty    Time 8   Period Weeks   Status Achieved     PT LONG TERM GOAL #3   Title Patient will stand for  1 hour ewithout increased pain in order to  perfrom IADL's    Time 8   Period Weeks   Status Achieved     PT LONG TERM GOAL #4   Title Patient will demsotrate 115 degrees of knee flexion in order to get up and down out of a low chair.    Baseline 108 degrees this morning    Time 8   Period Weeks               Plan - 04/09/16 6553    Clinical Impression Statement Patient has made great progress and is very motived. He does still lack some range of motion though. His total motion was measured at 5-108 degrees. He is walking without his cane the majority of the time but needs it for distance. He still has an antalgic gait 2nd to a lack of terminal knee extension. He has reached most functional goals. He would benefit from furter therapy to Improve his motion.     Rehab Potential Good   PT Frequency 2x / week   PT Duration 8 weeks   PT Treatment/Interventions ADLs/Self Care Home Management;Cryotherapy;Electrical Stimulation;Iontophoresis 48m/ml Dexamethasone;Ultrasound;Functional mobility training;Gait training;Stair training;Neuromuscular re-education;Passive range of motion;Taping;Vasopneumatic Device;Scar mobilization;Manual techniques;Patient/family education;Therapeutic exercise   PT Next Visit Plan continue with stair training and gait traing. Focus on extension with manual therapy. Re-assess progress next visit: FOTO.    PT Home Exercise Plan Pre op exericises and Phase 1 TKR exericses, progress as pt tolerates. Hip abduction in sidelying added today   Consulted and Agree with Plan of Care Patient      Patient will benefit from skilled therapeutic intervention in order to improve the following deficits and impairments:  Abnormal gait, Decreased activity tolerance, Obesity, Postural dysfunction, Improper body mechanics, Difficulty walking, Decreased strength, Increased fascial restricitons, Decreased mobility, Decreased range of motion  Visit Diagnosis: Stiffness of right knee, not elsewhere classified  Difficulty in  walking, not elsewhere classified  Pain in right knee     Problem List Patient Active Problem List   Diagnosis Date Noted  . Fatigue 08/10/2015  . Erectile dysfunction 04/25/2015  . Anemia due to chronic blood loss 12/21/2014  . GI bleed 12/21/2014  . Chest pain at rest 09/30/2014  . Edema 07/13/2014  . Allergic rhinitis 03/17/2014  . PAF (paroxysmal atrial fibrillation) ([place] 05/25/2013  . Cardiomegaly 12/12/2012  . Liver mass 12/12/2012  . [place]state cancer ([place] 12/11/2012  . Hepatic cyst 12/11/2012  . Hematuria, gross 09/05/2012  . Knee osteoarthritis 09/05/2012  . OSA on CPAP 09/05/2012  . Snoring 09/07/2011  . Paresthesia of left arm 07/06/2011  . Gout 03/13/2011  . Well adult exam 03/03/2011  . Arthritis of knee 03/03/2011  . MENTAL CONFUSION 03/09/2010  . GAIT DISTURBANCE 03/09/2010  . HEAT STROKE 03/09/2010  . FOOT PAIN 11/08/2009  . CHEST PAIN UNSPECIFIED 08/19/2009  . COPD 07/08/2009  . TOBACCO USE, QUIT 07/08/2009  . RECTAL BLEEDING 12/07/2008  . PERSONAL HISTORY OF COLONIC POLYPS 12/07/2008  . Dyslipidemia 03/02/2008  . HEMATOCHEZIA 03/02/2008  . COUGH 03/02/2008  . HYPOKALEMIA 02/03/2008  . Acute sinusitis 02/03/2008  . SYNCOPE 02/03/2008  . DM (diabetes mellitus), type 2 with complications ([place] 074/82/7078 . Pain in joint, shoulder region 11/27/2007  . ERECTILE DYSFUNCTION 10/30/2007  . Essential hypertension 10/30/2007  . OSTEOARTHRITIS 10/30/2007  . LOW BACK PAIN 10/30/2007   PHYSICAL THERAPY DISCHARGE SUMMARY  Visits from Start of Care: 11  Current functional level related to goals /  functional outcomes: Still lacking motion. Did not return to therapy    Remaining deficits: Lacking motion    Education / Equipment: HEP  Plan: Patient agrees to discharge.  Patient goals were partially met. Patient is being discharged due to meeting the stated rehab goals.  ?????     Carney Living PT DPT  04/09/2016, 3:35 PM  [facility] [address] [place], [place], [address] Phone: [phone]   Fax:  [phone]  Name: [name] [name] MRN: [id number] Date of Birth: 19-Nov-1950

## 2016-05-25 ENCOUNTER — Other Ambulatory Visit: Payer: Self-pay | Admitting: Internal Medicine

## 2016-06-06 ENCOUNTER — Encounter: Payer: Self-pay | Admitting: Gastroenterology

## 2016-06-08 ENCOUNTER — Other Ambulatory Visit: Payer: Self-pay | Admitting: Internal Medicine

## 2016-06-13 ENCOUNTER — Encounter: Payer: Self-pay | Admitting: Internal Medicine

## 2016-06-18 ENCOUNTER — Encounter: Payer: Self-pay | Admitting: Internal Medicine

## 2016-06-22 ENCOUNTER — Telehealth: Payer: Self-pay | Admitting: Internal Medicine

## 2016-06-22 ENCOUNTER — Observation Stay (HOSPITAL_COMMUNITY): Payer: Medicare Other

## 2016-06-22 ENCOUNTER — Encounter (HOSPITAL_COMMUNITY): Payer: Self-pay

## 2016-06-22 ENCOUNTER — Observation Stay (HOSPITAL_COMMUNITY)
Admission: EM | Admit: 2016-06-22 | Discharge: 2016-06-23 | Disposition: A | Discharge status: Home / Self Care | Payer: Medicare Other | Attending: Internal Medicine | Admitting: Internal Medicine

## 2016-06-22 DIAGNOSIS — N400 Enlarged prostate without lower urinary tract symptoms: Secondary | ICD-10-CM | POA: Insufficient documentation

## 2016-06-22 DIAGNOSIS — E118 Type 2 diabetes mellitus with unspecified complications: Secondary | ICD-10-CM | POA: Diagnosis present

## 2016-06-22 DIAGNOSIS — Z9989 Dependence on other enabling machines and devices: Secondary | ICD-10-CM | POA: Diagnosis not present

## 2016-06-22 DIAGNOSIS — E785 Hyperlipidemia, unspecified: Secondary | ICD-10-CM | POA: Insufficient documentation

## 2016-06-22 DIAGNOSIS — R109 Unspecified abdominal pain: Secondary | ICD-10-CM

## 2016-06-22 DIAGNOSIS — J449 Chronic obstructive pulmonary disease, unspecified: Secondary | ICD-10-CM | POA: Diagnosis not present

## 2016-06-22 DIAGNOSIS — K259 Gastric ulcer, unspecified as acute or chronic, without hemorrhage or perforation: Secondary | ICD-10-CM | POA: Insufficient documentation

## 2016-06-22 DIAGNOSIS — Z87891 Personal history of nicotine dependence: Secondary | ICD-10-CM | POA: Diagnosis not present

## 2016-06-22 DIAGNOSIS — I10 Essential (primary) hypertension: Secondary | ICD-10-CM | POA: Diagnosis not present

## 2016-06-22 DIAGNOSIS — Z7982 Long term (current) use of aspirin: Secondary | ICD-10-CM | POA: Insufficient documentation

## 2016-06-22 DIAGNOSIS — Z9221 Personal history of antineoplastic chemotherapy: Secondary | ICD-10-CM | POA: Diagnosis not present

## 2016-06-22 DIAGNOSIS — Z86010 Personal history of colonic polyps: Secondary | ICD-10-CM | POA: Diagnosis not present

## 2016-06-22 DIAGNOSIS — Z800 Family history of malignant neoplasm of digestive organs: Secondary | ICD-10-CM | POA: Insufficient documentation

## 2016-06-22 DIAGNOSIS — E119 Type 2 diabetes mellitus without complications: Secondary | ICD-10-CM | POA: Insufficient documentation

## 2016-06-22 DIAGNOSIS — K921 Melena: Principal | ICD-10-CM | POA: Insufficient documentation

## 2016-06-22 DIAGNOSIS — Z923 Personal history of irradiation: Secondary | ICD-10-CM | POA: Diagnosis not present

## 2016-06-22 DIAGNOSIS — Z7984 Long term (current) use of oral hypoglycemic drugs: Secondary | ICD-10-CM | POA: Insufficient documentation

## 2016-06-22 DIAGNOSIS — I480 Paroxysmal atrial fibrillation: Secondary | ICD-10-CM | POA: Insufficient documentation

## 2016-06-22 DIAGNOSIS — G4730 Sleep apnea, unspecified: Secondary | ICD-10-CM | POA: Insufficient documentation

## 2016-06-22 DIAGNOSIS — E876 Hypokalemia: Secondary | ICD-10-CM | POA: Diagnosis not present

## 2016-06-22 DIAGNOSIS — Z66 Do not resuscitate: Secondary | ICD-10-CM | POA: Diagnosis not present

## 2016-06-22 DIAGNOSIS — Z23 Encounter for immunization: Secondary | ICD-10-CM | POA: Diagnosis not present

## 2016-06-22 DIAGNOSIS — Z79899 Other long term (current) drug therapy: Secondary | ICD-10-CM | POA: Diagnosis not present

## 2016-06-22 DIAGNOSIS — M109 Gout, unspecified: Secondary | ICD-10-CM | POA: Diagnosis present

## 2016-06-22 DIAGNOSIS — Z96653 Presence of artificial knee joint, bilateral: Secondary | ICD-10-CM | POA: Diagnosis not present

## 2016-06-22 DIAGNOSIS — K3189 Other diseases of stomach and duodenum: Secondary | ICD-10-CM | POA: Insufficient documentation

## 2016-06-22 DIAGNOSIS — K2950 Unspecified chronic gastritis without bleeding: Secondary | ICD-10-CM | POA: Diagnosis not present

## 2016-06-22 LAB — COMPREHENSIVE METABOLIC PANEL
ALBUMIN: 3.4 g/dL — AB (ref 3.5–5.0)
ALK PHOS: 55 U/L (ref 38–126)
ALT: 16 U/L — ABNORMAL LOW (ref 17–63)
ANION GAP: 7 (ref 5–15)
AST: 19 U/L (ref 15–41)
BILIRUBIN TOTAL: 0.2 mg/dL — AB (ref 0.3–1.2)
BUN: 31 mg/dL — ABNORMAL HIGH (ref 6–20)
CALCIUM: 8.9 mg/dL (ref 8.9–10.3)
CO2: 24 mmol/L (ref 22–32)
CREATININE: 0.92 mg/dL (ref 0.61–1.24)
Chloride: 113 mmol/L — ABNORMAL HIGH (ref 101–111)
GFR calc non Af Amer: >60 mL/min (ref >60)
GLUCOSE: 122 mg/dL — AB (ref 65–99)
Potassium: 3.4 mmol/L — ABNORMAL LOW (ref 3.5–5.1)
Sodium: 144 mmol/L (ref 135–145)
TOTAL PROTEIN: 6.9 g/dL (ref 6.5–8.1)

## 2016-06-22 LAB — TYPE AND SCREEN
ABO/RH(D): O POS
ANTIBODY SCREEN: NEGATIVE

## 2016-06-22 LAB — CBC
HCT: 32.8 % — ABNORMAL LOW (ref 39.0–52.0)
HEMOGLOBIN: 10.3 g/dL — AB (ref 13.0–17.0)
MCH: 27.8 pg (ref 26.0–34.0)
MCHC: 31.4 g/dL (ref 30.0–36.0)
MCV: 88.6 fL (ref 78.0–100.0)
PLATELETS: 297 10*3/uL (ref 150–400)
RBC: 3.7 MIL/uL — ABNORMAL LOW (ref 4.22–5.81)
RDW: 14.9 % (ref 11.5–15.5)
WBC: 7.2 10*3/uL (ref 4.0–10.5)

## 2016-06-22 LAB — HEMOGLOBIN AND HEMATOCRIT, BLOOD
HCT: 29.1 % — ABNORMAL LOW (ref 39.0–52.0)
HEMOGLOBIN: 9.4 g/dL — AB (ref 13.0–17.0)

## 2016-06-22 LAB — POC OCCULT BLOOD, ED: Fecal Occult Bld: POSITIVE — AB

## 2016-06-22 LAB — ABO/RH: ABO/RH(D): O POS

## 2016-06-22 LAB — PROTIME-INR
INR: 1.09
PROTHROMBIN TIME: 14.2 s (ref 11.4–15.2)

## 2016-06-22 LAB — MAGNESIUM: MAGNESIUM: 2.1 mg/dL (ref 1.7–2.4)

## 2016-06-22 LAB — GLUCOSE, CAPILLARY: Glucose-Capillary: 97 mg/dL (ref 65–99)

## 2016-06-22 MED ORDER — TRAZODONE HCL 50 MG PO TABS: 25.0000 mg | ORAL_TABLET | Freq: Every evening | ORAL | Status: DC | PRN

## 2016-06-22 MED ORDER — SODIUM CHLORIDE 0.9 % IV SOLN
INTRAVENOUS | Status: DC
  Administered 2016-06-22: 21:00:00 via INTRAVENOUS

## 2016-06-22 MED ORDER — METRONIDAZOLE 500 MG PO TABS: 2000.0000 mg | ORAL_TABLET | Freq: Once | ORAL | Status: DC

## 2016-06-22 MED ORDER — PANTOPRAZOLE SODIUM 40 MG IV SOLR
40.0000 mg | Freq: Two times a day (BID) | INTRAVENOUS | Status: DC
  Administered 2016-06-22 – 2016-06-23 (×3): 40 mg via INTRAVENOUS
  Filled 2016-06-22 (×3): qty 40

## 2016-06-22 MED ORDER — PRAVASTATIN SODIUM 40 MG PO TABS: 40.0000 mg | ORAL_TABLET | Freq: Every day | ORAL | Status: DC

## 2016-06-22 MED ORDER — INSULIN ASPART 100 UNIT/ML ~~LOC~~ SOLN: 0.0000 [IU] | SUBCUTANEOUS | Status: DC

## 2016-06-22 MED ORDER — ALBUTEROL SULFATE (2.5 MG/3ML) 0.083% IN NEBU: 2.5000 mg | INHALATION_SOLUTION | RESPIRATORY_TRACT | Status: DC | PRN

## 2016-06-22 MED ORDER — POTASSIUM CHLORIDE CRYS ER 20 MEQ PO TBCR
40.0000 meq | EXTENDED_RELEASE_TABLET | Freq: Once | ORAL | Status: AC
  Administered 2016-06-22: 40 meq via ORAL
  Filled 2016-06-22: qty 2

## 2016-06-22 MED ORDER — SODIUM CHLORIDE 0.9 % IV BOLUS (SEPSIS)
1000.0000 mL | Freq: Once | INTRAVENOUS | Status: AC
Start: 2016-06-22 — End: 2016-06-22
  Administered 2016-06-22: 1000 mL via INTRAVENOUS

## 2016-06-22 MED ORDER — CEFTRIAXONE SODIUM 250 MG IJ SOLR: 250.0000 mg | Freq: Once | INTRAMUSCULAR | Status: DC

## 2016-06-22 MED ORDER — FLUTICASONE PROPIONATE 50 MCG/ACT NA SUSP
1.0000 | Freq: Every day | NASAL | Status: DC | PRN
  Filled 2016-06-22: qty 16

## 2016-06-22 MED ORDER — METOPROLOL TARTRATE 50 MG PO TABS
50.0000 mg | ORAL_TABLET | Freq: Every day | ORAL | Status: DC
  Administered 2016-06-23: 50 mg via ORAL
  Filled 2016-06-22: qty 1

## 2016-06-22 MED ORDER — POLYVINYL ALCOHOL 1.4 % OP SOLN
1.0000 [drp] | OPHTHALMIC | Status: DC | PRN
  Filled 2016-06-22: qty 15

## 2016-06-22 MED ORDER — CARBOXYMETHYLCELLULOSE SODIUM 1 % OP SOLN: 2.0000 [drp] | OPHTHALMIC | Status: DC | PRN

## 2016-06-22 MED ORDER — PNEUMOCOCCAL VAC POLYVALENT 25 MCG/0.5ML IJ INJ
0.5000 mL | INJECTION | INTRAMUSCULAR | Status: AC
  Administered 2016-06-23: 0.5 mL via INTRAMUSCULAR
  Filled 2016-06-22: qty 0.5

## 2016-06-22 MED ORDER — ONDANSETRON HCL 4 MG/2ML IJ SOLN: 4.0000 mg | Freq: Four times a day (QID) | INTRAMUSCULAR | Status: DC | PRN

## 2016-06-22 MED ORDER — ONDANSETRON HCL 4 MG PO TABS: 4.0000 mg | ORAL_TABLET | Freq: Four times a day (QID) | ORAL | Status: DC | PRN

## 2016-06-22 MED ORDER — ACETAMINOPHEN 650 MG RE SUPP: 650.0000 mg | Freq: Four times a day (QID) | RECTAL | Status: DC | PRN

## 2016-06-22 MED ORDER — ACETAMINOPHEN 325 MG PO TABS: 650.0000 mg | ORAL_TABLET | Freq: Four times a day (QID) | ORAL | Status: DC | PRN

## 2016-06-22 MED ORDER — HYDRALAZINE HCL 20 MG/ML IJ SOLN
10.0000 mg | Freq: Three times a day (TID) | INTRAMUSCULAR | Status: DC | PRN
Start: 2016-06-22 — End: 2016-06-23

## 2016-06-22 MED ORDER — TAMSULOSIN HCL 0.4 MG PO CAPS
0.4000 mg | ORAL_CAPSULE | Freq: Every morning | ORAL | Status: DC
  Administered 2016-06-23: 0.4 mg via ORAL
  Filled 2016-06-22: qty 1

## 2016-06-22 MED ORDER — SODIUM CHLORIDE 0.9% FLUSH: 3.0000 mL | Freq: Two times a day (BID) | INTRAVENOUS | Status: DC

## 2016-06-22 NOTE — Progress Notes (Signed)
Admitted to 6n10 from ED due to rectal bleeding, patient alert and oriented, not in any distress. Oriented to room and staff.VSS. Will endorse appropriately.

## 2016-06-22 NOTE — ED Notes (Signed)
Pt ambulated to and from restroom; tolerated well 

## 2016-06-22 NOTE — H&P (Signed)
[facility] History and Physical  [name] [name] CLE:[id number] DOB: 1950-12-22 DOA: 06/22/2016  Referring physician:  PCP: Walker Kehr, MD   Chief Complaint: "I wiped and I saw it was black.  HPI: [name] [name] is a 65 y.o. male with past medical history of hypertension, etc. sleep apnea, paroxysmal A. fib, diabetes and rectal cancer presents emergency room with chief complaint of black stools. Patient states that he is doing well. Overnight he have to go to the bathroom and had a very large bowel movement. He examined the toilet paper after he wiped and saw that it was black. He had a large amount of black stool. This alarmed him and he came to the emergency room, patient states he has no history of melena.  Of note patient had a flu shot yesterday.  Emergency room: In the emergency room patient got a digital rectal exam that showed black stool, guaiac positive. Patient was typed and screened. Hemoglobin had gone down from 13-10. Patient hemodynamically stable. CT abdomen and pelvis showed no acute abnormalities.   Review of Systems:  As per HPI otherwise 10 point review of systems negative.    Past Medical History:  Diagnosis Date  . Allergy   . Bilateral renal cysts    benign  . COPD (chronic obstructive pulmonary disease) ([place])     PFT 2010 due to previous 20y h/o smoking  . DDD (degenerative disc disease), lumbar   . Diabetes mellitus type II   . ED (erectile dysfunction) of organic origin   . First degree heart block   . History of colon polyps   . History of prostate cancer current PSA per pt 0.13   dx March 2014---  s/p radiactive prostate seed implants and intraop radiotherapy in [place], [place]  . Hyperlipidemia   . Hypertension   . OSA on CPAP   . Osteoarthritis   . PAF (paroxysmal atrial fibrillation) [facility])    cardiologist--  dr hochrein  (episode prior to liver surgery at [facility] 2014 w/ post-op RVR)  . Prostate cancer ([place]) 11-11-2012   s/p  chemo, radiation   . Rectal cancer ([place])    01-25-2015----per pathology --  complete rectal polypectomy-- showed  invasive carcinoma rising in tubular adnemoa high grade hyperplasia--  per dr Ardis Hughs per MRI  no other area seen -- will have colonoscopy done in 3 months  . Sleep apnea    wears cpap  . Wears glasses    Past Surgical History:  Procedure Laterality Date  .  RIGHT LOBE HEPATECTOMY  06-12-2013    [facility]   benign mass  . CARDIOVASCULAR STRESS TEST  10-01-2014   dr hochrein   normal perfusion study, no ischemia,  normal LV function and wall motion, ef 70%  . COLONOSCOPY    . EUS N/A 02/10/2015   Procedure: LOWER ENDOSCOPIC ULTRASOUND (EUS);  Surgeon: Milus Banister, MD;  Location: [facility];  Service: Endoscopy;  Laterality: N/A;  . KNEE ARTHROSCOPY Bilateral 1990's  . LUMBAR FUSION  2001  . PENILE PROSTHESIS IMPLANT N/A 04/25/2015   Procedure: PENILE PROSTHESIS THREE PIECE INFLATABLE( COLOPLAST) SCROTAL APPROACH;  Surgeon: Kathie Rhodes, MD;  Location: [facility] [facility];  Service: Urology;  Laterality: N/A;  . POLYPECTOMY    . RADIOACTIVE PROSTATE SEED IMPLANTS  May 2014  in [place], [place] Right 02/21/2016  . TOTAL KNEE ARTHROPLASTY Left 10- 2013  . TRANSTHORACIC ECHOCARDIOGRAM  12-26-2012   mild focal basal septal  hypertrophy,  grade II diastolic dysfunction,  ef 55-60%,  mild LAE,  trivial MR and TR  . VASECTOMY  1980's w/ gen. anes.   Social History:  reports that he quit smoking about 16 years ago. His smoking use included Cigarettes. He has a 30.00 pack-year smoking history. He has never used smokeless tobacco. He reports that he drinks alcohol. He reports that he does not use drugs.  Allergies  Allergen Reactions  . Benazepril Hcl Cough  . Sildenafil Other (See Comments)    Unknown    Family History  Problem Relation Age of Onset  . Diabetes Mother   . Heart disease Mother     Pacemaker  . Pancreatic cancer Father   . Stomach  cancer Father   . Stomach cancer Sister   . Pancreatic cancer Sister   . Hypertension Other   . Colon cancer Other     according to info on colonoscopy  . Colon polyps Neg Hx   . Rectal cancer Neg Hx   . Esophageal cancer Neg Hx      Prior to Admission medications   Medication Sig Start Date End Date Taking? Authorizing Provider  amLODipine-olmesartan (AZOR) 10-40 MG per tablet Take 1 tablet by mouth daily. Patient taking differently: Take 1 tablet by mouth every morning.  10/25/14  Yes Minus Breeding, MD  aspirin EC 81 MG tablet Take 81 mg by mouth daily.   Yes Historical Provider, MD  Carboxymethylcellulose Sodium (REFRESH LIQUIGEL OP) Apply 2 drops to eye as needed (for dry eyes).    Yes Historical Provider, MD  fluticasone (FLONASE) 50 MCG/ACT nasal spray Place 1 spray into both nostrils daily as needed for allergies or rhinitis.   Yes Historical Provider, MD  ibuprofen (ADVIL,MOTRIN) 800 MG tablet Take 800 mg by mouth every 8 (eight) hours as needed for moderate pain.    Yes Historical Provider, MD  Iron-Vitamins (GERITOL COMPLETE PO) Take 1 tablet by mouth every morning. With Iron   Yes Historical Provider, MD  metoprolol (LOPRESSOR) 50 MG tablet Take 50 mg by mouth daily.    Yes Historical Provider, MD  pioglitazone-metformin (ACTOPLUS MET) 15-850 MG tablet TAKE 1 TABLET BY MOUTH TWICE A DAY WITH MEAL 09/01/15  Yes Evie Lacks Plotnikov, MD  pravastatin (PRAVACHOL) 40 MG tablet TAKE ONE TABLET BY MOUTH EACH EVENING 11/22/15  Yes Minus Breeding, MD  tamsulosin (FLOMAX) 0.4 MG CAPS capsule Take 0.4 mg by mouth every morning.    Yes Historical Provider, MD  amLODipine-olmesartan (AZOR) 10-40 MG tablet TAKE 1 TABLET BY MOUTH DAILY Patient not taking: Reported on 06/22/2016 05/25/16   Cassandria Anger, MD  cholecalciferol (VITAMIN D) 1000 UNITS tablet Take 1 tablet (1,000 Units total) by mouth daily. Patient not taking: Reported on 06/22/2016 08/10/15 08/09/16  Evie Lacks Plotnikov, MD    fluticasone (FLONASE) 50 MCG/ACT nasal spray USE ONE SPRAY IN [facility] NOSTRIL DAILY Patient not taking: Reported on 06/22/2016 06/08/16   Cassandria Anger, MD  furosemide (LASIX) 20 MG tablet Take 1-2 tablets (20-40 mg total) by mouth daily as needed for edema. 07/13/14   Evie Lacks Plotnikov, MD  montelukast (SINGULAIR) 10 MG tablet Take 1 tablet (10 mg total) by mouth daily. Patient not taking: Reported on 06/22/2016 02/28/16   Cassandria Anger, MD   Physical Exam: Vitals:   06/22/16 1145 06/22/16 1200 06/22/16 1215 06/22/16 1230  BP: 134/68 132/65 133/71 129/74  Pulse: 79 69 75 70  Resp: '16 16 17 16  ' SpO2:  100% 98% 97% 100%  Weight:      Height:        Wt Readings from Last 3 Encounters:  06/22/16 117.9 kg (260 lb)  02/15/16 126.6 kg (279 lb)  02/01/16 126.8 kg (279 lb 9.6 oz)    General:  Appears calm and comfortable Eyes:  PERRL, EOMI, normal lids, iris ENT:  grossly normal hearing, lips & tongue Neck:  no LAD, masses or thyromegaly Cardiovascular:  RRR, no m/r/g. No LE edema.  Respiratory:  CTA bilaterally, no w/r/r. Normal respiratory effort. Abdomen:  soft, nondistended, generalized tenderness, no rigidity or rebound Skin:  no rash or induration seen on limited exam Musculoskeletal:  grossly normal tone BUE/BLE Psychiatric:  grossly normal mood and affect, speech fluent and appropriate Neurologic:  CN 2-12 grossly intact, moves all extremities in coordinated fashion.          Labs on Admission:  Basic Metabolic Panel:  Recent Labs Lab 06/22/16 1054  NA 144  K 3.4*  CL 113*  CO2 24  GLUCOSE 122*  BUN 31*  CREATININE 0.92  CALCIUM 8.9   Liver Function Tests:  Recent Labs Lab 06/22/16 1054  AST 19  ALT 16*  ALKPHOS 55  BILITOT 0.2*  PROT 6.9  ALBUMIN 3.4*   No results for input(s): LIPASE, AMYLASE in the last 168 hours. No results for input(s): AMMONIA in the last 168 hours. CBC:  Recent Labs Lab 06/22/16 1054  WBC 7.2  HGB 10.3*  HCT  32.8*  MCV 88.6  PLT 297   Cardiac Enzymes: No results for input(s): CKTOTAL, CKMB, CKMBINDEX, TROPONINI in the last 168 hours.  BNP (last 3 results) No results for input(s): BNP in the last 8760 hours.  ProBNP (last 3 results) No results for input(s): PROBNP in the last 8760 hours.   Serum creatinine: 0.92 mg/dL 06/22/16 1054 Estimated creatinine clearance: 107.8 mL/min  CBG: No results for input(s): GLUCAP in the last 168 hours.  Radiological Exams on Admission: No results found.  EKG: no new  Assessment/Plan Principal Problem:   Melena Active Problems:   DM (diabetes mellitus), type 2 with complications (HCC)   Dyslipidemia   HYPOKALEMIA   Essential hypertension   COPD (chronic obstructive pulmonary disease) (HCC)   Gout   PAF (paroxysmal atrial fibrillation) (H[facility])   Melena 13.5 baseline ->10.3 on admit Serial Hgb every 4hours Pt has already be type and screen IVF Hold ASA NPO NPO after midnight GI consult, paged out, advised defer Ct for now Getting 2 view abd XR  Paroxysmal A. fib Normal sinus rhythm on telemetry Cont lopressor 33m  Hypertension When necessary hydralazine 10 mg IV as needed for severe blood pressure Hold Azor Hold lasix  Hyperlipidemia Continue statin  BPH Cont flomax  COPD Albuterol q2 prn  Low K 3.4 on admit Kdur 418m ordered Check Mg  Gout, controlled No acute flare, will monitor   Code Status: DNR/DNI DVT Prophylaxis: SCD Family Communication: Husband to notify wife Disposition Plan: Pending Improvement    PhElwin MochaMD Family Medicine [facility] [email or web address] Password [email or web address]

## 2016-06-22 NOTE — Consult Note (Signed)
 Referring Provider: EDP, Dr. Hobbs Primary Care Physician:  Alex Plotnikov, MD Primary Gastroenterologist:  Dr. Perry   Reason for Consultation:  Melena, heme positive stool; anemia  HPI: [name] [name] is a 65 y.o. male with past medical history of hypertension, sleep apnea, paroxysmal A. fib, diabetes, and rectal cancer.  Who presented to the emergency room this morning with chief complaint of black stools. Patient states that he is doing well. Overnight he have to go to the bathroom and had a very large bowel movement. He examined the toilet paper after he wiped and saw that it was black. He had a large amount of black stool. This alarmed him and he came to the emergency room.  Patient states he has no history of melena.  Says that he takes an ASA 81 mg daily and has been taking some Ibuprofen twice a week or so since his knee replacement in June.  Not on a PPI at home as he has no history of GERD, etc.  Emergency room: In the emergency room patient got a digital rectal exam that showed black stool, guaiac positive. Patient was typed and screened. Hemoglobin 10.3 grams, which was down from 13.5 grams ten months ago.  Patient hemodynamically stable.  INR 1.09.  BUN elevated to 31.  Has been placed on pantoprazole 40 mg IV BID.    Past Medical History:  Diagnosis Date  . Allergy   . Bilateral renal cysts    benign  . COPD (chronic obstructive pulmonary disease) ([facility])     PFT 2010 due to previous 20y h/o smoking  . DDD (degenerative disc disease), lumbar   . Diabetes mellitus type II   . ED (erectile dysfunction) of organic origin   . First degree heart block   . History of colon polyps   . History of prostate cancer current PSA per pt 0.13   dx March 2014---  s/p radiactive prostate seed implants and intraop radiotherapy in [place], [place]  . Hyperlipidemia   . Hypertension   . OSA on CPAP   . Osteoarthritis   . PAF (paroxysmal atrial fibrillation) ([facility])    cardiologist--  dr  hochrein  (episode prior to liver surgery at [place] 2014 w/ post-op RVR)  . Prostate cancer ([facility]) 11-11-2012   s/p chemo, radiation   . Rectal cancer ([facility])    01-25-2015----per pathology --  complete rectal polypectomy-- showed  invasive carcinoma rising in tubular adnemoa high grade hyperplasia--  per dr jacobs per MRI  no other area seen -- will have colonoscopy done in 3 months  . Sleep apnea    wears cpap  . Wears glasses     Past Surgical History:  Procedure Laterality Date  .  RIGHT LOBE HEPATECTOMY  06-12-2013    [facility]   benign mass  . CARDIOVASCULAR STRESS TEST  10-01-2014   dr hochrein   normal perfusion study, no ischemia,  normal LV function and wall motion, ef 70%  . COLONOSCOPY    . EUS N/A 02/10/2015   Procedure: LOWER ENDOSCOPIC ULTRASOUND (EUS);  Surgeon: Daniel P Jacobs, MD;  Location: [facility];  Service: Endoscopy;  Laterality: N/A;  . KNEE ARTHROSCOPY Bilateral 1990's  . LUMBAR FUSION  2001  . PENILE PROSTHESIS IMPLANT N/A 04/25/2015   Procedure: PENILE PROSTHESIS THREE PIECE INFLATABLE( COLOPLAST) SCROTAL APPROACH;  Surgeon: Mark Ottelin, MD;  Location: [facility];  Service: Urology;  Laterality: N/A;  . POLYPECTOMY    . RADIOACTIVE PROSTATE   SEED IMPLANTS  May 2014  in [place], [place]  . REPLACEMENT TOTAL KNEE Right 02/21/2016  . TOTAL KNEE ARTHROPLASTY Left 10- 2013  . TRANSTHORACIC ECHOCARDIOGRAM  12-26-2012   mild focal basal septal hypertrophy,  grade II diastolic dysfunction,  ef 55-60%,  mild LAE,  trivial MR and TR  . VASECTOMY  1980's w/ gen. anes.    Prior to Admission medications   Medication Sig Start Date End Date Taking? Authorizing Provider  amLODipine-olmesartan (AZOR) 10-40 MG per tablet Take 1 tablet by mouth daily. Patient taking differently: Take 1 tablet by mouth every morning.  10/25/14  Yes James Hochrein, MD  aspirin EC 81 MG tablet Take 81 mg by mouth daily.   Yes Historical Provider, MD  Carboxymethylcellulose Sodium  (REFRESH LIQUIGEL OP) Apply 2 drops to eye as needed (for dry eyes).    Yes Historical Provider, MD  fluticasone (FLONASE) 50 MCG/ACT nasal spray Place 1 spray into both nostrils daily as needed for allergies or rhinitis.   Yes Historical Provider, MD  ibuprofen (ADVIL,MOTRIN) 800 MG tablet Take 800 mg by mouth every 8 (eight) hours as needed for moderate pain.    Yes Historical Provider, MD  Iron-Vitamins (GERITOL COMPLETE PO) Take 1 tablet by mouth every morning. With Iron   Yes Historical Provider, MD  metoprolol (LOPRESSOR) 50 MG tablet Take 50 mg by mouth daily.    Yes Historical Provider, MD  pioglitazone-metformin (ACTOPLUS MET) 15-850 MG tablet TAKE 1 TABLET BY MOUTH TWICE A DAY WITH MEAL 09/01/15  Yes Aleksei V Plotnikov, MD  pravastatin (PRAVACHOL) 40 MG tablet TAKE ONE TABLET BY MOUTH EACH EVENING 11/22/15  Yes James Hochrein, MD  tamsulosin (FLOMAX) 0.4 MG CAPS capsule Take 0.4 mg by mouth every morning.    Yes Historical Provider, MD  furosemide (LASIX) 20 MG tablet Take 1-2 tablets (20-40 mg total) by mouth daily as needed for edema. 07/13/14   Aleksei V Plotnikov, MD    Current Facility-Administered Medications  Medication Dose Route Frequency Provider Last Rate Last Dose  . pantoprazole (PROTONIX) injection 40 mg  40 mg Intravenous Q12H Ian D McKeag, MD   40 mg at 06/22/16 1338   Current Outpatient Prescriptions  Medication Sig Dispense Refill  . amLODipine-olmesartan (AZOR) 10-40 MG per tablet Take 1 tablet by mouth daily. (Patient taking differently: Take 1 tablet by mouth every morning. ) 90 tablet 3  . aspirin EC 81 MG tablet Take 81 mg by mouth daily.    . Carboxymethylcellulose Sodium (REFRESH LIQUIGEL OP) Apply 2 drops to eye as needed (for dry eyes).     . fluticasone (FLONASE) 50 MCG/ACT nasal spray Place 1 spray into both nostrils daily as needed for allergies or rhinitis.    . ibuprofen (ADVIL,MOTRIN) 800 MG tablet Take 800 mg by mouth every 8 (eight) hours as needed  for moderate pain.     . Iron-Vitamins (GERITOL COMPLETE PO) Take 1 tablet by mouth every morning. With Iron    . metoprolol (LOPRESSOR) 50 MG tablet Take 50 mg by mouth daily.     . pioglitazone-metformin (ACTOPLUS MET) 15-850 MG tablet TAKE 1 TABLET BY MOUTH TWICE A DAY WITH MEAL 180 tablet 3  . pravastatin (PRAVACHOL) 40 MG tablet TAKE ONE TABLET BY MOUTH EACH EVENING 90 tablet 3  . tamsulosin (FLOMAX) 0.4 MG CAPS capsule Take 0.4 mg by mouth every morning.     . furosemide (LASIX) 20 MG tablet Take 1-2 tablets (20-40 mg total) by mouth daily as needed   for edema. 60 tablet 5    Allergies as of 06/22/2016 - Review Complete 06/22/2016  Allergen Reaction Noted  . Benazepril hcl Cough 03/02/2008  . Sildenafil Other (See Comments)     Family History  Problem Relation Age of Onset  . Diabetes Mother   . Heart disease Mother     Pacemaker  . Pancreatic cancer Father   . Stomach cancer Father   . Stomach cancer Sister   . Pancreatic cancer Sister   . Hypertension Other   . Colon cancer Other     according to info on colonoscopy  . Colon polyps Neg Hx   . Rectal cancer Neg Hx   . Esophageal cancer Neg Hx     Social History   Social History  . Marital status: Married    Spouse name: N/A  . Number of children: 2  . Years of education: N/A   Occupational History  . [employer]     Retired  . teaching    Social History Main Topics  . Smoking status: Former Smoker    Packs/day: 2.00    Years: 15.00    Types: Cigarettes    Quit date: 09/04/1999  . Smokeless tobacco: Never Used  . Alcohol use 0.0 oz/week     Comment: Occasional  . Drug use: No  . Sexual activity: Not on file   Other Topics Concern  . Not on file   Social History Narrative   Regular exercise- yes, walking    Review of Systems: Ten point ROS is O/w negative except as mentioned in HPI.  Physical Exam: Vital signs in last 24 hours: Pulse Rate:  [69-79] 78 (10/20 1330) Resp:  [15-21] 21 (10/20 1330) BP:  (120-144)/(65-89) 144/89 (10/20 1330) SpO2:  [97 %-100 %] 100 % (10/20 1330) Weight:  [260 lb (117.9 kg)] 260 lb (117.9 kg) (10/20 0956)   General:  Alert, Well-developed, well-nourished, pleasant and cooperative in NAD Head:  Normocephalic and atraumatic. Eyes:  Sclera clear, no icterus.  Conjunctiva pink. Ears:  Normal auditory acuity. Mouth:  No deformity or lesions.   Lungs:  Clear throughout to auscultation.  No wheezes, crackles, or rhonchi.  Heart:  Regular rate and rhythm; no murmurs, clicks, rubs,  or gallops. Abdomen:  Soft, non-distended.  BS present.  Mild diffuse TTP.  Hard region below xiphoid noted (calcification seen on CT scan).  Scars noted from previous surgery. Rectal:  Deferred.  Heme positive in the ED.  Msk:  Symmetrical without gross deformities. Pulses:  Normal pulses noted. Extremities:  Without clubbing or edema. Neurologic:  Alert and oriented x 4;  grossly normal neurologically. Skin:  Intact without significant lesions or rashes. Psych:  Alert and cooperative. Normal mood and affect.  Intake/Output this shift: Total I/O In: 1000 [IV Piggyback:1000] Out: 400 [Urine:400]  Lab Results:  Recent Labs  06/22/16 1054  WBC 7.2  HGB 10.3*  HCT 32.8*  PLT 297   BMET  Recent Labs  06/22/16 1054  NA 144  K 3.4*  CL 113*  CO2 24  GLUCOSE 122*  BUN 31*  CREATININE 0.92  CALCIUM 8.9   LFT  Recent Labs  06/22/16 1054  PROT 6.9  ALBUMIN 3.4*  AST 19  ALT 16*  ALKPHOS 55  BILITOT 0.2*   PT/INR  Recent Labs  06/22/16 1054  LABPROT 14.2  INR 1.09   IMPRESSION:  -Black, heme positive stools x 2 since last night:  Hgb down 3 grams compared to several months   ago.  Some recent NSAID use.  Not on PPI at home. -Rectal cancer s/p resection in 2016.  Colonoscopy 02/2016 ok but with some adenomatous polyp at the resection site.  Due for another flex sig 08/2016 with Dr. Perry. -S/p right hepatic lobectomy for benign lesion  PLAN: -Clear  liquids then NPO after midnight. -EGD 10/21. -Monitor Hgb and transfuse if needed. -Agree with pantoprazole 40 mg IV BID.   Andrew Blasius D.  06/22/2016, 3:23 PM  Pager number [phone]     

## 2016-06-22 NOTE — ED Provider Notes (Signed)
I saw and evaluated the patient, reviewed the resident's note and I agree with the findings and plan.   EKG Interpretation None     Patient here complaining of dark stool mixed with blood which began this morning. Denies any prior history of GI bleed. Did have a prior liver tumor which was removed. No history of taking blood thinners. Blood work will be obtained. Patient likely to be admitted   Lacretia Leigh, MD 06/22/16 1025

## 2016-06-22 NOTE — ED Provider Notes (Signed)
[facility] MED RESIDENT Provider Note   CSN: [id number] Arrival date & time: 06/22/16  0946     History   Chief Complaint Chief Complaint  Patient presents with  . Melena    HPI [name] [name] is a 65 y.o. male with a past medical history significant for type 2 diabetes, dyslipidemia, hypertension, COPD, OSA, prostate cancer, hematochezia, and paroxysmal A. fib. Patient is presenting with complaints of melena. He states that yesterday afternoon he had acute onset lightheadedness, fatigue, generalized weakness, and diaphoresis. He states shortly thereafter he had a bowel movement that was grossly black and watery. Upon wiping he had grossly black stool on the paper. Since that episode he has had persistent generalized weakness and fatigue. He has some slight abdominal discomfort. He denies any nausea, hematemesis, or vomiting.   Patient is a past medical history significant for hematochezia. He states that he has been undergoing colonoscopies every 6 months due to a previous large polyp removal requiring monitoring. He denies any hematochezia or melena over the past year. He denies any significant alcohol use. He is a previous smoker with a <10-pack-year history, and quit many years ago. Patient denies any significant coffee/caffeine consumption. No regular NSAID use.  HPI  Past Medical History:  Diagnosis Date  . Allergy   . Bilateral renal cysts    benign  . COPD (chronic obstructive pulmonary disease) ([place])     PFT 2010 due to previous 20y h/o smoking  . DDD (degenerative disc disease), lumbar   . Diabetes mellitus type II   . ED (erectile dysfunction) of organic origin   . First degree heart block   . History of colon polyps   . History of prostate cancer current PSA per pt 0.13   dx March 2014---  s/p radiactive prostate seed implants and intraop radiotherapy in [place], [place]  . Hyperlipidemia   . Hypertension   . OSA on CPAP   . Osteoarthritis   . PAF (paroxysmal atrial  fibrillation) [facility])    cardiologist--  dr hochrein  (episode prior to liver surgery at [facility] 2014 w/ post-op RVR)  . Prostate cancer ([place]) 11-11-2012   s/p chemo, radiation   . Rectal cancer ([place])    01-25-2015----per pathology --  complete rectal polypectomy-- showed  invasive carcinoma rising in tubular adnemoa high grade hyperplasia--  per dr Ardis Hughs per MRI  no other area seen -- will have colonoscopy done in 3 months  . Sleep apnea    wears cpap  . Wears glasses     Patient Active Problem List   Diagnosis Date Noted  . Melena 06/22/2016  . Fatigue 08/10/2015  . Erectile dysfunction 04/25/2015  . Anemia due to chronic blood loss 12/21/2014  . GI bleed 12/21/2014  . Chest pain at rest 09/30/2014  . Edema 07/13/2014  . Allergic rhinitis 03/17/2014  . PAF (paroxysmal atrial fibrillation) ([place]) 05/25/2013  . Cardiomegaly 12/12/2012  . Liver mass 12/12/2012  . Prostate cancer ([place]) 12/11/2012  . Hepatic cyst 12/11/2012  . Hematuria, gross 09/05/2012  . Knee osteoarthritis 09/05/2012  . OSA on CPAP 09/05/2012  . Snoring 09/07/2011  . Paresthesia of left arm 07/06/2011  . Gout 03/13/2011  . Well adult exam 03/03/2011  . Arthritis of knee 03/03/2011  . MENTAL CONFUSION 03/09/2010  . GAIT DISTURBANCE 03/09/2010  . HEAT STROKE 03/09/2010  . FOOT PAIN 11/08/2009  . CHEST PAIN UNSPECIFIED 08/19/2009  . COPD (chronic obstructive pulmonary disease) ([place]) 07/08/2009  . TOBACCO USE, QUIT  07/08/2009  . RECTAL BLEEDING 12/07/2008  . PERSONAL HISTORY OF COLONIC POLYPS 12/07/2008  . Dyslipidemia 03/02/2008  . HEMATOCHEZIA 03/02/2008  . COUGH 03/02/2008  . HYPOKALEMIA 02/03/2008  . Acute sinusitis 02/03/2008  . SYNCOPE 02/03/2008  . DM (diabetes mellitus), type 2 with complications ([place]) 75/17/0017  . Pain in joint, shoulder region 11/27/2007  . ERECTILE DYSFUNCTION 10/30/2007  . Essential hypertension 10/30/2007  . OSTEOARTHRITIS 10/30/2007  . LOW BACK PAIN 10/30/2007     Past Surgical History:  Procedure Laterality Date  .  RIGHT LOBE HEPATECTOMY  06-12-2013    [facility]   benign mass  . CARDIOVASCULAR STRESS TEST  10-01-2014   dr hochrein   normal perfusion study, no ischemia,  normal LV function and wall motion, ef 70%  . COLONOSCOPY    . ESOPHAGOGASTRODUODENOSCOPY N/A 06/23/2016   Procedure: ESOPHAGOGASTRODUODENOSCOPY (EGD);  Surgeon: Manus Gunning, MD;  Location: [place];  Service: Gastroenterology;  Laterality: N/A;  . EUS N/A 02/10/2015   Procedure: LOWER ENDOSCOPIC ULTRASOUND (EUS);  Surgeon: Milus Banister, MD;  Location: [facility];  Service: Endoscopy;  Laterality: N/A;  . KNEE ARTHROSCOPY Bilateral 1990's  . LUMBAR FUSION  2001  . PENILE PROSTHESIS IMPLANT N/A 04/25/2015   Procedure: PENILE PROSTHESIS THREE PIECE INFLATABLE( COLOPLAST) SCROTAL APPROACH;  Surgeon: Kathie Rhodes, MD;  Location: [facility];  Service: Urology;  Laterality: N/A;  . POLYPECTOMY    . RADIOACTIVE PROSTATE SEED IMPLANTS  May 2014  in [place] Right 02/21/2016  . TOTAL KNEE ARTHROPLASTY Left 10- 2013  . TRANSTHORACIC ECHOCARDIOGRAM  12-26-2012   mild focal basal septal hypertrophy,  grade II diastolic dysfunction,  ef 55-60%,  mild LAE,  trivial MR and TR  . VASECTOMY  1980's w/ gen. anes.       Home Medications    Prior to Admission medications   Medication Sig Start Date End Date Taking? Authorizing Provider  amLODipine-olmesartan (AZOR) 10-40 MG per tablet Take 1 tablet by mouth daily. Patient taking differently: Take 1 tablet by mouth every morning.  10/25/14  Yes Minus Breeding, MD  aspirin EC 81 MG tablet Take 81 mg by mouth daily.   Yes Historical Provider, MD  Carboxymethylcellulose Sodium (REFRESH LIQUIGEL OP) Apply 2 drops to eye as needed (for dry eyes).    Yes Historical Provider, MD  fluticasone (FLONASE) 50 MCG/ACT nasal spray Place 1 spray into both nostrils daily as needed for allergies  or rhinitis.   Yes Historical Provider, MD  Iron-Vitamins (GERITOL COMPLETE PO) Take 1 tablet by mouth every morning. With Iron   Yes Historical Provider, MD  metoprolol (LOPRESSOR) 50 MG tablet Take 50 mg by mouth daily.    Yes Historical Provider, MD  pioglitazone-metformin (ACTOPLUS MET) 15-850 MG tablet TAKE 1 TABLET BY MOUTH TWICE A DAY WITH MEAL 09/01/15  Yes Evie Lacks Plotnikov, MD  pravastatin (PRAVACHOL) 40 MG tablet TAKE ONE TABLET BY MOUTH EACH EVENING 11/22/15  Yes Minus Breeding, MD  tamsulosin (FLOMAX) 0.4 MG CAPS capsule Take 0.4 mg by mouth every morning.    Yes Historical Provider, MD  acetaminophen-codeine (TYLENOL #3) 300-30 MG tablet Take 1 tablet by mouth every 6 (six) hours as needed for moderate pain. 06/23/16   Costin Karlyne Greenspan, MD  furosemide (LASIX) 20 MG tablet Take 1-2 tablets (20-40 mg total) by mouth daily as needed for edema. 07/13/14   Evie Lacks Plotnikov, MD  pantoprazole (PROTONIX) 40 MG tablet Take 1 tablet (40  mg total) by mouth 2 (two) times daily before a meal. Twice daily for 4 weeks then once daily 06/23/16   Costin Karlyne Greenspan, MD    Family History Family History  Problem Relation Age of Onset  . Diabetes Mother   . Heart disease Mother     Pacemaker  . Pancreatic cancer Father   . Stomach cancer Father   . Stomach cancer Sister   . Pancreatic cancer Sister   . Hypertension Other   . Colon cancer Other     according to info on colonoscopy  . Colon polyps Neg Hx   . Rectal cancer Neg Hx   . Esophageal cancer Neg Hx     Social History Social History  Substance Use Topics  . Smoking status: Former Smoker    Packs/day: 2.00    Years: 15.00    Types: Cigarettes    Quit date: 09/04/1999  . Smokeless tobacco: Never Used  . Alcohol use 0.0 oz/week     Comment: Occasional     Allergies   Benazepril hcl and Sildenafil   Review of Systems Review of Systems  Constitutional: Positive for diaphoresis and fatigue. Negative for activity change,  appetite change, chills, fever and unexpected weight change.  HENT: Negative for congestion and nosebleeds.   Respiratory: Positive for shortness of breath. Negative for cough, chest tightness, wheezing and stridor.   Cardiovascular: Negative for chest pain, palpitations and leg swelling.  Gastrointestinal: Positive for blood in stool, diarrhea and nausea. Negative for abdominal distention, abdominal pain, anal bleeding, constipation, rectal pain and vomiting.  Genitourinary: Negative for dysuria and hematuria.  Musculoskeletal: Negative for arthralgias, back pain, joint swelling and myalgias.  Skin: Positive for pallor. Negative for rash and wound.  Neurological: Positive for dizziness, weakness and light-headedness. Negative for tremors, seizures, syncope, numbness and headaches.  Hematological: Does not bruise/bleed easily.  Psychiatric/Behavioral: Negative for agitation, behavioral problems and confusion.     Physical Exam Updated Vital Signs BP 129/61 (BP Location: Right Arm)   Pulse 74   Temp 98.2 F (36.8 C) (Oral)   Resp 12   Ht _0  (1.88 m)   Wt 260 lb (117.9 kg)   SpO2 100%   BMI 33.38 kg/m   Physical Exam  Constitutional: He is oriented to person, place, and time. He appears well-developed and well-nourished. No distress.  HENT:  Head: Normocephalic and atraumatic.  Mouth/Throat: Oropharynx is clear and moist.  Eyes: Conjunctivae and EOM are normal. Pupils are equal, round, and reactive to light. No scleral icterus.  Neck: Normal range of motion. Neck supple.  Cardiovascular: Normal rate, regular rhythm, normal heart sounds and intact distal pulses.   No murmur heard. Pulmonary/Chest: Effort normal and breath sounds normal. No respiratory distress. He has no wheezes. He has no rales. He exhibits no tenderness.  Abdominal: Soft. He exhibits no distension, no fluid wave and no mass. Bowel sounds are increased. There is no hepatosplenomegaly. There is tenderness in the  right upper quadrant, right lower quadrant and epigastric area. There is no rigidity, no rebound, no guarding and negative Murphy's sign.  Genitourinary: Rectum normal and prostate normal.  Musculoskeletal: Normal range of motion. He exhibits no tenderness.  Neurological: He is alert and oriented to person, place, and time. No cranial nerve deficit. He exhibits normal muscle tone.  Skin: Skin is warm and dry. Capillary refill takes 2 to 3 seconds. No rash noted. He is not diaphoretic. There is pallor.  Psychiatric: He has a normal  mood and affect. His behavior is normal. Thought content normal.     ED Treatments / Results  Labs (all labs ordered are listed, but only abnormal results are displayed) Labs Reviewed  COMPREHENSIVE METABOLIC PANEL - Abnormal; Notable for the following:       Result Value   Potassium 3.4 (*)    Chloride 113 (*)    Glucose, Bld 122 (*)    BUN 31 (*)    Albumin 3.4 (*)    ALT 16 (*)    Total Bilirubin 0.2 (*)    All other components within normal limits  CBC - Abnormal; Notable for the following:    RBC 3.70 (*)    Hemoglobin 10.3 (*)    HCT 32.8 (*)    All other components within normal limits  BASIC METABOLIC PANEL - Abnormal; Notable for the following:    Potassium 3.3 (*)    Chloride 112 (*)    Glucose, Bld 107 (*)    Calcium 8.1 (*)    Anion gap 4 (*)    All other components within normal limits  CBC - Abnormal; Notable for the following:    RBC 3.04 (*)    Hemoglobin 8.7 (*)    HCT 27.0 (*)    All other components within normal limits  HEMOGLOBIN AND HEMATOCRIT, BLOOD - Abnormal; Notable for the following:    Hemoglobin 9.4 (*)    HCT 29.1 (*)    All other components within normal limits  HEMOGLOBIN AND HEMATOCRIT, BLOOD - Abnormal; Notable for the following:    Hemoglobin 8.9 (*)    HCT 28.0 (*)    All other components within normal limits  HEMOGLOBIN AND HEMATOCRIT, BLOOD - Abnormal; Notable for the following:    Hemoglobin 9.2 (*)     HCT 28.7 (*)    All other components within normal limits  GLUCOSE, CAPILLARY - Abnormal; Notable for the following:    Glucose-Capillary 118 (*)    All other components within normal limits  GLUCOSE, CAPILLARY - Abnormal; Notable for the following:    Glucose-Capillary 102 (*)    All other components within normal limits  GLUCOSE, CAPILLARY - Abnormal; Notable for the following:    Glucose-Capillary 116 (*)    All other components within normal limits  GLUCOSE, CAPILLARY - Abnormal; Notable for the following:    Glucose-Capillary 110 (*)    All other components within normal limits  POC OCCULT BLOOD, ED - Abnormal; Notable for the following:    Fecal Occult Bld POSITIVE (*)    All other components within normal limits  PROTIME-INR  MAGNESIUM  PROTIME-INR  GLUCOSE, CAPILLARY  TYPE AND SCREEN  ABO/RH  SURGICAL PATHOLOGY    EKG  EKG Interpretation None       Radiology No results found.  Procedures Procedures (including critical care time)  Medications Ordered in ED Medications  sodium chloride 0.9 % bolus 1,000 mL (0 mLs Intravenous Stopped 06/22/16 1240)  potassium chloride SA (K-DUR,KLOR-CON) CR tablet 40 mEq (40 mEq Oral Given 06/22/16 2123)  pneumococcal 23 valent vaccine (PNU-IMMUNE) injection 0.5 mL (0.5 mLs Intramuscular Given 06/23/16 0947)     Initial Impression / Assessment and Plan / ED Course  I have reviewed the triage vital signs and the nursing notes.  Pertinent labs & imaging results that were available during my care of the patient were reviewed by me and considered in my medical decision making (see chart for details).  Clinical Course   Melena, concern  for upper GI bleed: Patient presented with symptoms consistent with melena. Source currently unknown at this time but upper GI bleed seems likely as patient is experiencing epigastric and RUQ pain. Initial labs showed a hemoglobin of 10.3 (down from 13.5 ~10 months ago), MCV 88.6. Kidney function  at baseline, BUN 31 (deemed appropriately elevated for RBC digestion), coagulation studies normal. - decision made to admit.  Final Clinical Impressions(s) / ED Diagnoses   Final diagnoses:  Abdominal pain    New Prescriptions Discharge Medication List as of 06/23/2016  3:42 PM    START taking these medications   Details  acetaminophen-codeine (TYLENOL #3) 300-30 MG tablet Take 1 tablet by mouth every 6 (six) hours as needed for moderate pain., Starting Sat 06/23/2016, Print    pantoprazole (PROTONIX) 40 MG tablet Take 1 tablet (40 mg total) by mouth 2 (two) times daily before a meal. Twice daily for 4 weeks then once daily, Starting Sat 06/23/2016, Print

## 2016-06-22 NOTE — Telephone Encounter (Signed)
Patient Name: [name] [name]  DOB: Sep 22, 1950    Initial Comment Caller states he had a flu shot yesterday, got very weak. When that happened he had a bowel movement and it was very black.   Nurse Assessment  Nurse: Raphael Gibney, RN, Vanita Ingles Date/Time (Eastern Time): 06/22/2016 7:51:55 AM  Confirm and document reason for call. If symptomatic, describe symptoms. You must click the next button to save text entered. ---Caller states he received his flu vaccine yesterday. last night, he got very weak and had a watery black stool. BP 193/101 sitting. standing 156/93 this am. No pain.  Has the patient traveled out of the country within the last 30 days? ---Not Applicable  Does the patient have any new or worsening symptoms? ---Yes  Will a triage be completed? ---Yes  Related visit to physician within the last 2 weeks? ---No  Does the PT have any chronic conditions? (i.e. diabetes, asthma, etc.) ---Yes  List chronic conditions. ---diabetes; HTN  Is this a behavioral health or substance abuse call? ---No     Guidelines    Guideline Title Affirmed Question Affirmed Notes  Rectal Bleeding Tarry or jet black-colored stool (not dark green)    Final Disposition User   Go to ED Now Raphael Gibney, RN, [facility] - ED   Disagree/Comply: Comply

## 2016-06-22 NOTE — ED Notes (Signed)
Resident at bedside - provided with occult stool card

## 2016-06-22 NOTE — Telephone Encounter (Signed)
Patient to ER  

## 2016-06-22 NOTE — ED Triage Notes (Signed)
Per pt: he had a dark stool last night around 10 pm. He states that he wiped again this morning the toilet paper was black. He states that he also got a flu shot yesterday morning. He said he felt nauseated and light headed.

## 2016-06-23 ENCOUNTER — Encounter (HOSPITAL_COMMUNITY)
Admission: EM | Disposition: A | Discharge status: Home / Self Care | Payer: Self-pay | Source: Home / Self Care | Attending: Emergency Medicine

## 2016-06-23 ENCOUNTER — Encounter (HOSPITAL_COMMUNITY): Payer: Self-pay

## 2016-06-23 DIAGNOSIS — K921 Melena: Secondary | ICD-10-CM | POA: Diagnosis not present

## 2016-06-23 HISTORY — PX: ESOPHAGOGASTRODUODENOSCOPY: SHX5428

## 2016-06-23 LAB — BASIC METABOLIC PANEL
ANION GAP: 4 — AB (ref 5–15)
BUN: 18 mg/dL (ref 6–20)
CALCIUM: 8.1 mg/dL — AB (ref 8.9–10.3)
CHLORIDE: 112 mmol/L — AB (ref 101–111)
CO2: 23 mmol/L (ref 22–32)
CREATININE: 0.79 mg/dL (ref 0.61–1.24)
GFR calc non Af Amer: >60 mL/min (ref >60)
GLUCOSE: 107 mg/dL — AB (ref 65–99)
Potassium: 3.3 mmol/L — ABNORMAL LOW (ref 3.5–5.1)
Sodium: 139 mmol/L (ref 135–145)

## 2016-06-23 LAB — PROTIME-INR
INR: 1.14
Prothrombin Time: 14.7 seconds (ref 11.4–15.2)

## 2016-06-23 LAB — HEMOGLOBIN AND HEMATOCRIT, BLOOD
HCT: 28.7 % — ABNORMAL LOW (ref 39.0–52.0)
HEMATOCRIT: 28 % — AB (ref 39.0–52.0)
HEMOGLOBIN: 9.2 g/dL — AB (ref 13.0–17.0)
Hemoglobin: 8.9 g/dL — ABNORMAL LOW (ref 13.0–17.0)

## 2016-06-23 LAB — GLUCOSE, CAPILLARY
GLUCOSE-CAPILLARY: 102 mg/dL — AB (ref 65–99)
GLUCOSE-CAPILLARY: 110 mg/dL — AB (ref 65–99)
GLUCOSE-CAPILLARY: 116 mg/dL — AB (ref 65–99)
GLUCOSE-CAPILLARY: 118 mg/dL — AB (ref 65–99)

## 2016-06-23 LAB — CBC
HCT: 27 % — ABNORMAL LOW (ref 39.0–52.0)
HEMOGLOBIN: 8.7 g/dL — AB (ref 13.0–17.0)
MCH: 28.6 pg (ref 26.0–34.0)
MCHC: 32.2 g/dL (ref 30.0–36.0)
MCV: 88.8 fL (ref 78.0–100.0)
Platelets: 241 10*3/uL (ref 150–400)
RBC: 3.04 MIL/uL — AB (ref 4.22–5.81)
RDW: 14.9 % (ref 11.5–15.5)
WBC: 6.1 10*3/uL (ref 4.0–10.5)

## 2016-06-23 SURGERY — EGD (ESOPHAGOGASTRODUODENOSCOPY)
Anesthesia: Moderate Sedation

## 2016-06-23 MED ORDER — SODIUM CHLORIDE 0.9 % IV SOLN
INTRAVENOUS | Status: DC
  Administered 2016-06-23: 500 mL via INTRAVENOUS

## 2016-06-23 MED ORDER — FENTANYL CITRATE (PF) 100 MCG/2ML IJ SOLN
INTRAMUSCULAR | Status: DC | PRN
  Administered 2016-06-23 (×3): 25 ug via INTRAVENOUS

## 2016-06-23 MED ORDER — DIPHENHYDRAMINE HCL 50 MG/ML IJ SOLN
INTRAMUSCULAR | Status: AC
  Filled 2016-06-23: qty 1

## 2016-06-23 MED ORDER — MIDAZOLAM HCL 5 MG/ML IJ SOLN
INTRAMUSCULAR | Status: AC
  Filled 2016-06-23: qty 2

## 2016-06-23 MED ORDER — MIDAZOLAM HCL 10 MG/2ML IJ SOLN
INTRAMUSCULAR | Status: DC | PRN
  Administered 2016-06-23 (×2): 2 mg via INTRAVENOUS
  Administered 2016-06-23: 1 mg via INTRAVENOUS

## 2016-06-23 MED ORDER — BUTAMBEN-TETRACAINE-BENZOCAINE 2-2-14 % EX AERO
INHALATION_SPRAY | CUTANEOUS | Status: DC | PRN
  Administered 2016-06-23: 2 via TOPICAL

## 2016-06-23 MED ORDER — FENTANYL CITRATE (PF) 100 MCG/2ML IJ SOLN
INTRAMUSCULAR | Status: AC
  Filled 2016-06-23: qty 4

## 2016-06-23 MED ORDER — DIPHENHYDRAMINE HCL 50 MG/ML IJ SOLN
INTRAMUSCULAR | Status: DC | PRN
  Administered 2016-06-23: 25 mg via INTRAVENOUS

## 2016-06-23 MED ORDER — PANTOPRAZOLE SODIUM 40 MG PO TBEC: 40.0000 mg | DELAYED_RELEASE_TABLET | Freq: Two times a day (BID) | ORAL | 0 refills | Status: DC

## 2016-06-23 MED ORDER — ACETAMINOPHEN-CODEINE #3 300-30 MG PO TABS: 1.0000 | ORAL_TABLET | Freq: Four times a day (QID) | ORAL | 0 refills | Status: DC | PRN

## 2016-06-23 NOTE — Discharge Summary (Signed)
Physician Discharge Summary  [name] [name] LTJ:[id number] DOB: 03-May-1951 DOA: 06/22/2016  PCP: Walker Kehr, MD  Admit date: 06/22/2016 Discharge date: 06/23/2016  Admitted From: home Disposition:  home  Recommendations for Outpatient Follow-up:  1. Follow up with Dr. [name] as scheduled 2. Follow up with Dr. Alain Marion in 2-3 weeks for repeat [place]: none Equipment/Devices: none  Discharge Condition: stable CODE STATUS: Partial (no intubation) Diet recommendation: regular  HPI: Per Dr Aggie Moats, [name] [name] is a 65 y.o. male with past medical history of hypertension, etc. sleep apnea, paroxysmal A. fib, diabetes and rectal cancer presents emergency room with chief complaint of black stools. Patient states that he is doing well. Overnight he have to go to the bathroom and had a very large bowel movement. He examined the toilet paper after he wiped and saw that it was black. He had a large amount of black stool. This alarmed him and he came to the emergency room, patient states he has no history of melena. Of note patient had a flu shot yesterday. Emergency room: In the emergency room patient got a digital rectal exam that showed black stool, guaiac positive. Patient was typed and screened. Hemoglobin had gone down from 13-10. Patient hemodynamically stable. CT abdomen and pelvis showed no acute abnormalities.  Hospital Course: Discharge Diagnoses:  Principal Problem:   Melena Active Problems:   DM (diabetes mellitus), type 2 with complications ([place])   Dyslipidemia   HYPOKALEMIA   Essential hypertension   COPD (chronic obstructive pulmonary disease) (HCC)   Gout   PAF (paroxysmal atrial fibrillation) ([place])   Melena - patient was admitted to the hospital with melena in the setting of recent use of significant doses of ibuprofen for his knee pain. Gastroenterology was consulted and have followed patient hospitalized. He underwent an EGD on 10/21 which showed  nonbleeding gastric ulcers with no stigmata of recent bleeding, these were biopsied. Discussed with Dr. Havery Moros, if patient tolerates diet he could potentially be discharged home on twice daily PPI and off of NSAIDs, and given clean-based ulcers he has a very low risk of rebleeding. Patient was able tolerate a diet, anxious to go home, and he was discharged in stable condition with prescriptions for Tylenol No. 3 for pain as well as PPI. His hemoglobin overall remains stable, it dipped as low as 8.7 however on repeat it started to improve at 9.2. He may benefit from repeat CBC in about a week. He is scheduled to have a colonoscopy with Dr. [name] this December for his history of polyps. Paroxysmal A. Fib - one remote episode following surgery, followed by cardiology as an outpatient, CHA2DS2-VASc Score for Stroke Risk is 3, on no anticoagulation per cardiology. This was a one time episode and has been taking sinus rhythm since. Continue Lopressor.  Hypertension - resume home medications  Hyperlipidemia - Continue statin BPH - Cont flomax COPD - stable, no wheezing  Gout - controlled Type 2 diabetes mellitus - most recent hemoglobin A1c was 5.9, CBGs were well controlled in the inpatient setting  Discharge Instructions     Medication List    STOP taking these medications   ibuprofen 800 MG tablet Commonly known as:  ADVIL,MOTRIN     TAKE these medications   acetaminophen-codeine 300-30 MG tablet Commonly known as:  TYLENOL #3 Take 1 tablet by mouth every 6 (six) hours as needed for moderate pain.   amLODipine-olmesartan 10-40 MG tablet Commonly known as:  AZOR Take 1 tablet  by mouth daily. What changed:  when to take this   aspirin EC 81 MG tablet Take 81 mg by mouth daily.   fluticasone 50 MCG/ACT nasal spray Commonly known as:  FLONASE Place 1 spray into both nostrils daily as needed for allergies or rhinitis.   furosemide 20 MG tablet Commonly known as:  LASIX Take 1-2  tablets (20-40 mg total) by mouth daily as needed for edema.   GERITOL COMPLETE PO Take 1 tablet by mouth every morning. With Iron   metoprolol 50 MG tablet Commonly known as:  LOPRESSOR Take 50 mg by mouth daily.   pantoprazole 40 MG tablet Commonly known as:  PROTONIX Take 1 tablet (40 mg total) by mouth 2 (two) times daily before a meal. Twice daily for 4 weeks then once daily   pioglitazone-metformin 15-850 MG tablet Commonly known as:  ACTOPLUS MET TAKE 1 TABLET BY MOUTH TWICE A DAY WITH MEAL   pravastatin 40 MG tablet Commonly known as:  PRAVACHOL TAKE ONE TABLET BY MOUTH EACH EVENING   REFRESH LIQUIGEL OP Apply 2 drops to eye as needed (for dry eyes).   tamsulosin 0.4 MG Caps capsule Commonly known as:  FLOMAX Take 0.4 mg by mouth every morning.      Follow-up Information    Walker Kehr, MD. Schedule an appointment as soon as possible for a visit in 2 week(s).   Specialty:  Internal Medicine Why:  for repeat CBC Contact information: [address] [place] [address] [phone]          Allergies  Allergen Reactions  . Benazepril Hcl Cough  . Sildenafil Other (See Comments)    Unknown    Consultations:  Gastroenterology  Procedures/Studies:  EGD 10/21  Dg Abd 2 Views  Result Date: 06/22/2016 CLINICAL DATA:  Nausea, lightheadedness EXAM: ABDOMEN - 2 VIEW COMPARISON:  CT abdomen 01/25/2015 FINDINGS: The bowel gas pattern is normal. There is no evidence of free air. No radio-opaque calculi or other significant radiographic abnormality is seen. Prostatic radiation seeds are noted. IMPRESSION: Negative. Electronically Signed   By: Kathreen Devoid   On: 06/22/2016 15:44      Subjective: - no chest pain, shortness of breath, no abdominal pain, nausea or vomiting.   Discharge Exam: Vitals:   06/23/16 1141 06/23/16 1328  BP: 128/60 129/61  Pulse: 73 74  Resp:    Temp: 98.4 F (36.9 C) 98.2 F (36.8 C)   Vitals:   06/23/16 1105 06/23/16  1115 06/23/16 1141 06/23/16 1328  BP:   128/60 129/61  Pulse:   73 74  Resp:      Temp:   98.4 F (36.9 C) 98.2 F (36.8 C)  TempSrc:   Oral Oral  SpO2: 100% 100% 98% 100%  Weight:      Height:        General: Pt is alert, awake, not in acute distress Cardiovascular: RRR, S1/S2 +, no rubs, no gallops Respiratory: CTA bilaterally, no wheezing, no rhonchi Abdominal: Soft, NT, ND, bowel sounds + Extremities: no edema, no cyanosis    The results of significant diagnostics from this hospitalization (including imaging, microbiology, ancillary and laboratory) are listed below for reference.     Microbiology: No results found for this or any previous visit (from the past 240 hour(s)).   Labs: BNP (last 3 results) No results for input(s): BNP in the last 8760 hours. Basic Metabolic Panel:  Recent Labs Lab 06/22/16 1054 06/22/16 1839 06/23/16 0236  NA 144  --  139  K 3.4*  --  3.3*  CL 113*  --  112*  CO2 24  --  23  GLUCOSE 122*  --  107*  BUN 31*  --  18  CREATININE 0.92  --  0.79  CALCIUM 8.9  --  8.1*  MG  --  2.1  --    Liver Function Tests:  Recent Labs Lab 06/22/16 1054  AST 19  ALT 16*  ALKPHOS 55  BILITOT 0.2*  PROT 6.9  ALBUMIN 3.4*   No results for input(s): LIPASE, AMYLASE in the last 168 hours. No results for input(s): AMMONIA in the last 168 hours. CBC:  Recent Labs Lab 06/22/16 1054 06/22/16 1839 06/22/16 2250 06/23/16 0236 06/23/16 0716  WBC 7.2  --   --  6.1  --   HGB 10.3* 9.4* 8.9* 8.7* 9.2*  HCT 32.8* 29.1* 28.0* 27.0* 28.7*  MCV 88.6  --   --  88.8  --   PLT 297  --   --  241  --    Cardiac Enzymes: No results for input(s): CKTOTAL, CKMB, CKMBINDEX, TROPONINI in the last 168 hours. BNP: Invalid input(s): POCBNP CBG:  Recent Labs Lab 06/22/16 1959 06/23/16 0012 06/23/16 0409 06/23/16 0749 06/23/16 1140  GLUCAP 97 118* 102* 116* 110*   D-Dimer No results for input(s): DDIMER in the last 72 hours. Hgb A1c No results  for input(s): HGBA1C in the last 72 hours. Lipid Profile No results for input(s): CHOL, HDL, LDLCALC, TRIG, CHOLHDL, LDLDIRECT in the last 72 hours. Thyroid function studies No results for input(s): TSH, T4TOTAL, T3FREE, THYROIDAB in the last 72 hours.  Invalid input(s): FREET3 Anemia work up No results for input(s): VITAMINB12, FOLATE, FERRITIN, TIBC, IRON, RETICCTPCT in the last 72 hours. Urinalysis    Component Value Date/Time   COLORURINE YELLOW 09/30/2014 [place] 09/30/2014 0347   LABSPEC 1.012 09/30/2014 0347   PHURINE 7.0 09/30/2014 0347   GLUCOSEU NEGATIVE 09/30/2014 0347   GLUCOSEU NEGATIVE 09/11/2012 0836   HGBUR NEGATIVE 09/30/2014 [place] 09/30/2014 0347   KETONESUR NEGATIVE 09/30/2014 0347   PROTEINUR NEGATIVE 09/30/2014 0347   UROBILINOGEN 0.2 09/30/2014 0347   NITRITE NEGATIVE 09/30/2014 0347   LEUKOCYTESUR NEGATIVE 09/30/2014 0347   Sepsis Labs Invalid input(s): PROCALCITONIN,  WBC,  LACTICIDVEN Microbiology No results found for this or any previous visit (from the past 240 hour(s)).   Time coordinating discharge: Over 30 minutes  SIGNED:  Marzetta Board, MD  [facility] 06/23/2016, 4:18 PM Pager [phone]  If 7PM-7AM, please contact night-coverage [email or web address] Password [email or web address]

## 2016-06-23 NOTE — Discharge Instructions (Signed)
Follow with [name] Kehr, MD in 5-7 days  Please get a complete blood count and chemistry panel checked by your Primary MD at your next visit, and again as instructed by your Primary MD. Please get your medications reviewed and adjusted by your Primary MD.  Please request your Primary MD to go over all Hospital Tests and Procedure/Radiological results at the follow up, please get all Hospital records sent to your Prim MD by signing hospital release before you go home.  If you had Pneumonia of Lung problems at the Hospital: Please get a 2 view Chest X ray done in 6-8 weeks after hospital discharge or sooner if instructed by your Primary MD.  If you have Congestive Heart Failure: Please call your Cardiologist or Primary MD anytime you have any of the following symptoms:  1) 3 pound weight gain in 24 hours or 5 pounds in 1 week  2) shortness of breath, with or without a dry hacking cough  3) swelling in the hands, feet or stomach  4) if you have to sleep on extra pillows at night in order to breathe  Follow cardiac low salt diet and 1.5 lit/day fluid restriction.  If you have diabetes Accuchecks 4 times/day, Once in AM empty stomach and then before each meal. Log in all results and show them to your primary doctor at your next visit. If any glucose reading is under 80 or above 300 call your primary MD immediately.  If you have Seizure/Convulsions/Epilepsy: Please do not drive, operate heavy machinery, participate in activities at heights or participate in high speed sports until you have seen by Primary MD or a Neurologist and advised to do so again.  If you had Gastrointestinal Bleeding: Please ask your Primary MD to check a complete blood count within one week of discharge or at your next visit. Your endoscopic/colonoscopic biopsies that are pending at the time of discharge, will also need to followed by your Primary MD.  Get Medicines reviewed and adjusted. Please take all your  medications with you for your next visit with your Primary MD  Please request your Primary MD to go over all hospital tests and procedure/radiological results at the follow up, please ask your Primary MD to get all Hospital records sent to his/her office.  If you experience worsening of your admission symptoms, develop shortness of breath, life threatening emergency, suicidal or homicidal thoughts you must seek medical attention immediately by calling 911 or calling your MD immediately  if symptoms less severe.  You must read complete instructions/literature along with all the possible adverse reactions/side effects for all the Medicines you take and that have been prescribed to you. Take any new Medicines after you have completely understood and accpet all the possible adverse reactions/side effects.   Do not drive or operate heavy machinery when taking Pain medications.   Do not take more than prescribed Pain, Sleep and Anxiety Medications  Special Instructions: If you have smoked or chewed Tobacco  in the last 2 yrs please stop smoking, stop any regular Alcohol  and or any Recreational drug use.  Wear Seat belts while driving.  Please note You were cared for by a hospitalist during your hospital stay. If you have any questions about your discharge medications or the care you received while you were in the hospital after you are discharged, you can call the unit and asked to speak with the hospitalist on call if the hospitalist that took care of you is not available. Once  you are discharged, your primary care physician will handle any further medical issues. Please note that NO REFILLS for any discharge medications will be authorized once you are discharged, as it is imperative that you return to your primary care physician (or establish a relationship with a primary care physician if you do not have one) for your aftercare needs so that they can reassess your need for medications and monitor your  lab values.  You can reach the hospitalist office at phone [phone] or fax [phone]   If you do not have a primary care physician, you can call [phone] for a physician referral.  Activity: As tolerated with Full fall precautions use [name]/cane & assistance as needed  Disposition Home

## 2016-06-23 NOTE — Interval H&P Note (Signed)
History and Physical Interval Note:  06/23/2016 10:50 AM  [name] [name]  has presented today for surgery, with the diagnosis of melena  The various methods of treatment have been discussed with the patient and family. After consideration of risks, benefits and other options for treatment, the patient has consented to  Procedure(s): ESOPHAGOGASTRODUODENOSCOPY (EGD) (N/A) as a surgical intervention .  The patient's history has been reviewed, patient examined, no change in status, stable for surgery.  I have reviewed the patient's chart and labs.  Questions were answered to the patient's satisfaction.     [name]

## 2016-06-23 NOTE — H&P (View-Only) (Signed)
Referring Provider: EDP, Dr. Aggie Moats Primary Care Physician:  Walker Kehr, MD Primary Gastroenterologist:  Dr. [name]   Reason for Consultation:  Melena, heme positive stool; anemia  HPI: [name] [name] is a 65 y.o. male with past medical history of hypertension, sleep apnea, paroxysmal A. fib, diabetes, and rectal cancer.  Who presented to the emergency room this morning with chief complaint of black stools. Patient states that he is doing well. Overnight he have to go to the bathroom and had a very large bowel movement. He examined the toilet paper after he wiped and saw that it was black. He had a large amount of black stool. This alarmed him and he came to the emergency room.  Patient states he has no history of melena.  Says that he takes an ASA 81 mg daily and has been taking some Ibuprofen twice a week or so since his knee replacement in June.  Not on a PPI at home as he has no history of GERD, etc.  Emergency room: In the emergency room patient got a digital rectal exam that showed black stool, guaiac positive. Patient was typed and screened. Hemoglobin 10.3 grams, which was down from 13.5 grams ten months ago.  Patient hemodynamically stable.  INR 1.09.  BUN elevated to 31.  Has been placed on pantoprazole 40 mg IV BID.    Past Medical History:  Diagnosis Date  . Allergy   . Bilateral renal cysts    benign  . COPD (chronic obstructive pulmonary disease) ([place])     PFT 2010 due to previous 20y h/o smoking  . DDD (degenerative disc disease), lumbar   . Diabetes mellitus type II   . ED (erectile dysfunction) of organic origin   . First degree heart block   . History of colon polyps   . History of prostate cancer current PSA per pt 0.13   dx March 2014---  s/p radiactive prostate seed implants and intraop radiotherapy in [place], [place]  . Hyperlipidemia   . Hypertension   . OSA on CPAP   . Osteoarthritis   . PAF (paroxysmal atrial fibrillation) [facility])    cardiologist--  dr  hochrein  (episode prior to liver surgery at [facility] 2014 w/ post-op RVR)  . Prostate cancer ([place]) 11-11-2012   s/p chemo, radiation   . Rectal cancer ([place])    01-25-2015----per pathology --  complete rectal polypectomy-- showed  invasive carcinoma rising in tubular adnemoa high grade hyperplasia--  per dr Ardis Hughs per MRI  no other area seen -- will have colonoscopy done in 3 months  . Sleep apnea    wears cpap  . Wears glasses     Past Surgical History:  Procedure Laterality Date  .  RIGHT LOBE HEPATECTOMY  06-12-2013    [facility]   benign mass  . CARDIOVASCULAR STRESS TEST  10-01-2014   dr hochrein   normal perfusion study, no ischemia,  normal LV function and wall motion, ef 70%  . COLONOSCOPY    . EUS N/A 02/10/2015   Procedure: LOWER ENDOSCOPIC ULTRASOUND (EUS);  Surgeon: Milus Banister, MD;  Location: [facility];  Service: Endoscopy;  Laterality: N/A;  . KNEE ARTHROSCOPY Bilateral 1990's  . LUMBAR FUSION  2001  . PENILE PROSTHESIS IMPLANT N/A 04/25/2015   Procedure: PENILE PROSTHESIS THREE PIECE INFLATABLE( COLOPLAST) SCROTAL APPROACH;  Surgeon: Kathie Rhodes, MD;  Location: [facility];  Service: Urology;  Laterality: N/A;  . POLYPECTOMY    . RADIOACTIVE PROSTATE  SEED IMPLANTS  May 2014  in [place], [place] Right 02/21/2016  . TOTAL KNEE ARTHROPLASTY Left 10- 2013  . TRANSTHORACIC ECHOCARDIOGRAM  12-26-2012   mild focal basal septal hypertrophy,  grade II diastolic dysfunction,  ef 55-60%,  mild LAE,  trivial MR and TR  . VASECTOMY  1980's w/ gen. anes.    Prior to Admission medications   Medication Sig Start Date End Date Taking? Authorizing Provider  amLODipine-olmesartan (AZOR) 10-40 MG per tablet Take 1 tablet by mouth daily. Patient taking differently: Take 1 tablet by mouth every morning.  10/25/14  Yes Minus Breeding, MD  aspirin EC 81 MG tablet Take 81 mg by mouth daily.   Yes Historical Provider, MD  Carboxymethylcellulose Sodium  (REFRESH LIQUIGEL OP) Apply 2 drops to eye as needed (for dry eyes).    Yes Historical Provider, MD  fluticasone (FLONASE) 50 MCG/ACT nasal spray Place 1 spray into both nostrils daily as needed for allergies or rhinitis.   Yes Historical Provider, MD  ibuprofen (ADVIL,MOTRIN) 800 MG tablet Take 800 mg by mouth every 8 (eight) hours as needed for moderate pain.    Yes Historical Provider, MD  Iron-Vitamins (GERITOL COMPLETE PO) Take 1 tablet by mouth every morning. With Iron   Yes Historical Provider, MD  metoprolol (LOPRESSOR) 50 MG tablet Take 50 mg by mouth daily.    Yes Historical Provider, MD  pioglitazone-metformin (ACTOPLUS MET) 15-850 MG tablet TAKE 1 TABLET BY MOUTH TWICE A DAY WITH MEAL 09/01/15  Yes Evie Lacks Plotnikov, MD  pravastatin (PRAVACHOL) 40 MG tablet TAKE ONE TABLET BY MOUTH EACH EVENING 11/22/15  Yes Minus Breeding, MD  tamsulosin (FLOMAX) 0.4 MG CAPS capsule Take 0.4 mg by mouth every morning.    Yes Historical Provider, MD  furosemide (LASIX) 20 MG tablet Take 1-2 tablets (20-40 mg total) by mouth daily as needed for edema. 07/13/14   Cassandria Anger, MD    Current Facility-Administered Medications  Medication Dose Route Frequency Provider Last Rate Last Dose  . pantoprazole (PROTONIX) injection 40 mg  40 mg Intravenous Q12H Elberta Leatherwood, MD   40 mg at 06/22/16 1338   Current Outpatient Prescriptions  Medication Sig Dispense Refill  . amLODipine-olmesartan (AZOR) 10-40 MG per tablet Take 1 tablet by mouth daily. (Patient taking differently: Take 1 tablet by mouth every morning. ) 90 tablet 3  . aspirin EC 81 MG tablet Take 81 mg by mouth daily.    . Carboxymethylcellulose Sodium (REFRESH LIQUIGEL OP) Apply 2 drops to eye as needed (for dry eyes).     . fluticasone (FLONASE) 50 MCG/ACT nasal spray Place 1 spray into both nostrils daily as needed for allergies or rhinitis.    Marland Kitchen ibuprofen (ADVIL,MOTRIN) 800 MG tablet Take 800 mg by mouth every 8 (eight) hours as needed  for moderate pain.     . Iron-Vitamins (GERITOL COMPLETE PO) Take 1 tablet by mouth every morning. With Iron    . metoprolol (LOPRESSOR) 50 MG tablet Take 50 mg by mouth daily.     . pioglitazone-metformin (ACTOPLUS MET) 15-850 MG tablet TAKE 1 TABLET BY MOUTH TWICE A DAY WITH MEAL 180 tablet 3  . pravastatin (PRAVACHOL) 40 MG tablet TAKE ONE TABLET BY MOUTH EACH EVENING 90 tablet 3  . tamsulosin (FLOMAX) 0.4 MG CAPS capsule Take 0.4 mg by mouth every morning.     . furosemide (LASIX) 20 MG tablet Take 1-2 tablets (20-40 mg total) by mouth daily as needed  for edema. 60 tablet 5    Allergies as of 06/22/2016 - Review Complete 06/22/2016  Allergen Reaction Noted  . Benazepril hcl Cough 03/02/2008  . Sildenafil Other (See Comments)     Family History  Problem Relation Age of Onset  . Diabetes Mother   . Heart disease Mother     Pacemaker  . Pancreatic cancer Father   . Stomach cancer Father   . Stomach cancer Sister   . Pancreatic cancer Sister   . Hypertension Other   . Colon cancer Other     according to info on colonoscopy  . Colon polyps Neg Hx   . Rectal cancer Neg Hx   . Esophageal cancer Neg Hx     Social History   Social History  . Marital status: Married    Spouse name: N/A  . Number of children: 2  . Years of education: N/A   Occupational History  . [employer]     Retired  . teaching    Social History Main Topics  . Smoking status: Former Smoker    Packs/day: 2.00    Years: 15.00    Types: Cigarettes    Quit date: 09/04/1999  . Smokeless tobacco: Never Used  . Alcohol use 0.0 oz/week     Comment: Occasional  . Drug use: No  . Sexual activity: Not on file   Other Topics Concern  . Not on file   Social History Narrative   Regular exercise- yes, walking    Review of Systems: Ten point ROS is O/w negative except as mentioned in HPI.  Physical Exam: Vital signs in last 24 hours: Pulse Rate:  [69-79] 78 (10/20 1330) Resp:  [15-21] 21 (10/20 1330) BP:  (120-144)/(65-89) 144/89 (10/20 1330) SpO2:  [97 %-100 %] 100 % (10/20 1330) Weight:  [260 lb (117.9 kg)] 260 lb (117.9 kg) (10/20 0956)   General:  Alert, Well-developed, well-nourished, pleasant and cooperative in NAD Head:  Normocephalic and atraumatic. Eyes:  Sclera clear, no icterus.  Conjunctiva pink. Ears:  Normal auditory acuity. Mouth:  No deformity or lesions.   Lungs:  Clear throughout to auscultation.  No wheezes, crackles, or rhonchi.  Heart:  Regular rate and rhythm; no murmurs, clicks, rubs,  or gallops. Abdomen:  Soft, non-distended.  BS present.  Mild diffuse TTP.  Hard region below xiphoid noted (calcification seen on CT scan).  Scars noted from previous surgery. Rectal:  Deferred.  Heme positive in the ED.  Msk:  Symmetrical without gross deformities. Pulses:  Normal pulses noted. Extremities:  Without clubbing or edema. Neurologic:  Alert and oriented x 4;  grossly normal neurologically. Skin:  Intact without significant lesions or rashes. Psych:  Alert and cooperative. Normal mood and affect.  Intake/Output this shift: Total I/O In: 1000 [IV Piggyback:1000] Out: 400 [Urine:400]  Lab Results:  Recent Labs  06/22/16 1054  WBC 7.2  HGB 10.3*  HCT 32.8*  PLT 297   BMET  Recent Labs  06/22/16 1054  NA 144  K 3.4*  CL 113*  CO2 24  GLUCOSE 122*  BUN 31*  CREATININE 0.92  CALCIUM 8.9   LFT  Recent Labs  06/22/16 1054  PROT 6.9  ALBUMIN 3.4*  AST 19  ALT 16*  ALKPHOS 55  BILITOT 0.2*   PT/INR  Recent Labs  06/22/16 1054  LABPROT 14.2  INR 1.09   IMPRESSION:  -Black, heme positive stools x 2 since last night:  Hgb down 3 grams compared to several months  ago.  Some recent NSAID use.  Not on PPI at home. -Rectal cancer s/p resection in 2016.  Colonoscopy 02/2016 ok but with some adenomatous polyp at the resection site.  Due for another flex sig 08/2016 with Dr. [name]. -S/p right hepatic lobectomy for benign lesion  PLAN: -Clear  liquids then NPO after midnight. -EGD 10/21. -Monitor Hgb and transfuse if needed. -Agree with pantoprazole 40 mg IV BID.   ZEHR, JESSICA D.  06/22/2016, 3:23 PM  Pager number [phone]

## 2016-06-23 NOTE — Op Note (Signed)
[facility] Patient Name: [name] [name] Procedure Date : 06/23/2016 MRN: [id number] Attending MD: Carlota Raspberry. Travus Oren MD, MD Date of Birth: May 08, 1951 CSN: [id number] Age: 65 Admit Type: Inpatient Procedure:                Upper GI endoscopy Indications:              Suspected upper gastrointestinal bleeding, recent                            NSAID use Providers:                Carlota Raspberry. Ezechiel Stooksbury MD, MD, Malka So, RN,                            William Dalton, Technician Referring MD:              Medicines:                Fentanyl 75 micrograms IV, Midazolam 5 mg IV,                            Diphenhydramine 25 mg IV Complications:            No immediate complications. Estimated blood loss:                            Minimal. Estimated Blood Loss:     Estimated blood loss was minimal. Procedure:                Pre-Anesthesia Assessment:                           - Prior to the procedure, a History and Physical                            was performed, and patient medications and                            allergies were reviewed. The patient's tolerance of                            previous anesthesia was also reviewed. The risks                            and benefits of the procedure and the sedation                            options and risks were discussed with the patient.                            All questions were answered, and informed consent                            was obtained. Prior Anticoagulants: The patient has  taken no previous anticoagulant or antiplatelet                            agents. ASA Grade Assessment: III - A patient with                            severe systemic disease. After reviewing the risks                            and benefits, the patient was deemed in                            satisfactory condition to undergo the procedure.                           After obtaining informed  consent, the endoscope was                            passed under direct vision. Throughout the                            procedure, the patient's blood pressure, pulse, and                            oxygen saturations were monitored continuously. The                            [id number] scope was introduced through the                            mouth, and advanced to the second part of duodenum.                            The upper GI endoscopy was accomplished without                            difficulty. The patient tolerated the procedure                            well. Scope In: Scope Out: Findings:      Esophagogastric landmarks were identified: the Z-line was found at 42       cm, the gastroesophageal junction was found at 42 cm and the upper       extent of the gastric folds was found at 42 cm from the incisors.      The exam of the esophagus was otherwise normal.      Two non-bleeding clean based cratered gastric ulcers with no stigmata of       bleeding were found at the pylorus. The largest lesion was 7 mm in       largest dimension, the smallest was 14mm in diameter. There was       associated edema in the pyloric channel.      Localized mild inflammation characterized by congestion (edema) and       erythema was found in the gastric  antrum. Biopsies were taken from the       antrum and body with a cold forceps for Helicobacter pylori testing.      The exam of the stomach was otherwise normal.      Patchy mildly erythematous mucosa was found in the duodenal bulb.      The exam of the duodenum was otherwise normal. Impression:               - Esophagogastric landmarks identified.                           - Non-bleeding gastric ulcers with no stigmata of                            bleeding.                           - Pyloric edema with gastritis. Biopsied.                           - Erythematous duodenopathy.                           Overall, suspect  findings are related to NSAIDs,                            although testing for H pylori. Moderate Sedation:      Moderate (conscious) sedation was administered by the endoscopy nurse       and supervised by the endoscopist. The following parameters were       monitored: oxygen saturation, heart rate, blood pressure, and response       to care. Total physician intraservice time was 21 minutes. Recommendation:           - Return patient to hospital ward for ongoing care.                           - Full liquid diet, then advance as tolerated                           - Continue present medications - protonix 40mg  PO                            BID x 4 weeks, then once daily                           - Await pathology results, treat for H pylori if                            positive                           - Given clean based ulcers, if the patient is                            tolerating PO I think he can be discharged later  today given the very low risk of rebleeding with                            this type of lesion                           - NO NSAIDs, use only tylenol PRN For pain                           - Repeat upper endoscopy in 3 months to evaluate                            the response to therapy. Our office will call to                            schedule this appointment. Procedure Code(s):        --- Professional ---                           [phone], Esophagogastroduodenoscopy, flexible,                            transoral; with biopsy, single or multiple                           99152, Moderate sedation services provided by the                            same physician or other qualified health care                            professional performing the diagnostic or                            therapeutic service that the sedation supports,                            requiring the presence of an independent trained                             observer to assist in the monitoring of the                            patient's level of consciousness and physiological                            status; initial 15 minutes of intraservice time,                            patient age 40 years or older Diagnosis Code(s):        --- Professional ---                           K25.9, Gastric ulcer, unspecified as acute or  chronic, without hemorrhage or perforation                           K29.70, Gastritis, unspecified, without bleeding CPT copyright 2016 [employer] Association. All rights reserved. The codes documented in this report are preliminary and upon coder review may  be revised to meet current compliance requirements. Remo Lipps P. Luan Urbani MD, MD 06/23/2016 11:19:32 AM This report has been signed electronically. Number of Addenda: 0

## 2016-06-25 ENCOUNTER — Encounter (HOSPITAL_COMMUNITY): Payer: Self-pay | Admitting: Gastroenterology

## 2016-06-25 ENCOUNTER — Telehealth: Payer: Self-pay | Admitting: *Deleted

## 2016-06-25 NOTE — Telephone Encounter (Signed)
Transition Care Management Follow-up Telephone Call   Date discharged?06/23/16   How have you been since you were released from the hospital? Pt states he is doing alright   Do you understand why you were in the hospital? YES   Do you understand the discharge instructions? YES   Where were you discharged to? Home   Items Reviewed:  Medications reviewed: YES  Allergies reviewed: YES  Dietary changes reviewed: YES  Referrals reviewed: No referral needed   Functional Questionnaire:  Activities of Daily Living (ADLs):   He states he are independent in the following: ambulation, bathing and hygiene, feeding, continence, grooming, toileting and dressing States he doesn't require assistance    Any transportation issues/concerns?: NO   Any patient concerns? NO   Confirmed importance and date/time of follow-up visits scheduled YES,appt made 07/13/16   Provider Appointment booked with Dr. Alain Marion  Confirmed with patient if condition begins to worsen call PCP or go to the ER.  Patient was given the office number and encouraged to call back with question or concerns.  : YES

## 2016-06-27 ENCOUNTER — Encounter: Payer: Self-pay | Admitting: Gastroenterology

## 2016-06-27 NOTE — Progress Notes (Signed)
Letter mailed 10/25. jf

## 2016-07-02 ENCOUNTER — Telehealth: Payer: Self-pay | Admitting: Internal Medicine

## 2016-07-02 NOTE — Telephone Encounter (Signed)
error 

## 2016-07-11 ENCOUNTER — Other Ambulatory Visit: Payer: Self-pay | Admitting: Internal Medicine

## 2016-07-13 ENCOUNTER — Ambulatory Visit (INDEPENDENT_AMBULATORY_CARE_PROVIDER_SITE_OTHER): Payer: Medicare Other | Admitting: Internal Medicine

## 2016-07-13 ENCOUNTER — Other Ambulatory Visit (INDEPENDENT_AMBULATORY_CARE_PROVIDER_SITE_OTHER): Payer: Medicare Other

## 2016-07-13 ENCOUNTER — Encounter: Payer: Self-pay | Admitting: Internal Medicine

## 2016-07-13 DIAGNOSIS — I480 Paroxysmal atrial fibrillation: Secondary | ICD-10-CM | POA: Diagnosis not present

## 2016-07-13 DIAGNOSIS — E118 Type 2 diabetes mellitus with unspecified complications: Secondary | ICD-10-CM

## 2016-07-13 DIAGNOSIS — K254 Chronic or unspecified gastric ulcer with hemorrhage: Secondary | ICD-10-CM

## 2016-07-13 DIAGNOSIS — D500 Iron deficiency anemia secondary to blood loss (chronic): Secondary | ICD-10-CM

## 2016-07-13 DIAGNOSIS — I10 Essential (primary) hypertension: Secondary | ICD-10-CM

## 2016-07-13 LAB — BASIC METABOLIC PANEL
BUN: 14 mg/dL (ref 6–23)
CHLORIDE: 104 meq/L (ref 96–112)
CO2: 28 meq/L (ref 19–32)
Calcium: 9.1 mg/dL (ref 8.4–10.5)
Creatinine, Ser: 0.91 mg/dL (ref 0.40–1.50)
GFR: 106.93 mL/min (ref >60.00)
GLUCOSE: 98 mg/dL (ref 70–99)
Potassium: 4 mEq/L (ref 3.5–5.1)
SODIUM: 139 meq/L (ref 135–145)

## 2016-07-13 LAB — CBC WITH DIFFERENTIAL/PLATELET
BASOS ABS: 0 10*3/uL (ref 0.0–0.1)
BASOS PCT: 0.5 % (ref 0.0–3.0)
EOS PCT: 2.4 % (ref 0.0–5.0)
Eosinophils Absolute: 0.1 10*3/uL (ref 0.0–0.7)
HEMATOCRIT: 31 % — AB (ref 39.0–52.0)
Hemoglobin: 10.1 g/dL — ABNORMAL LOW (ref 13.0–17.0)
LYMPHS PCT: 21.4 % (ref 12.0–46.0)
Lymphs Abs: 1.2 10*3/uL (ref 0.7–4.0)
MCHC: 32.5 g/dL (ref 30.0–36.0)
MCV: 85.3 fl (ref 78.0–100.0)
MONOS PCT: 10.5 % (ref 3.0–12.0)
Monocytes Absolute: 0.6 10*3/uL (ref 0.1–1.0)
NEUTROS ABS: 3.7 10*3/uL (ref 1.4–7.7)
Neutrophils Relative %: 65.2 % (ref 43.0–77.0)
PLATELETS: 414 10*3/uL — AB (ref 150.0–400.0)
RBC: 3.64 Mil/uL — ABNORMAL LOW (ref 4.22–5.81)
RDW: 14.9 % (ref 11.5–15.5)
WBC: 5.6 10*3/uL (ref 4.0–10.5)

## 2016-07-13 LAB — HEMOGLOBIN A1C: Hgb A1c MFr Bld: 6.2 % (ref 4.6–6.5)

## 2016-07-13 MED ORDER — PIOGLITAZONE HCL-METFORMIN HCL 15-850 MG PO TABS: ORAL_TABLET | ORAL | 3 refills | Status: DC

## 2016-07-13 MED ORDER — METOPROLOL TARTRATE 50 MG PO TABS: 50.0000 mg | ORAL_TABLET | Freq: Every day | ORAL | 3 refills | Status: DC

## 2016-07-13 MED ORDER — ACETAMINOPHEN-CODEINE #3 300-30 MG PO TABS: 1.0000 | ORAL_TABLET | Freq: Four times a day (QID) | ORAL | 1 refills | Status: DC | PRN

## 2016-07-13 MED ORDER — FUROSEMIDE 20 MG PO TABS: 20.0000 mg | ORAL_TABLET | Freq: Every day | ORAL | 3 refills | Status: DC | PRN

## 2016-07-13 MED ORDER — PANTOPRAZOLE SODIUM 40 MG PO TBEC: 40.0000 mg | DELAYED_RELEASE_TABLET | Freq: Two times a day (BID) | ORAL | 1 refills | Status: DC

## 2016-07-13 MED ORDER — AMLODIPINE-OLMESARTAN 10-40 MG PO TABS: 1.0000 | ORAL_TABLET | Freq: Every morning | ORAL | 3 refills | Status: DC

## 2016-07-13 MED ORDER — PRAVASTATIN SODIUM 40 MG PO TABS
ORAL_TABLET | ORAL | 3 refills | Status: DC
Start: 2016-07-13 — End: 2019-03-10

## 2016-07-13 MED ORDER — TAMSULOSIN HCL 0.4 MG PO CAPS: 0.4000 mg | ORAL_CAPSULE | Freq: Every morning | ORAL | 3 refills | Status: DC

## 2016-07-13 MED ORDER — FLUTICASONE PROPIONATE 50 MCG/ACT NA SUSP: NASAL | 3 refills | Status: DC

## 2016-07-13 NOTE — Progress Notes (Signed)
Subjective:  Patient ID: [name] [name], male    DOB: February 18, 1951  Age: 65 y.o. MRN: [id number]  CC: No chief complaint on file.   HPI [name] [name] presents for post-hosp f/u for NSAID induced gastric ulcer w/bleeding. On Iron  Outpatient Medications Prior to Visit  Medication Sig Dispense Refill  . acetaminophen-codeine (TYLENOL #3) 300-30 MG tablet Take 1 tablet by mouth every 6 (six) hours as needed for moderate pain. 30 tablet 0  . amLODipine-olmesartan (AZOR) 10-40 MG per tablet Take 1 tablet by mouth daily. (Patient taking differently: Take 1 tablet by mouth every morning. ) 90 tablet 3  . aspirin EC 81 MG tablet Take 81 mg by mouth daily.    . Carboxymethylcellulose Sodium (REFRESH LIQUIGEL OP) Apply 2 drops to eye as needed (for dry eyes).     . fluticasone (FLONASE) 50 MCG/ACT nasal spray Place 1 spray into both nostrils daily as needed for allergies or rhinitis.    . fluticasone (FLONASE) 50 MCG/ACT nasal spray USE ONE SPRAY IN EACH NOSTRIL DAILY **NEED TO MAKE APPT WITH YOUR DR** 16 g 1  . furosemide (LASIX) 20 MG tablet Take 1-2 tablets (20-40 mg total) by mouth daily as needed for edema. 60 tablet 5  . Iron-Vitamins (GERITOL COMPLETE PO) Take 1 tablet by mouth every morning. With Iron    . metoprolol (LOPRESSOR) 50 MG tablet Take 50 mg by mouth daily.     . pantoprazole (PROTONIX) 40 MG tablet Take 1 tablet (40 mg total) by mouth 2 (two) times daily before a meal. Twice daily for 4 weeks then once daily 90 tablet 0  . pioglitazone-metformin (ACTOPLUS MET) 15-850 MG tablet TAKE 1 TABLET BY MOUTH TWICE A DAY WITH MEAL 180 tablet 3  . pravastatin (PRAVACHOL) 40 MG tablet TAKE ONE TABLET BY MOUTH EACH EVENING 90 tablet 3  . tamsulosin (FLOMAX) 0.4 MG CAPS capsule Take 0.4 mg by mouth every morning.      No facility-administered medications prior to visit.     ROS Review of Systems  Constitutional: Positive for fatigue. Negative for appetite change and unexpected weight  change.  HENT: Negative for congestion, nosebleeds, sneezing, sore throat and trouble swallowing.   Eyes: Negative for itching and visual disturbance.  Respiratory: Negative for cough and shortness of breath.   Cardiovascular: Negative for chest pain, palpitations and leg swelling.  Gastrointestinal: Negative for abdominal distention, blood in stool, diarrhea and nausea.  Genitourinary: Negative for frequency and hematuria.  Musculoskeletal: Positive for arthralgias and gait problem. Negative for back pain, joint swelling and neck pain.  Skin: Negative for rash.  Neurological: Negative for dizziness, tremors, speech difficulty and weakness.  Psychiatric/Behavioral: Negative for agitation, dysphoric mood, sleep disturbance and suicidal ideas. The patient is not nervous/anxious.     Objective:  BP 120/80 (BP Location: Left Arm, Patient Position: Sitting, Cuff Size: Large)   Pulse 78   Temp 98.3 F (36.8 C) (Oral)   Ht '6\' 2"'  (1.88 m)   Wt 253 lb (114.8 kg)   SpO2 95%   BMI 32.48 kg/m   BP Readings from Last 3 Encounters:  07/13/16 120/80  06/23/16 129/61  02/15/16 125/78    Wt Readings from Last 3 Encounters:  07/13/16 253 lb (114.8 kg)  06/22/16 260 lb (117.9 kg)  02/15/16 279 lb (126.6 kg)    Physical Exam  Constitutional: He is oriented to person, place, and time. He appears well-developed. No distress.  NAD  HENT:  Mouth/Throat: Oropharynx  is clear and moist.  Eyes: Conjunctivae are normal. Pupils are equal, round, and reactive to light.  Neck: Normal range of motion. No JVD present. No thyromegaly present.  Cardiovascular: Normal rate, regular rhythm, normal heart sounds and intact distal pulses.  Exam reveals no gallop and no friction rub.   No murmur heard. Pulmonary/Chest: Effort normal and breath sounds normal. No respiratory distress. He has no wheezes. He has no rales. He exhibits no tenderness.  Abdominal: Soft. Bowel sounds are normal. He exhibits no distension  and no mass. There is no tenderness. There is no rebound and no guarding.  Musculoskeletal: Normal range of motion. He exhibits edema. He exhibits no tenderness.  Lymphadenopathy:    He has no cervical adenopathy.  Neurological: He is alert and oriented to person, place, and time. He has normal reflexes. No cranial nerve deficit. He exhibits normal muscle tone. He displays a negative Romberg sign. Coordination abnormal. Gait normal.  Skin: Skin is warm and dry. No rash noted.  Psychiatric: He has a normal mood and affect. His behavior is normal. Judgment and thought content normal.  trace edema B knees w/scar  Lab Results  Component Value Date   WBC 6.1 06/23/2016   HGB 9.2 (L) 06/23/2016   HCT 28.7 (L) 06/23/2016   PLT 241 06/23/2016   GLUCOSE 107 (H) 06/23/2016   CHOL 185 07/13/2014   TRIG 58.0 07/13/2014   HDL 45.50 07/13/2014   LDLDIRECT 125.4 07/14/2008   LDLCALC 128 (H) 07/13/2014   ALT 16 (L) 06/22/2016   AST 19 06/22/2016   NA 139 06/23/2016   K 3.3 (L) 06/23/2016   CL 112 (H) 06/23/2016   CREATININE 0.79 06/23/2016   BUN 18 06/23/2016   CO2 23 06/23/2016   TSH 1.91 08/10/2015   PSA 0.50 03/17/2014   INR 1.14 06/23/2016   HGBA1C 5.9 12/21/2014    Dg Abd 2 Views  Result Date: 06/22/2016 CLINICAL DATA:  Nausea, lightheadedness EXAM: ABDOMEN - 2 VIEW COMPARISON:  CT abdomen 01/25/2015 FINDINGS: The bowel gas pattern is normal. There is no evidence of free air. No radio-opaque calculi or other significant radiographic abnormality is seen. Prostatic radiation seeds are noted. IMPRESSION: Negative. Electronically Signed   By: Kathreen Devoid   On: 06/22/2016 15:44    Assessment & Plan:   There are no diagnoses linked to this encounter. I am having Mr. Crutchfield maintain his tamsulosin, furosemide, amLODipine-olmesartan, Iron-Vitamins (GERITOL COMPLETE PO), aspirin EC, metoprolol, pioglitazone-metformin, pravastatin, Carboxymethylcellulose Sodium (REFRESH LIQUIGEL OP),  fluticasone, pantoprazole, acetaminophen-codeine, and fluticasone.  No orders of the defined types were placed in this encounter.    Follow-up: No Follow-up on file.  Walker Kehr, MD

## 2016-07-13 NOTE — Assessment & Plan Note (Signed)
On ASA 

## 2016-07-13 NOTE — Assessment & Plan Note (Signed)
Labs

## 2016-07-13 NOTE — Assessment & Plan Note (Signed)
Lopressor [employer]

## 2016-07-13 NOTE — Assessment & Plan Note (Signed)
CBC

## 2016-07-13 NOTE — Progress Notes (Signed)
Pre visit review using our clinic review tool, if applicable. No additional management support is needed unless otherwise documented below in the visit note. 

## 2016-07-13 NOTE — Assessment & Plan Note (Signed)
NSAID induced gastric ulcer w/bleeding Labs Protonix Iron po

## 2016-08-02 ENCOUNTER — Telehealth: Payer: Self-pay | Admitting: *Deleted

## 2016-08-02 ENCOUNTER — Ambulatory Visit (AMBULATORY_SURGERY_CENTER): Payer: Self-pay | Admitting: *Deleted

## 2016-08-02 VITALS — Ht 74.0 in | Wt 265.0 lb

## 2016-08-02 DIAGNOSIS — Z86010 Personal history of colonic polyps: Secondary | ICD-10-CM

## 2016-08-02 DIAGNOSIS — Z85038 Personal history of other malignant neoplasm of large intestine: Secondary | ICD-10-CM

## 2016-08-02 NOTE — Telephone Encounter (Signed)
You can schedule both together. Make a note. Thanks

## 2016-08-02 NOTE — Telephone Encounter (Signed)
Called [name] [name]- will be feb 2018 before Dr [name] can do both procedures together- [name] [name] wishes to do flex 12-14 and schedule egd at a later date  Scheduled PV 1-31 and egd 2-6 with Dr [name] with [name] [name]. Mailed AVS with dates and times of above mentioned appointments   [name] PV

## 2016-08-02 NOTE — Telephone Encounter (Signed)
Dr [name],  [name] [name] is scheduled to have a flex sig with you 12-14 Thursday for a hx of colon cancer, hx colon polyps 6 month follow up from 02-15-2016. He was admitted 06-2016 for blood in stool and Dr Havery Moros did an EGD 06-23-2016 and pt was dx'd with gastric ulcers.   Dr Havery Moros said repeat egd 3 months.  He and his wife are asking if he can do both in December or should he just schedule egd for 09-2016 and proceed with flex 12-14  Please advise, thanks for your time,  Marijean Niemann

## 2016-08-02 NOTE — Progress Notes (Signed)
No egg or soy allergy known to patient  No issues with past sedation with any surgeries  or procedures, no intubation problems  No diet pills per patient No home 02 use per patient  No blood thinners per patient  Pt denies issues with constipation  No A fib since 1 episode with liver surgery,   No  A flutter

## 2016-08-16 ENCOUNTER — Encounter: Payer: Self-pay | Admitting: Internal Medicine

## 2016-08-16 ENCOUNTER — Ambulatory Visit (AMBULATORY_SURGERY_CENTER): Payer: Medicare Other | Admitting: Internal Medicine

## 2016-08-16 VITALS — BP 124/58 | HR 62 | Temp 97.1°F | Resp 19 | Ht 74.0 in | Wt 265.0 lb

## 2016-08-16 DIAGNOSIS — Z86010 Personal history of colonic polyps: Secondary | ICD-10-CM

## 2016-08-16 DIAGNOSIS — Z1212 Encounter for screening for malignant neoplasm of rectum: Secondary | ICD-10-CM

## 2016-08-16 DIAGNOSIS — Z85038 Personal history of other malignant neoplasm of large intestine: Secondary | ICD-10-CM | POA: Diagnosis not present

## 2016-08-16 LAB — GLUCOSE, CAPILLARY
GLUCOSE-CAPILLARY: 109 mg/dL — AB (ref 65–99)
Glucose-Capillary: 124 mg/dL — ABNORMAL HIGH (ref 65–99)

## 2016-08-16 MED ORDER — SODIUM CHLORIDE 0.9 % IV SOLN: 500.0000 mL | INTRAVENOUS | Status: DC

## 2016-08-16 NOTE — Op Note (Signed)
[facility] Patient Name: [name] [name] Procedure Date: 08/16/2016 8:15 AM MRN: [id number] Endoscopist: Docia Chuck. [name] [name] , MD Age: 65 Referring MD:  Date of Birth: 1950-12-15 Gender: Male Account #: [id number] Procedure:                Flexible Sigmoidoscopy Indications:              Personal history of malignant neoplasm of the                            colon. Patient with a history of multiple and                            advanced adenomatous. As well, malignant rectal                            polyp at the anal verge May 2016. Follow-up                            flexible sigmoidoscopy June 2017 with minimal                            residual adenomatous tissue without cancer. Now for                            relook flexible sigmoidoscopy Medicines:                Monitored Anesthesia Care Procedure:                Pre-Anesthesia Assessment:                           - Prior to the procedure, a History and Physical                            was performed, and patient medications and                            allergies were reviewed. The patient's tolerance of                            previous anesthesia was also reviewed. The risks                            and benefits of the procedure and the sedation                            options and risks were discussed with the patient.                            All questions were answered, and informed consent                            was obtained. Prior Anticoagulants: The patient has  taken no previous anticoagulant or antiplatelet                            agents. ASA Grade Assessment: II - A patient with                            mild systemic disease. After reviewing the risks                            and benefits, the patient was deemed in                            satisfactory condition to undergo the procedure.                           After obtaining informed consent, the  scope was                            passed under direct vision. The Model [id number]-[id number]                            [id number]) scope was introduced through the anus                            The procedure was aborted. The scope was not                            inserted. rectosigmoid junction Medications were                            rectosigmoid junction. The flexible sigmoidoscopy                            was accomplished without difficulty. The patient                            tolerated the procedure well. The quality of the                            bowel preparation was adequate. Scope In: Scope Out: Findings:                 The perianal and digital rectal examinations were                            normal.                           The rectum appeared normal. Very careful inspection                            from the anal os circumferentially as well as 360?                            retroflex views revealed no evidence  of residual                            neoplasia. The area looked normal. Nothing to                            biopsy. Complications:            No immediate complications. Estimated Blood Loss:     Estimated blood loss: none. Impression:               - The rectum is normal.                           - No specimens collected. Recommendation:           - Repeat complete colonoscopyin 1 year for                            surveillance.                           - Keep plans for upper endoscopy as scheduled and                            stay on ulcer medication [name] N. [name] Pastor, MD 08/16/2016 1:57:59 PM This report has been signed electronically.

## 2016-08-16 NOTE — Patient Instructions (Signed)
Impression/Recommendations:  Repeat colonoscopy in 1 year for surveillance.  YOU HAD AN ENDOSCOPIC PROCEDURE TODAY AT [facility]:   Refer to the procedure report that was given to you for any specific questions about what was found during the examination.  If the procedure report does not answer your questions, please call your gastroenterologist to clarify.  If you requested that your care partner not be given the details of your procedure findings, then the procedure report has been included in a sealed envelope for you to review at your convenience later.  YOU SHOULD EXPECT: Some feelings of bloating in the abdomen. Passage of more gas than usual.  Walking can help get rid of the air that was put into your GI tract during the procedure and reduce the bloating. If you had a lower endoscopy (such as a colonoscopy or flexible sigmoidoscopy) you may notice spotting of blood in your stool or on the toilet paper. If you underwent a bowel prep for your procedure, you may not have a normal bowel movement for a few days.  Please Note:  You might notice some irritation and congestion in your nose or some drainage.  This is from the oxygen used during your procedure.  There is no need for concern and it should clear up in a day or so.  SYMPTOMS TO REPORT IMMEDIATELY:   Following lower endoscopy (colonoscopy or flexible sigmoidoscopy):  Excessive amounts of blood in the stool  Significant tenderness or worsening of abdominal pains  Swelling of the abdomen that is new, acute  Fever of 100F or higher For urgent or emergent issues, a gastroenterologist can be reached at any hour by calling [phone].   DIET:  We do recommend a small meal at first, but then you may proceed to your regular diet.  Drink plenty of fluids but you should avoid alcoholic beverages for 24 hours.  ACTIVITY:  You should plan to take it easy for the rest of today and you should NOT DRIVE or use heavy  machinery until tomorrow (because of the sedation medicines used during the test).    FOLLOW UP: Our staff will call the number listed on your records the next business day following your procedure to check on you and address any questions or concerns that you may have regarding the information given to you following your procedure. If we do not reach you, we will leave a message.  However, if you are feeling well and you are not experiencing any problems, there is no need to return our call.  We will assume that you have returned to your regular daily activities without incident.  If any biopsies were taken you will be contacted by phone or by letter within the next 1-3 weeks.  Please call us at [phone] if you have not heard about the biopsies in 3 weeks.    SIGNATURES/CONFIDENTIALITY: You and/or your care partner have signed paperwork which will be entered into your electronic medical record.  These signatures attest to the fact that that the information above on your After Visit Summary has been reviewed and is understood.  Full responsibility of the confidentiality of this discharge information lies with you and/or your care-partner.

## 2016-08-16 NOTE — Progress Notes (Signed)
Report to PACU, RN, vss, BBS= Clear.  

## 2016-08-17 ENCOUNTER — Telehealth: Payer: Self-pay

## 2016-08-17 NOTE — Telephone Encounter (Signed)
  Follow up Call-  Call back number 08/16/2016 02/15/2016 01/13/2015  Post procedure Call Back phone  # [phone] 336 [phone] wife's cell [phone]  Permission to leave phone message Yes Yes Yes  Some recent data might be hidden     Patient questions:  Do you have a fever, pain , or abdominal swelling? No. Pain Score  0 *  Have you tolerated food without any problems? Yes.    Have you been able to return to your normal activities? Yes.    Do you have any questions about your discharge instructions: Diet   No. Medications  No. Follow up visit  No.  Do you have questions or concerns about your Care? No.  Actions: * If pain score is 4 or above: No action needed, pain <4.

## 2016-10-03 ENCOUNTER — Ambulatory Visit (AMBULATORY_SURGERY_CENTER): Payer: Self-pay | Admitting: *Deleted

## 2016-10-03 VITALS — Ht 74.0 in | Wt 273.0 lb

## 2016-10-03 DIAGNOSIS — K253 Acute gastric ulcer without hemorrhage or perforation: Secondary | ICD-10-CM

## 2016-10-03 NOTE — Progress Notes (Signed)
Patient denies any allergies to eggs or soy. Patient denies any problems with anesthesia/sedation. Patient denies any oxygen use at home and does not take any diet/weight loss medications. Patient denies being on a "research" as per chart.

## 2016-10-09 ENCOUNTER — Encounter: Payer: Medicare Other | Admitting: Internal Medicine

## 2016-10-15 ENCOUNTER — Ambulatory Visit: Payer: Medicare Other | Admitting: Internal Medicine

## 2016-10-22 ENCOUNTER — Ambulatory Visit (AMBULATORY_SURGERY_CENTER): Payer: Medicare Other | Admitting: Internal Medicine

## 2016-10-22 ENCOUNTER — Encounter: Payer: Self-pay | Admitting: Internal Medicine

## 2016-10-22 VITALS — BP 99/60 | HR 63 | Temp 98.4°F | Resp 12 | Ht 74.0 in | Wt 273.0 lb

## 2016-10-22 DIAGNOSIS — K253 Acute gastric ulcer without hemorrhage or perforation: Secondary | ICD-10-CM

## 2016-10-22 DIAGNOSIS — K259 Gastric ulcer, unspecified as acute or chronic, without hemorrhage or perforation: Secondary | ICD-10-CM | POA: Diagnosis not present

## 2016-10-22 MED ORDER — SODIUM CHLORIDE 0.9 % IV SOLN: 500.0000 mL | INTRAVENOUS | Status: DC

## 2016-10-22 NOTE — Progress Notes (Signed)
Report given to PACU, vss 

## 2016-10-22 NOTE — Op Note (Signed)
[place] Patient Name: [name] [name] Procedure Date: 10/22/2016 10:24 AM MRN: [id number] Endoscopist: Docia Chuck. [name] , MD Age: 66 Referring MD:  Date of Birth: 1951/08/28 Gender: Male Account #: [id number] Procedure:                Upper GI endoscopy, with biopsy Indications:              Follow-up of acute gastric ulcer. On baby aspirin                            but off other NSAIDs. On daily PPI. Prepyloric                            Ulcers diagnosed October 2017, now for follow-up.                            H. pylori negative Medicines:                Monitored Anesthesia Care Procedure:                Pre-Anesthesia Assessment:                           - Prior to the procedure, a History and Physical                            was performed, and patient medications and                            allergies were reviewed. The patient's tolerance of                            previous anesthesia was also reviewed. The risks                            and benefits of the procedure and the sedation                            options and risks were discussed with the patient.                            All questions were answered, and informed consent                            was obtained. Prior Anticoagulants: The patient has                            taken no previous anticoagulant or antiplatelet                            agents. ASA Grade Assessment: II - A patient with                            mild systemic disease. After reviewing the risks  and benefits, the patient was deemed in                            satisfactory condition to undergo the procedure.                           After obtaining informed consent, the endoscope was                            passed under direct vision. Throughout the                            procedure, the patient's blood pressure, pulse, and                            oxygen saturations were  monitored continuously. The                            Model GIF-[id number] scope was introduced                            through the mouth, and advanced to the second part                            of duodenum. The upper GI endoscopy was                            accomplished without difficulty. The patient                            tolerated the procedure well. Scope In: Scope Out: Findings:                 The esophagus was normal.                           A single 4 mm erosion was found in the prepyloric                            region of the stomach. This is in the region of the                            previous ulcer. Previous ulcers healed. Biopsies                            were taken with a cold forceps for histology.                           The stomach was otherwise normal.                           The examined duodenum was normal.                           The cardia and gastric fundus  were normal on                            retroflexion. Complications:            No immediate complications. Estimated Blood Loss:     Estimated blood loss: none. Impression:               - Normal esophagus.                           - Erosive gastropathy. Biopsied.                           - Normal stomach.                           - Normal examined duodenum. Recommendation:           - Patient has a contact number available for                            emergencies. The signs and symptoms of potential                            delayed complications were discussed with the                            patient. Return to normal activities tomorrow.                            Written discharge instructions were provided to the                            patient.                           - Resume previous diet.                           - Continue present medications. Stay Protonix                            (pantoprazole) 40 mg daily indefinitely. Docia Chuck. [name],  MD 10/22/2016 10:45:31 AM This report has been signed electronically.

## 2016-10-22 NOTE — Progress Notes (Signed)
Called to room to assist during endoscopic procedure.  Patient ID and intended procedure confirmed with present staff. Received instructions for my participation in the procedure from the performing physician.  

## 2016-10-22 NOTE — Patient Instructions (Signed)
Impression/Recommendations:  Continue present medications.  Stay on Protonix (pantoprozole) 40 mg. Daily.  YOU HAD AN ENDOSCOPIC PROCEDURE TODAY AT [facility]:   Refer to the procedure report that was given to you for any specific questions about what was found during the examination.  If the procedure report does not answer your questions, please call your gastroenterologist to clarify.  If you requested that your care partner not be given the details of your procedure findings, then the procedure report has been included in a sealed envelope for you to review at your convenience later.  YOU SHOULD EXPECT: Some feelings of bloating in the abdomen. Passage of more gas than usual.  Walking can help get rid of the air that was put into your GI tract during the procedure and reduce the bloating. If you had a lower endoscopy (such as a colonoscopy or flexible sigmoidoscopy) you may notice spotting of blood in your stool or on the toilet paper. If you underwent a bowel prep for your procedure, you may not have a normal bowel movement for a few days.  Please Note:  You might notice some irritation and congestion in your nose or some drainage.  This is from the oxygen used during your procedure.  There is no need for concern and it should clear up in a day or so.  SYMPTOMS TO REPORT IMMEDIATELY:  Following upper endoscopy (EGD)  Vomiting of blood or coffee ground material  New chest pain or pain under the shoulder blades  Painful or persistently difficult swallowing  New shortness of breath  Fever of 100F or higher  Black, tarry-looking stools  For urgent or emergent issues, a gastroenterologist can be reached at any hour by calling [phone].   DIET:  We do recommend a small meal at first, but then you may proceed to your regular diet.  Drink plenty of fluids but you should avoid alcoholic beverages for 24 hours.  ACTIVITY:  You should plan to take it easy for the rest of  today and you should NOT DRIVE or use heavy machinery until tomorrow (because of the sedation medicines used during the test).    FOLLOW UP: Our staff will call the number listed on your records the next business day following your procedure to check on you and address any questions or concerns that you may have regarding the information given to you following your procedure. If we do not reach you, we will leave a message.  However, if you are feeling well and you are not experiencing any problems, there is no need to return our call.  We will assume that you have returned to your regular daily activities without incident.  If any biopsies were taken you will be contacted by phone or by letter within the next 1-3 weeks.  Please call us at [phone] if you have not heard about the biopsies in 3 weeks.    SIGNATURES/CONFIDENTIALITY: You and/or your care partner have signed paperwork which will be entered into your electronic medical record.  These signatures attest to the fact that that the information above on your After Visit Summary has been reviewed and is understood.  Full responsibility of the confidentiality of this discharge information lies with you and/or your care-partner.

## 2016-10-23 ENCOUNTER — Telehealth: Payer: Self-pay | Admitting: *Deleted

## 2016-10-23 NOTE — Telephone Encounter (Signed)
  Follow up Call-  Call back number 10/22/2016 08/16/2016 02/15/2016 01/13/2015  Post procedure Call Back phone  # [phone] wife's cell [phone]  Permission to leave phone message Yes Yes Yes Yes  Some recent data might be hidden     Patient questions:  Do you have a fever, pain , or abdominal swelling? No. Pain Score  0 *  Have you tolerated food without any problems? Yes.    Have you been able to return to your normal activities? Yes.    Do you have any questions about your discharge instructions: Diet   No. Medications  No. Follow up visit  No.  Do you have questions or concerns about your Care? No.  Actions: * If pain score is 4 or above: No action needed, pain <4.

## 2016-10-26 ENCOUNTER — Encounter: Payer: Self-pay | Admitting: Internal Medicine

## 2016-10-26 ENCOUNTER — Telehealth: Payer: Self-pay

## 2016-10-26 ENCOUNTER — Other Ambulatory Visit: Payer: Self-pay

## 2016-10-26 MED ORDER — CLARITHROMYCIN 500 MG PO TABS: 500.0000 mg | ORAL_TABLET | Freq: Two times a day (BID) | ORAL | 0 refills | Status: DC

## 2016-10-26 MED ORDER — AMOXICILLIN 500 MG PO TABS: 1000.0000 mg | ORAL_TABLET | Freq: Two times a day (BID) | ORAL | 0 refills | Status: DC

## 2016-10-26 MED ORDER — PANTOPRAZOLE SODIUM 40 MG PO TBEC: 40.0000 mg | DELAYED_RELEASE_TABLET | Freq: Two times a day (BID) | ORAL | 0 refills | Status: DC

## 2016-10-26 NOTE — Telephone Encounter (Signed)
Spoke with pt and he is aware. Scripts sent to pharmacy. 

## 2016-10-26 NOTE — Telephone Encounter (Signed)
-----   Message from Irene Shipper, MD sent at 10/26/2016  9:21 AM EST ----- Regarding: H. pylori + Call patient regarding H. Pylori +. Treat with amoxicillin 1 gm bid, biaxin 500 mg bid, and increase his pantoprazole to bid. All for 2 weeks. Then resume qd PPI.  (I also sent him a letter)

## 2016-10-29 ENCOUNTER — Ambulatory Visit (INDEPENDENT_AMBULATORY_CARE_PROVIDER_SITE_OTHER): Payer: Medicare Other | Admitting: Internal Medicine

## 2016-10-29 ENCOUNTER — Encounter: Payer: Self-pay | Admitting: Internal Medicine

## 2016-10-29 ENCOUNTER — Other Ambulatory Visit (INDEPENDENT_AMBULATORY_CARE_PROVIDER_SITE_OTHER): Payer: Medicare Other

## 2016-10-29 VITALS — BP 140/80 | HR 66 | Temp 97.7°F | Resp 16 | Ht 74.0 in | Wt 268.4 lb

## 2016-10-29 DIAGNOSIS — C61 Malignant neoplasm of prostate: Secondary | ICD-10-CM | POA: Diagnosis not present

## 2016-10-29 DIAGNOSIS — D500 Iron deficiency anemia secondary to blood loss (chronic): Secondary | ICD-10-CM | POA: Diagnosis not present

## 2016-10-29 DIAGNOSIS — I10 Essential (primary) hypertension: Secondary | ICD-10-CM

## 2016-10-29 DIAGNOSIS — I480 Paroxysmal atrial fibrillation: Secondary | ICD-10-CM

## 2016-10-29 DIAGNOSIS — K254 Chronic or unspecified gastric ulcer with hemorrhage: Secondary | ICD-10-CM

## 2016-10-29 DIAGNOSIS — E118 Type 2 diabetes mellitus with unspecified complications: Secondary | ICD-10-CM | POA: Diagnosis not present

## 2016-10-29 DIAGNOSIS — Z23 Encounter for immunization: Secondary | ICD-10-CM

## 2016-10-29 LAB — CBC WITH DIFFERENTIAL/PLATELET
BASOS PCT: 0.7 % (ref 0.0–3.0)
Basophils Absolute: 0 10*3/uL (ref 0.0–0.1)
EOS PCT: 2.5 % (ref 0.0–5.0)
Eosinophils Absolute: 0.1 10*3/uL (ref 0.0–0.7)
HCT: 40.1 % (ref 39.0–52.0)
Hemoglobin: 13 g/dL (ref 13.0–17.0)
LYMPHS ABS: 1.3 10*3/uL (ref 0.7–4.0)
Lymphocytes Relative: 26.4 % (ref 12.0–46.0)
MCHC: 32.5 g/dL (ref 30.0–36.0)
MCV: 83.6 fl (ref 78.0–100.0)
MONO ABS: 0.4 10*3/uL (ref 0.1–1.0)
MONOS PCT: 9.2 % (ref 3.0–12.0)
NEUTROS ABS: 2.9 10*3/uL (ref 1.4–7.7)
NEUTROS PCT: 61.2 % (ref 43.0–77.0)
PLATELETS: 290 10*3/uL (ref 150.0–400.0)
RBC: 4.8 Mil/uL (ref 4.22–5.81)
RDW: 17.6 % — AB (ref 11.5–15.5)
WBC: 4.8 10*3/uL (ref 4.0–10.5)

## 2016-10-29 LAB — BASIC METABOLIC PANEL
BUN: 18 mg/dL (ref 6–23)
CO2: 29 meq/L (ref 19–32)
Calcium: 9.2 mg/dL (ref 8.4–10.5)
Chloride: 104 mEq/L (ref 96–112)
Creatinine, Ser: 0.98 mg/dL (ref 0.40–1.50)
GFR: 98.08 mL/min (ref >60.00)
GLUCOSE: 106 mg/dL — AB (ref 70–99)
POTASSIUM: 4.1 meq/L (ref 3.5–5.1)
SODIUM: 141 meq/L (ref 135–145)

## 2016-10-29 LAB — PSA: PSA: 0.07 ng/mL — ABNORMAL LOW (ref 0.10–4.00)

## 2016-10-29 LAB — HEMOGLOBIN A1C: Hgb A1c MFr Bld: 6.6 % — ABNORMAL HIGH (ref 4.6–6.5)

## 2016-10-29 MED ORDER — FLUTICASONE PROPIONATE 50 MCG/ACT NA SUSP: NASAL | 3 refills | Status: DC

## 2016-10-29 NOTE — Assessment & Plan Note (Signed)
Dr Blanch Media procedure notes were reviewed w/pt CBC

## 2016-10-29 NOTE — Assessment & Plan Note (Signed)
Labs On iron po

## 2016-10-29 NOTE — Progress Notes (Signed)
Subjective:  Patient ID: [name] [name], male    DOB: 1951-07-29  Age: 66 y.o. MRN: [id number]  CC: Follow-up (HTn)   HPI [name] [name] presents for DM, anemia, PAF f/u. Dr Blanch Media procedure notes were reviewed w/pt  Outpatient Medications Prior to Visit  Medication Sig Dispense Refill  . acetaminophen-codeine (TYLENOL #3) 300-30 MG tablet Take 1 tablet by mouth every 6 (six) hours as needed for moderate pain. 60 tablet 1  . amLODipine-olmesartan (AZOR) 10-40 MG tablet Take 1 tablet by mouth every morning. 90 tablet 3  . aspirin EC 81 MG tablet Take 81 mg by mouth daily.    . Carboxymethylcellulose Sodium (REFRESH LIQUIGEL OP) Apply 2 drops to eye as needed (for dry eyes).     . fluticasone (FLONASE) 50 MCG/ACT nasal spray USE ONE SPRAY IN EACH NOSTRIL DAILY 48 g 3  . furosemide (LASIX) 20 MG tablet Take 1-2 tablets (20-40 mg total) by mouth daily as needed for edema. 180 tablet 3  . Iron-Vitamins (GERITOL COMPLETE PO) Take 1 tablet by mouth every morning. With Iron    . metoprolol (LOPRESSOR) 50 MG tablet Take 1 tablet (50 mg total) by mouth daily. 90 tablet 3  . pantoprazole (PROTONIX) 40 MG tablet Take 1 tablet (40 mg total) by mouth 2 (two) times daily before a meal. Twice daily for 4 weeks then once daily 90 tablet 1  . pantoprazole (PROTONIX) 40 MG tablet Take 1 tablet (40 mg total) by mouth 2 (two) times daily. 28 tablet 0  . pioglitazone-metformin (ACTOPLUS MET) 15-850 MG tablet TAKE 1 TABLET BY MOUTH TWICE A DAY WITH MEAL 180 tablet 3  . pravastatin (PRAVACHOL) 40 MG tablet TAKE ONE TABLET BY MOUTH EACH EVENING 90 tablet 3  . tamsulosin (FLOMAX) 0.4 MG CAPS capsule Take 1 capsule (0.4 mg total) by mouth every morning. 90 capsule 3  . amoxicillin (AMOXIL) 500 MG tablet Take 2 tablets (1,000 mg total) by mouth 2 (two) times daily. 56 tablet 0  . clarithromycin (BIAXIN) 500 MG tablet Take 1 tablet (500 mg total) by mouth 2 (two) times daily. 28 tablet 0    Facility-Administered Medications Prior to Visit  Medication Dose Route Frequency Provider Last Rate Last Dose  . 0.9 %  sodium chloride infusion  500 mL Intravenous Continuous Irene Shipper, MD        ROS Review of Systems  Constitutional: Negative for appetite change, fatigue and unexpected weight change.  HENT: Negative for congestion, nosebleeds, sneezing, sore throat and trouble swallowing.   Eyes: Negative for itching and visual disturbance.  Respiratory: Negative for cough.   Cardiovascular: Negative for chest pain, palpitations and leg swelling.  Gastrointestinal: Negative for abdominal distention, blood in stool, diarrhea and nausea.  Genitourinary: Negative for frequency and hematuria.  Musculoskeletal: Negative for back pain, gait problem, joint swelling and neck pain.  Skin: Negative for rash.  Neurological: Negative for dizziness, tremors, speech difficulty and weakness.  Psychiatric/Behavioral: Negative for agitation, dysphoric mood and sleep disturbance. The patient is not nervous/anxious.     Objective:  BP 140/80   Pulse 66   Temp 97.7 F (36.5 C) (Oral)   Resp 16   Ht '6\' 2"'  (1.88 m)   Wt 268 lb 6 oz (121.7 kg)   SpO2 96%   BMI 34.46 kg/m   BP Readings from Last 3 Encounters:  10/29/16 140/80  10/22/16 99/60  08/16/16 (!) 124/58    Wt Readings from Last 3 Encounters:  10/29/16  268 lb 6 oz (121.7 kg)  10/22/16 273 lb (123.8 kg)  10/03/16 273 lb (123.8 kg)    Physical Exam  Constitutional: He is oriented to person, place, and time. He appears well-developed. No distress.  NAD  HENT:  Mouth/Throat: Oropharynx is clear and moist.  Eyes: Conjunctivae are normal. Pupils are equal, round, and reactive to light.  Neck: Normal range of motion. No JVD present. No thyromegaly present.  Cardiovascular: Normal rate, normal heart sounds and intact distal pulses.  Exam reveals no gallop and no friction rub.   No murmur heard. Pulmonary/Chest: Effort normal  and breath sounds normal. No respiratory distress. He has no wheezes. He has no rales. He exhibits no tenderness.  Abdominal: Soft. Bowel sounds are normal. He exhibits no distension and no mass. There is no tenderness. There is no rebound and no guarding.  Musculoskeletal: Normal range of motion. He exhibits no edema or tenderness.  Lymphadenopathy:    He has no cervical adenopathy.  Neurological: He is alert and oriented to person, place, and time. He has normal reflexes. No cranial nerve deficit. He exhibits normal muscle tone. He displays a negative Romberg sign. Coordination and gait normal.  Skin: Skin is warm and dry. No rash noted.  Psychiatric: He has a normal mood and affect. His behavior is normal. Judgment and thought content normal.  irreg irreg  Lab Results  Component Value Date   WBC 5.6 07/13/2016   HGB 10.1 (L) 07/13/2016   HCT 31.0 (L) 07/13/2016   PLT 414.0 (H) 07/13/2016   GLUCOSE 98 07/13/2016   CHOL 185 07/13/2014   TRIG 58.0 07/13/2014   HDL 45.50 07/13/2014   LDLDIRECT 125.4 07/14/2008   LDLCALC 128 (H) 07/13/2014   ALT 16 (L) 06/22/2016   AST 19 06/22/2016   NA 139 07/13/2016   K 4.0 07/13/2016   CL 104 07/13/2016   CREATININE 0.91 07/13/2016   BUN 14 07/13/2016   CO2 28 07/13/2016   TSH 1.91 08/10/2015   PSA 0.50 03/17/2014   INR 1.14 06/23/2016   HGBA1C 6.2 07/13/2016    Dg Abd 2 Views  Result Date: 06/22/2016 CLINICAL DATA:  Nausea, lightheadedness EXAM: ABDOMEN - 2 VIEW COMPARISON:  CT abdomen 01/25/2015 FINDINGS: The bowel gas pattern is normal. There is no evidence of free air. No radio-opaque calculi or other significant radiographic abnormality is seen. Prostatic radiation seeds are noted. IMPRESSION: Negative. Electronically Signed   By: Kathreen Devoid   On: 06/22/2016 15:44    Assessment & Plan:   There are no diagnoses linked to this encounter. I have discontinued Mr. Tersigni's amoxicillin and clarithromycin. I am also having him  maintain his Iron-Vitamins (GERITOL COMPLETE PO), aspirin EC, Carboxymethylcellulose Sodium (REFRESH LIQUIGEL OP), amLODipine-olmesartan, fluticasone, furosemide, metoprolol, pantoprazole, pioglitazone-metformin, pravastatin, tamsulosin, acetaminophen-codeine, and pantoprazole. We will continue to administer sodium chloride.  No orders of the defined types were placed in this encounter.    Follow-up: No Follow-up on file.  Walker Kehr, MD

## 2016-10-29 NOTE — Assessment & Plan Note (Signed)
Azor, Lopressor Pt lost wt

## 2016-10-29 NOTE — Assessment & Plan Note (Signed)
Actos, Metformin

## 2016-10-29 NOTE — Addendum Note (Signed)
Addended by: Valere Dross on: 10/29/2016 08:44 AM   Modules accepted: Orders

## 2016-10-29 NOTE — Assessment & Plan Note (Addendum)
Dr Blanch Media procedure notes were reviewed w/pt off anticoag due to GI bleeed

## 2016-10-29 NOTE — Assessment & Plan Note (Signed)
PSA

## 2016-10-29 NOTE — Progress Notes (Signed)
Pre-visit discussion using our clinic review tool. No additional management support is needed unless otherwise documented below in the visit note.  

## 2016-11-23 ENCOUNTER — Other Ambulatory Visit: Payer: Self-pay | Admitting: Internal Medicine

## 2016-11-28 NOTE — Telephone Encounter (Signed)
Called refill into pharmacy spoke w/allison gave md approval.../lmb

## 2017-01-03 ENCOUNTER — Encounter: Payer: Self-pay | Admitting: Internal Medicine

## 2017-01-03 ENCOUNTER — Ambulatory Visit (INDEPENDENT_AMBULATORY_CARE_PROVIDER_SITE_OTHER): Payer: Medicare Other | Admitting: Internal Medicine

## 2017-01-03 DIAGNOSIS — I10 Essential (primary) hypertension: Secondary | ICD-10-CM

## 2017-01-03 DIAGNOSIS — I480 Paroxysmal atrial fibrillation: Secondary | ICD-10-CM

## 2017-01-03 DIAGNOSIS — M10072 Idiopathic gout, left ankle and foot: Secondary | ICD-10-CM

## 2017-01-03 DIAGNOSIS — M79672 Pain in left foot: Secondary | ICD-10-CM

## 2017-01-03 MED ORDER — INDOMETHACIN 50 MG PO CAPS: 50.0000 mg | ORAL_CAPSULE | Freq: Two times a day (BID) | ORAL | 1 refills | Status: DC | PRN

## 2017-01-03 MED ORDER — COLCHICINE 0.6 MG PO TABS: 0.6000 mg | ORAL_TABLET | Freq: Every day | ORAL | 3 refills | Status: DC | PRN

## 2017-01-03 MED ORDER — ACETAMINOPHEN-CODEINE #3 300-30 MG PO TABS: 1.0000 | ORAL_TABLET | Freq: Four times a day (QID) | ORAL | 2 refills | Status: DC | PRN

## 2017-01-03 MED ORDER — METHYLPREDNISOLONE ACETATE 80 MG/ML IJ SUSP
80.0000 mg | Freq: Once | INTRAMUSCULAR | Status: AC
  Administered 2017-01-03: 80 mg via INTRAMUSCULAR

## 2017-01-03 MED ORDER — DOXYCYCLINE HYCLATE 100 MG PO TABS: 100.0000 mg | ORAL_TABLET | Freq: Two times a day (BID) | ORAL | 0 refills | Status: DC

## 2017-01-03 NOTE — Progress Notes (Signed)
Subjective:  Patient ID: [name] [name], male    DOB: March 18, 1951  Age: 66 y.o. MRN: [id number]  CC: No chief complaint on file.   HPI [name] [name] presents for L heel pain x 1d - severe 10/10 after a steak house dinner  Outpatient Medications Prior to Visit  Medication Sig Dispense Refill  . acetaminophen-codeine (TYLENOL #3) 300-30 MG tablet TAKE ONE (1) TABLET BY MOUTH EVERY SIX HOURS AS NEEDED FOR MODERATE PAIN 60 tablet 2  . amLODipine-olmesartan (AZOR) 10-40 MG tablet Take 1 tablet by mouth every morning. 90 tablet 3  . aspirin EC 81 MG tablet Take 81 mg by mouth daily.    . Carboxymethylcellulose Sodium (REFRESH LIQUIGEL OP) Apply 2 drops to eye as needed (for dry eyes).     . fluticasone (FLONASE) 50 MCG/ACT nasal spray USE ONE SPRAY IN EACH NOSTRIL DAILY 48 g 3  . furosemide (LASIX) 20 MG tablet Take 1-2 tablets (20-40 mg total) by mouth daily as needed for edema. 180 tablet 3  . Iron-Vitamins (GERITOL COMPLETE PO) Take 1 tablet by mouth every morning. With Iron    . metoprolol (LOPRESSOR) 50 MG tablet Take 1 tablet (50 mg total) by mouth daily. 90 tablet 3  . pantoprazole (PROTONIX) 40 MG tablet Take 1 tablet (40 mg total) by mouth 2 (two) times daily before a meal. Twice daily for 4 weeks then once daily 90 tablet 1  . pantoprazole (PROTONIX) 40 MG tablet Take 1 tablet (40 mg total) by mouth 2 (two) times daily. 28 tablet 0  . pioglitazone-metformin (ACTOPLUS MET) 15-850 MG tablet TAKE 1 TABLET BY MOUTH TWICE A DAY WITH MEAL 180 tablet 3  . pravastatin (PRAVACHOL) 40 MG tablet TAKE ONE TABLET BY MOUTH EACH EVENING 90 tablet 3  . tamsulosin (FLOMAX) 0.4 MG CAPS capsule Take 1 capsule (0.4 mg total) by mouth every morning. 90 capsule 3   Facility-Administered Medications Prior to Visit  Medication Dose Route Frequency Provider Last Rate Last Dose  . 0.9 %  sodium chloride infusion  500 mL Intravenous Continuous Irene Shipper, MD        ROS Review of Systems    Constitutional: Negative for appetite change, fatigue and unexpected weight change.  HENT: Negative for congestion, nosebleeds, sneezing, sore throat and trouble swallowing.   Eyes: Negative for itching and visual disturbance.  Respiratory: Negative for cough.   Cardiovascular: Negative for chest pain, palpitations and leg swelling.  Gastrointestinal: Negative for abdominal distention, blood in stool, diarrhea and nausea.  Genitourinary: Negative for frequency and hematuria.  Musculoskeletal: Positive for arthralgias. Negative for back pain, gait problem, joint swelling and neck pain.  Skin: Positive for color change. Negative for rash.  Neurological: Negative for dizziness, tremors, speech difficulty and weakness.  Psychiatric/Behavioral: Negative for agitation, dysphoric mood and sleep disturbance. The patient is not nervous/anxious.     Objective:  BP (!) 142/74 (BP Location: Left Arm, Patient Position: Sitting, Cuff Size: Large)   Pulse 71   Temp 98.1 F (36.7 C) (Oral)   Ht '6\' 2"'  (1.88 m)   Wt 277 lb 1.3 oz (125.7 kg)   SpO2 99%   BMI 35.57 kg/m   BP Readings from Last 3 Encounters:  01/03/17 (!) 142/74  10/29/16 140/80  10/22/16 99/60    Wt Readings from Last 3 Encounters:  01/03/17 277 lb 1.3 oz (125.7 kg)  10/29/16 268 lb 6 oz (121.7 kg)  10/22/16 273 lb (123.8 kg)    Physical  Exam  Constitutional: He is oriented to person, place, and time. He appears well-developed. No distress.  NAD  HENT:  Mouth/Throat: Oropharynx is clear and moist.  Eyes: Conjunctivae are normal. Pupils are equal, round, and reactive to light.  Neck: Normal range of motion. No JVD present. No thyromegaly present.  Cardiovascular: Normal rate, regular rhythm, normal heart sounds and intact distal pulses.  Exam reveals no gallop and no friction rub.   No murmur heard. Pulmonary/Chest: Effort normal and breath sounds normal. No respiratory distress. He has no wheezes. He has no rales. He  exhibits no tenderness.  Abdominal: Soft. Bowel sounds are normal. He exhibits no distension and no mass. There is no tenderness. There is no rebound and no guarding.  Musculoskeletal: Normal range of motion. He exhibits tenderness. He exhibits no edema.  Lymphadenopathy:    He has no cervical adenopathy.  Neurological: He is alert and oriented to person, place, and time. He has normal reflexes. No cranial nerve deficit. He exhibits normal muscle tone. He displays a negative Romberg sign. Coordination and gait normal.  Skin: Skin is warm and dry. No rash noted. There is erythema.  Psychiatric: He has a normal mood and affect. His behavior is normal. Judgment and thought content normal.  L inner foot and arch w/erythema and it is very tender  Lab Results  Component Value Date   WBC 4.8 10/29/2016   HGB 13.0 10/29/2016   HCT 40.1 10/29/2016   PLT 290.0 10/29/2016   GLUCOSE 106 (H) 10/29/2016   CHOL 185 07/13/2014   TRIG 58.0 07/13/2014   HDL 45.50 07/13/2014   LDLDIRECT 125.4 07/14/2008   LDLCALC 128 (H) 07/13/2014   ALT 16 (L) 06/22/2016   AST 19 06/22/2016   NA 141 10/29/2016   K 4.1 10/29/2016   CL 104 10/29/2016   CREATININE 0.98 10/29/2016   BUN 18 10/29/2016   CO2 29 10/29/2016   TSH 1.91 08/10/2015   PSA 0.07 (L) 10/29/2016   INR 1.14 06/23/2016   HGBA1C 6.6 (H) 10/29/2016    Dg Abd 2 Views  Result Date: 06/22/2016 CLINICAL DATA:  Nausea, lightheadedness EXAM: ABDOMEN - 2 VIEW COMPARISON:  CT abdomen 01/25/2015 FINDINGS: The bowel gas pattern is normal. There is no evidence of free air. No radio-opaque calculi or other significant radiographic abnormality is seen. Prostatic radiation seeds are noted. IMPRESSION: Negative. Electronically Signed   By: Kathreen Devoid   On: 06/22/2016 15:44    Assessment & Plan:   There are no diagnoses linked to this encounter. I am having Mr. Aoun maintain his Iron-Vitamins (GERITOL COMPLETE PO), aspirin EC, Carboxymethylcellulose  Sodium (REFRESH LIQUIGEL OP), amLODipine-olmesartan, furosemide, metoprolol, pantoprazole, pioglitazone-metformin, pravastatin, tamsulosin, pantoprazole, fluticasone, and acetaminophen-codeine. We will continue to administer sodium chloride.  No orders of the defined types were placed in this encounter.    Follow-up: No Follow-up on file.  Walker Kehr, MD

## 2017-01-03 NOTE — Progress Notes (Signed)
Pre visit review using our clinic review tool, if applicable. No additional management support is needed unless otherwise documented below in the visit note. 

## 2017-01-03 NOTE — Assessment & Plan Note (Signed)
Lopressor, Azor 

## 2017-01-03 NOTE — Patient Instructions (Signed)
Take Doxycycline if redness is spreading

## 2017-01-03 NOTE — Assessment & Plan Note (Signed)
Depomedrol 80 mg IM Colchicine, Indocin prn T#3 prn Take Doxycycline if redness is spreading

## 2017-01-03 NOTE — Assessment & Plan Note (Signed)
Lopressor and Azor

## 2017-01-03 NOTE — Assessment & Plan Note (Addendum)
Depomedrol 80 mg IM Colchicine, Indocin prn T#3 prn Take Doxycycline if redness is spreading

## 2017-01-25 ENCOUNTER — Ambulatory Visit: Payer: Medicare Other | Admitting: Internal Medicine

## 2017-02-26 ENCOUNTER — Other Ambulatory Visit: Payer: Self-pay | Admitting: Internal Medicine

## 2017-03-30 ENCOUNTER — Other Ambulatory Visit: Payer: Self-pay | Admitting: Internal Medicine

## 2017-05-11 ENCOUNTER — Other Ambulatory Visit: Payer: Self-pay | Admitting: Internal Medicine

## 2017-08-02 ENCOUNTER — Other Ambulatory Visit: Payer: Self-pay | Admitting: Pharmacist

## 2017-08-02 NOTE — Patient Outreach (Addendum)
Incoming call from [name] in response to the [facility] Medication Adherence Campaign. Speak with patient. HIPAA identifiers verified and verbal consent received.  [name] [name] reports that he takes his pioglitazone-metformin twice daily as directed. Denies any missed doses or barriers to taking his medications. Patient reviews pill bottle and notes that he has medication left, but is out of refills and needs an office visit for future refills. Patient reports that he has not yet called to schedule an appointment. Advise patient to call now to his PCP's office to schedule and appointment and request that a refill of his pioglitazone-metformin be called into his pharmacy to last him until this appointment. [name] states that he will place this call as soon as we hang up.  Patient denies any further medication questions/concerns at this time.  Harlow Asa, PharmD, [facility] [phone]

## 2017-08-15 ENCOUNTER — Other Ambulatory Visit: Payer: Self-pay | Admitting: Internal Medicine

## 2017-08-23 ENCOUNTER — Ambulatory Visit (INDEPENDENT_AMBULATORY_CARE_PROVIDER_SITE_OTHER): Payer: Medicare Other | Admitting: *Deleted

## 2017-08-23 VITALS — BP 148/78 | HR 74 | Resp 20 | Ht 74.0 in | Wt 276.0 lb

## 2017-08-23 DIAGNOSIS — E118 Type 2 diabetes mellitus with unspecified complications: Secondary | ICD-10-CM

## 2017-08-23 DIAGNOSIS — Z0000 Encounter for general adult medical examination without abnormal findings: Secondary | ICD-10-CM | POA: Diagnosis not present

## 2017-08-23 MED ORDER — FLUTICASONE PROPIONATE 50 MCG/ACT NA SUSP: NASAL | 3 refills | Status: DC

## 2017-08-23 MED ORDER — BLOOD GLUCOSE MONITOR KIT: PACK | 0 refills | Status: AC

## 2017-08-23 MED ORDER — INDOMETHACIN 50 MG PO CAPS: ORAL_CAPSULE | ORAL | 0 refills | Status: DC

## 2017-08-23 NOTE — Progress Notes (Signed)
Subjective:   [name] [name] is a 66 y.o. male who presents for an Initial Medicare Annual Wellness Visit.  Review of Systems  No ROS.  Medicare Wellness Visit. Additional risk factors are reflected in the social history.  Cardiac Risk Factors include: advanced age (>76mn, >>72women);diabetes mellitus;dyslipidemia;hypertension;male gender Sleep patterns: feels rested on waking, gets up 2-3 times nightly to void and sleeps 7 hours nightly.   Home Safety/Smoke Alarms: Feels safe in home. Smoke alarms in place.  Living environment; residence and Firearm Safety: 1-story house/ trailer, no firearms. Seat Belt Safety/Bike Helmet: Wears seat belt.     Objective:    Today's Vitals   08/23/17 0911  BP: (!) 148/78  Pulse: 74  Resp: 20  SpO2: 99%  Weight: 276 lb (125.2 kg)  Height: _0  (1.88 m)   Body mass index is 35.44 kg/m.  Advanced Directives 08/23/2017 10/03/2016 08/02/2016 06/22/2016 02/24/2016 02/01/2016 01/12/2016  Does Patient Have a Medical Advance Directive? _1  Yes Yes  Type of AParamedicof ASour LakeLiving will HPoint ArenaLiving will H[place]innstonLiving will Living will HBlue Berry HillLiving will Living will;Healthcare Power of Attorney Living will;Healthcare Power of Attorney  Does patient want to make changes to medical advance directive? - - - No - Patient declined No - Patient declined - No - Patient declined  Copy of HAsh [place] Chart? No - copy requested - - No - copy requested No - copy requested - No - copy requested  Would patient like information on creating a medical advance directive? - - - - - - -    Current Medications (verified) Outpatient Encounter Medications as of 08/23/2017  Medication Sig  . acetaminophen-codeine (TYLENOL #3) 300-30 MG tablet Take 1-2 tablets by mouth every 6 (six) hours as needed for moderate pain.  .Marland KitchenamLODipine-olmesartan  (AZOR) 10-40 MG tablet Take 1 tablet by mouth every morning.  .Marland Kitchenaspirin EC 81 MG tablet Take 81 mg by mouth daily.  . Carboxymethylcellulose Sodium (REFRESH LIQUIGEL OP) Apply 2 drops to eye as needed (for dry eyes).   . colchicine 0.6 MG tablet Take 1 tablet (0.6 mg total) by mouth daily as needed. For gout  . fluticasone (FLONASE) 50 MCG/ACT nasal spray USE ONE SPRAY IN EACH NOSTRIL DAILY  . furosemide (LASIX) 20 MG tablet Take 1-2 tablets (20-40 mg total) by mouth daily as needed for edema.  . indomethacin (INDOCIN) 50 MG capsule TAKE ONE CAPSULE BY MOUTH TWICE DAILY ASNEEDED FOR GOUT.  Patient needs office visit before refills will be given  . Iron-Vitamins (GERITOL COMPLETE PO) Take 1 tablet by mouth every morning. With Iron  . metoprolol (LOPRESSOR) 50 MG tablet Take 1 tablet (50 mg total) by mouth daily.  . pantoprazole (PROTONIX) 40 MG tablet Take 1 tablet (40 mg total) by mouth 2 (two) times daily before a meal. Twice daily for 4 weeks then once daily  . pioglitazone-metformin (ACTOPLUS MET) 15-850 MG tablet TAKE 1 TABLET BY MOUTH TWICE A DAY WITH A MEAL.  . pravastatin (PRAVACHOL) 40 MG tablet TAKE ONE TABLET BY MOUTH EACH EVENING  . tamsulosin (FLOMAX) 0.4 MG CAPS capsule Take 1 capsule (0.4 mg total) by mouth every morning.  . [DISCONTINUED] fluticasone (FLONASE) 50 MCG/ACT nasal spray USE ONE SPRAY IN EACH NOSTRIL DAILY  . [DISCONTINUED] indomethacin (INDOCIN) 50 MG capsule TAKE ONE CAPSULE BY MOUTH TWICE DAILY ASNEEDED FOR GOUT.  Patient needs office visit  before refills will be given  . [DISCONTINUED] doxycycline (VIBRA-TABS) 100 MG tablet Take 1 tablet (100 mg total) by mouth 2 (two) times daily. (Patient not taking: Reported on 08/23/2017)  . [DISCONTINUED] pantoprazole (PROTONIX) 40 MG tablet Take 1 tablet (40 mg total) by mouth 2 (two) times daily. (Patient not taking: Reported on 08/23/2017)   Facility-Administered Encounter Medications as of 08/23/2017  Medication  . 0.9 %   sodium chloride infusion    Allergies (verified) Benazepril hcl and Sildenafil   History: Past Medical History:  Diagnosis Date  . Allergy   . Bilateral renal cysts    benign  . COPD (chronic obstructive pulmonary disease) ([place])     PFT 2010 due to previous 20y h/o smoking  . DDD (degenerative disc disease), lumbar   . Diabetes mellitus type II   . ED (erectile dysfunction) of organic origin   . First degree heart block   . History of colon polyps   . History of prostate cancer current PSA per pt 0.13   dx March 2014---  s/p radiactive prostate seed implants and intraop radiotherapy in [place], [place]  . Hyperlipidemia   . Hypertension   . OSA on CPAP   . Osteoarthritis   . PAF (paroxysmal atrial fibrillation) [facility])    cardiologist--  dr hochrein  (episode prior to liver surgery at [facility] 2014 w/ post-op RVR)  . Prostate cancer ([place]) 11-11-2012   s/p chemo, radiation   . Rectal cancer ([place])    01-25-2015----per pathology --  complete rectal polypectomy-- showed  invasive carcinoma rising in tubular adnemoa high grade hyperplasia--  per dr Ardis Hughs per MRI  no other area seen -- will have colonoscopy done in 3 months  . Sleep apnea    wears cpap  . Ulcer   . Wears glasses    Past Surgical History:  Procedure Laterality Date  .  RIGHT LOBE HEPATECTOMY  06-12-2013    [facility]   benign mass  . CARDIOVASCULAR STRESS TEST  10-01-2014   dr hochrein   normal perfusion study, no ischemia,  normal LV function and wall motion, ef 70%  . COLONOSCOPY    . ESOPHAGOGASTRODUODENOSCOPY N/A 06/23/2016   Procedure: ESOPHAGOGASTRODUODENOSCOPY (EGD);  Surgeon: Manus Gunning, MD;  Location: [place];  Service: Gastroenterology;  Laterality: N/A;  . EUS N/A 02/10/2015   Procedure: LOWER ENDOSCOPIC ULTRASOUND (EUS);  Surgeon: Milus Banister, MD;  Location: [facility];  Service: Endoscopy;  Laterality: N/A;  . KNEE ARTHROSCOPY Bilateral 1990's  . LUMBAR FUSION  2001  . PENILE  PROSTHESIS IMPLANT N/A 04/25/2015   Procedure: PENILE PROSTHESIS THREE PIECE INFLATABLE( COLOPLAST) SCROTAL APPROACH;  Surgeon: Kathie Rhodes, MD;  Location: [place];  Service: Urology;  Laterality: N/A;  . POLYPECTOMY    . RADIOACTIVE PROSTATE SEED IMPLANTS  May 2014  in [place] Right 02/21/2016  . TOTAL KNEE ARTHROPLASTY Left 10- 2013  . TRANSTHORACIC ECHOCARDIOGRAM  12-26-2012   mild focal basal septal hypertrophy,  grade II diastolic dysfunction,  ef 55-60%,  mild LAE,  trivial MR and TR  . UPPER GASTROINTESTINAL ENDOSCOPY    . VASECTOMY  1980's w/ gen. anes.   Family History  Problem Relation Age of Onset  . Diabetes Mother   . Heart disease Mother        Pacemaker  . Pancreatic cancer Father   . Stomach cancer Father   . Stomach cancer Sister   . Pancreatic cancer  Sister   . Hypertension Other   . Colon polyps Neg Hx   . Rectal cancer Neg Hx   . Esophageal cancer Neg Hx   . Colon cancer Neg Hx    Social History   Socioeconomic History  . Marital status: Married    Spouse name: None  . Number of children: 2  . Years of education: None  . Highest education level: None  Social Needs  . Financial resource strain: Not hard at all  . Food insecurity - worry: Never true  . Food insecurity - inability: Never true  . Transportation needs - medical: No  . Transportation needs - non-medical: No  Occupational History  . Occupation: [employer]    Comment: Retired  . Occupation: teaching  Tobacco Use  . Smoking status: Former Smoker    Packs/day: 2.00    Years: 15.00    Pack years: 30.00    Types: Cigarettes    Last attempt to quit: 09/04/1999    Years since quitting: 17.9  . Smokeless tobacco: Never Used  Substance and Sexual Activity  . Alcohol use: Yes    Alcohol/week: 0.0 oz    Comment: Occasional  . Drug use: No  . Sexual activity: None  Other Topics Concern  . None  Social History Narrative   Regular exercise- yes,  walking   Tobacco Counseling Counseling given: Not Answered  Activities of Daily Living In your present state of health, do you have any difficulty performing the following activities: 08/23/2017  Hearing? N  Vision? N  Difficulty concentrating or making decisions? N  Walking or climbing stairs? N  Dressing or bathing? N  Doing errands, shopping? N  Preparing Food and eating ? N  Using the Toilet? N  In the past six months, have you accidently leaked urine? N  Do you have problems with loss of bowel control? N  Managing your Medications? N  Managing your Finances? N  Housekeeping or managing your Housekeeping? N  Some recent data might be hidden     Immunizations and Health Maintenance Immunization History  Administered Date(s) Administered  . Influenza Split 06/04/2011  . Influenza Whole 05/04/2012  . Influenza,inj,Quad PF,6+ Mos 05/25/2013  . Influenza-Unspecified 05/18/2012, 06/01/2014, 05/05/2015, 06/20/2016  . Pneumococcal Conjugate-13 05/19/2011, 10/29/2016  . Pneumococcal Polysaccharide-23 11/08/2009, 06/23/2016   Health Maintenance Due  Topic Date Due  . Hepatitis C Screening  [birth year]  . FOOT EXAM  09/13/1959  . OPHTHALMOLOGY EXAM  09/13/1959  . TETANUS/TDAP  09/12/1968  . INFLUENZA VACCINE  04/03/2017  . HEMOGLOBIN A1C  04/28/2017  . COLONOSCOPY  08/16/2017    Patient Care Team: Cassandria Anger, MD as PCP - General Irene Shipper, MD as Attending Physician (Gastroenterology) Alexis Frock, MD as Attending Physician (Urology)  Indicate any recent Medical Services you may have received from other than [facility] providers in the past year (date may be approximate).    Assessment:   This is a routine wellness examination for [name] [name]. Physical assessment deferred to PCP.   Hearing/Vision screen Hearing Screening Comments: Able to hear conversational tones w/o difficulty. No issues reported.  Passed whisper test, gets hearing test through [place]: [facility]Soloman  Dietary issues and exercise activities discussed: Current Exercise Habits: Home exercise routine, Type of exercise: walking;treadmill, Time (Minutes): 60, Frequency (Times/Week): 5, Weekly Exercise (Minutes/Week): 300, Intensity: Mild, Exercise limited by: orthopedic condition(s) Diet (meal preparation, eat out, water intake, caffeinated beverages, dairy products, fruits  and vegetables): in general, a "healthy" diet  , well balanced, eats a variety of fruits and vegetables daily, limits salt, fat/cholesterol, sugar, caffeine, drinks 6-8 glasses of water daily.   Goals    . Patient Stated     Maintain current health status, continue to exercise eat healthy, Worship God, enjoy life and family.      Depression Screen PHQ 2/9 Scores 08/23/2017 11/11/2014  PHQ - 2 Score 1 0  PHQ- 9 Score 1 -    Fall Risk Fall Risk  08/23/2017 11/11/2014  Falls in the past year? Yes No  Number falls in past yr: 1 -  Injury with Fall? No -   Cognitive Function: MMSE - Mini Mental State Exam 08/23/2017  Orientation to time 5  Orientation to Place 5  Registration 3  Attention/ Calculation 5  Recall 2  Language- name 2 objects 2  Language- repeat 1  Language- follow 3 step command 3  Language- read & follow direction 1  Write a sentence 1  Copy design 1  Total score 29        Screening Tests Health Maintenance  Topic Date Due  . Hepatitis C Screening  12-07-1950  . FOOT EXAM  09/13/1959  . OPHTHALMOLOGY EXAM  09/13/1959  . TETANUS/TDAP  09/12/1968  . INFLUENZA VACCINE  04/03/2017  . HEMOGLOBIN A1C  04/28/2017  . COLONOSCOPY  08/16/2017  . PNA vac Low Risk Adult  Completed      Plan:    Continue doing brain stimulating activities (puzzles, reading, adult coloring books, staying active) to keep memory sharp.   Continue to eat heart healthy diet (full of fruits, vegetables, whole grains, lean protein, water--limit salt,  fat, and sugar intake) and increase physical activity as tolerated.  I have personally reviewed and noted the following in the patient's chart:   . Medical and social history . Use of alcohol, tobacco or illicit drugs  . Current medications and supplements . Functional ability and status . Nutritional status . Physical activity . Advanced directives . List of other physicians . Vitals . Screenings to include cognitive, depression, and falls . Referrals and appointments  In addition, I have reviewed and discussed with patient certain preventive protocols, quality metrics, and best practice recommendations. A written personalized care plan for preventive services as well as general preventive health recommendations were provided to patient.     Michiel Cowboy, RN   08/23/2017

## 2017-08-23 NOTE — Patient Instructions (Addendum)
Be on lookout for letter from Dr. [name].  Continue doing brain stimulating activities (puzzles, reading, adult coloring books, staying active) to keep memory sharp.   Continue to eat heart healthy diet (full of fruits, vegetables, whole grains, lean protein, water--limit salt, fat, and sugar intake) and increase physical activity as tolerated.   [name] [name] , Thank you for taking time to come for your Medicare Wellness Visit. I appreciate your ongoing commitment to your health goals. Please review the following plan we discussed and let me know if I can assist you in the future.   These are the goals we discussed: Goals    . Patient Stated     Maintain current health status, continue to exercise eat healthy, Worship God, enjoy life and family.       This is a list of the screening recommended for you and due dates:  Health Maintenance  Topic Date Due  .  Hepatitis C: One time screening is recommended by Center for Disease Control  (CDC) for  adults born from [birth year] through 1966.   September 30, 1950  . Complete foot exam   09/13/1959  . Eye exam for diabetics  09/13/1959  . Tetanus Vaccine  09/12/1968  . Flu Shot  04/03/2017  . Hemoglobin A1C  04/28/2017  . Colon Cancer Screening  08/16/2017  . Pneumonia vaccines  Completed

## 2017-09-03 HISTORY — PX: COLONOSCOPY: SHX174

## 2017-09-06 ENCOUNTER — Encounter: Payer: Self-pay | Admitting: Internal Medicine

## 2017-09-16 ENCOUNTER — Other Ambulatory Visit: Payer: Self-pay | Admitting: Internal Medicine

## 2017-09-17 ENCOUNTER — Encounter: Payer: Self-pay | Admitting: Internal Medicine

## 2017-11-14 ENCOUNTER — Ambulatory Visit (AMBULATORY_SURGERY_CENTER): Payer: Self-pay

## 2017-11-14 VITALS — Ht 74.0 in | Wt 270.0 lb

## 2017-11-14 DIAGNOSIS — Z86010 Personal history of colonic polyps: Secondary | ICD-10-CM

## 2017-11-14 DIAGNOSIS — Z85038 Personal history of other malignant neoplasm of large intestine: Secondary | ICD-10-CM

## 2017-11-14 DIAGNOSIS — Z860101 Personal history of adenomatous and serrated colon polyps: Secondary | ICD-10-CM

## 2017-11-14 MED ORDER — NA SULFATE-K SULFATE-MG SULF 17.5-3.13-1.6 GM/177ML PO SOLN: 1.0000 | Freq: Once | ORAL | 0 refills | Status: AC

## 2017-11-14 NOTE — Progress Notes (Signed)
Per pt, no allergies to soy or egg products.Pt not taking any weight loss meds or using  O2 at home.  Pt refused emmi video. 

## 2017-11-29 ENCOUNTER — Other Ambulatory Visit: Payer: Self-pay

## 2017-11-29 ENCOUNTER — Ambulatory Visit (AMBULATORY_SURGERY_CENTER): Payer: Medicare Other | Admitting: Internal Medicine

## 2017-11-29 ENCOUNTER — Encounter: Payer: Self-pay | Admitting: Internal Medicine

## 2017-11-29 VITALS — BP 113/49 | HR 63 | Temp 97.8°F | Resp 14 | Ht 74.0 in | Wt 270.0 lb

## 2017-11-29 DIAGNOSIS — Z86010 Personal history of colonic polyps: Secondary | ICD-10-CM

## 2017-11-29 DIAGNOSIS — D128 Benign neoplasm of rectum: Secondary | ICD-10-CM | POA: Diagnosis not present

## 2017-11-29 DIAGNOSIS — Z8500 Personal history of malignant neoplasm of unspecified digestive organ: Secondary | ICD-10-CM | POA: Diagnosis not present

## 2017-11-29 MED ORDER — SODIUM CHLORIDE 0.9 % IV SOLN: 500.0000 mL | Freq: Once | INTRAVENOUS | Status: DC

## 2017-11-29 NOTE — Patient Instructions (Signed)
Discharge instructions given. Handout on polyps. Resume previous medications. YOU HAD AN ENDOSCOPIC PROCEDURE TODAY AT THE [facility]:   Refer to the procedure report that was given to you for any specific questions about what was found during the examination.  If the procedure report does not answer your questions, please call your gastroenterologist to clarify.  If you requested that your care partner not be given the details of your procedure findings, then the procedure report has been included in a sealed envelope for you to review at your convenience later.  YOU SHOULD EXPECT: Some feelings of bloating in the abdomen. Passage of more gas than usual.  Walking can help get rid of the air that was put into your GI tract during the procedure and reduce the bloating. If you had a lower endoscopy (such as a colonoscopy or flexible sigmoidoscopy) you may notice spotting of blood in your stool or on the toilet paper. If you underwent a bowel prep for your procedure, you may not have a normal bowel movement for a few days.  Please Note:  You might notice some irritation and congestion in your nose or some drainage.  This is from the oxygen used during your procedure.  There is no need for concern and it should clear up in a day or so.  SYMPTOMS TO REPORT IMMEDIATELY:   Following lower endoscopy (colonoscopy or flexible sigmoidoscopy):  Excessive amounts of blood in the stool  Significant tenderness or worsening of abdominal pains  Swelling of the abdomen that is new, acute  Fever of 100F or higher   For urgent or emergent issues, a gastroenterologist can be reached at any hour by calling [phone].   DIET:  We do recommend a small meal at first, but then you may proceed to your regular diet.  Drink plenty of fluids but you should avoid alcoholic beverages for 24 hours.  ACTIVITY:  You should plan to take it easy for the rest of today and you should NOT DRIVE or use heavy  machinery until tomorrow (because of the sedation medicines used during the test).    FOLLOW UP: Our staff will call the number listed on your records the next business day following your procedure to check on you and address any questions or concerns that you may have regarding the information given to you following your procedure. If we do not reach you, we will leave a message.  However, if you are feeling well and you are not experiencing any problems, there is no need to return our call.  We will assume that you have returned to your regular daily activities without incident.  If any biopsies were taken you will be contacted by phone or by letter within the next 1-3 weeks.  Please call us at [phone] if you have not heard about the biopsies in 3 weeks.    SIGNATURES/CONFIDENTIALITY: You and/or your care partner have signed paperwork which will be entered into your electronic medical record.  These signatures attest to the fact that that the information above on your After Visit Summary has been reviewed and is understood.  Full responsibility of the confidentiality of this discharge information lies with you and/or your care-partner. 

## 2017-11-29 NOTE — Progress Notes (Signed)
To PACU, VSS. Report to RN.tb 

## 2017-11-29 NOTE — Op Note (Signed)
[place] Patient Name: [name] [name] Procedure Date: 11/29/2017 9:43 AM MRN: [id number] Endoscopist: Docia Chuck. [name] , MD Age: 67 Referring MD:  Date of Birth: 1951/08/21 Gender: Male Account #: [id number] Procedure:                Colonoscopy, with hot snare polypectomy x 1 Indications:              High risk colon cancer surveillance: Personal                            history of adenoma (10 mm or greater in size), High                            risk colon cancer surveillance: Personal history of                            multiple (3 or more) adenomas, High risk colon                            cancer surveillance: Personal history of colon                            cancer. Patient with a history of multiple and                            advanced adenomas including malignant rectal polyp                            at the anal verge 2016. Residual tissue 2017.                            Subsequent flexible sigmoidoscopy in 2017 negative Medicines:                Monitored Anesthesia Care Procedure:                Pre-Anesthesia Assessment:                           - Prior to the procedure, a History and Physical                            was performed, and patient medications and                            allergies were reviewed. The patient's tolerance of                            previous anesthesia was also reviewed. The risks                            and benefits of the procedure and the sedation                            options and risks were discussed with the patient.  All questions were answered, and informed consent                            was obtained. Prior Anticoagulants: The patient has                            taken no previous anticoagulant or antiplatelet                            agents. ASA Grade Assessment: II - A patient with                            mild systemic disease. After reviewing the risks                       and benefits, the patient was deemed in                            satisfactory condition to undergo the procedure.                           After obtaining informed consent, the colonoscope                            was passed under direct vision. Throughout the                            procedure, the patient's blood pressure, pulse, and                            oxygen saturations were monitored continuously. The                            Colonoscope was introduced through the anus and                            advanced to the the cecum, identified by                            appendiceal orifice and ileocecal valve. The                            ileocecal valve, appendiceal orifice, and rectum                            were photographed. The quality of the bowel                            preparation was excellent. The colonoscopy was                            performed without difficulty. The patient tolerated  the procedure well. The bowel preparation used was                            SUPREP. Scope In: 9:51:29 AM Scope Out: 10:15:52 AM Scope Withdrawal Time: 0 hours 5 minutes 29 seconds  Total Procedure Duration: 0 hours 24 minutes 23 seconds  Findings:                 A 5 mm polyp was found in the rectum, at the anal                            verge, suspect from previous polypectomy region.                            The polyp was removed with a hot snare. Resection                            and retrieval were complete.                           The exam was otherwise without abnormality on                            direct and retroflexion views. Complications:            No immediate complications. Estimated blood loss:                            None. Estimated Blood Loss:     Estimated blood loss: none. Impression:               - One 5 mm polyp in the rectum, removed with a hot                            snare.  Resected and retrieved.                           - The examination was otherwise normal on direct                            and retroflexion views. Recommendation:           - Repeat surveillance in 1 year with flexible                            sigmoidoscopy if polyp benign.                           - Patient has a contact number available for                            emergencies. The signs and symptoms of potential                            delayed complications were discussed with the  patient. Return to normal activities tomorrow.                            Written discharge instructions were provided to the                            patient.                           - Resume previous diet.                           - Continue present medications.                           - Await pathology results. Docia Chuck. [name], MD 11/29/2017 10:22:56 AM This report has been signed electronically.

## 2017-11-29 NOTE — Progress Notes (Signed)
Pt's states no medical or surgical changes since previsit or office visit. 

## 2017-11-29 NOTE — Progress Notes (Signed)
Called to room to assist during endoscopic procedure.  Patient ID and intended procedure confirmed with present staff. Received instructions for my participation in the procedure from the performing physician.  

## 2017-12-02 ENCOUNTER — Telehealth: Payer: Self-pay

## 2017-12-02 NOTE — Telephone Encounter (Signed)
  Follow up Call-  Call back number 11/29/2017 10/22/2016 08/16/2016 02/15/2016  Post procedure Call Back phone  # [phone] wife's cell  Permission to leave phone message Yes Yes Yes Yes  Some recent data might be hidden     Patient questions:  Do you have a fever, pain , or abdominal swelling? No. Pain Score  0 *  Have you tolerated food without any problems? Yes.    Have you been able to return to your normal activities? Yes.    Do you have any questions about your discharge instructions: Diet   No. Medications  No. Follow up visit  No.  Do you have questions or concerns about your Care? No.  Actions: * If pain score is 4 or above: No action needed, pain <4.

## 2017-12-03 ENCOUNTER — Encounter: Payer: Self-pay | Admitting: Internal Medicine

## 2017-12-03 ENCOUNTER — Telehealth: Payer: Self-pay | Admitting: Internal Medicine

## 2017-12-03 NOTE — Telephone Encounter (Addendum)
Pt reguesting refills on :  Amlodipine-olmesartan  10-40 mg, Pioglitazone-metformin 15-850 mg, Metoprolol tartrate 50 mg  Last HA1C was 10/29/16 of 6.6  LOV  01/03/17 with Dr. Alain Marion                                              And 08/23/17 with clinical support with Emelia Loron, RN  [facility]  08/25/18  Pharmacy is  [facility] [phone],  [address]  Please review.

## 2017-12-03 NOTE — Telephone Encounter (Signed)
Copied from [place] [id number]. Topic: Quick Communication - Rx Refill/Question >> Dec 03, 2017 11:06 AM Oliver Pila B wrote: Medication: pantoprazole (PROTONIX) 40 MG tablet [[id number]] DISCONTINUED , amLODipine-olmesartan (AZOR) 10-40 MG tablet [[id number]] , metformin, metoprolol tartrate (LOPRESSOR) 50 MG tablet [[id number]]  Has the patient contacted their pharmacy? Yes.   (Agent: If no, request that the patient contact the pharmacy for the refill.) Preferred Pharmacy (with phone number or street name): [facility] Agent: Please be advised that RX refills may take up to 3 business days. We ask that you follow-up with your pharmacy.

## 2017-12-03 NOTE — Telephone Encounter (Signed)
LVM for patient to sch his CPE, can meds be filled

## 2017-12-04 MED ORDER — AMLODIPINE-OLMESARTAN 10-40 MG PO TABS: 1.0000 | ORAL_TABLET | Freq: Every morning | ORAL | 0 refills | Status: DC

## 2017-12-04 MED ORDER — PIOGLITAZONE HCL-METFORMIN HCL 15-850 MG PO TABS: ORAL_TABLET | ORAL | 0 refills | Status: DC

## 2017-12-04 MED ORDER — METOPROLOL TARTRATE 50 MG PO TABS: 50.0000 mg | ORAL_TABLET | Freq: Every day | ORAL | 0 refills | Status: DC

## 2017-12-04 NOTE — Telephone Encounter (Signed)
sent a 30 day rx to the pharmacy pt must see provider for annual appt for future refills.Marland KitchenJohny [name]

## 2017-12-27 ENCOUNTER — Ambulatory Visit (INDEPENDENT_AMBULATORY_CARE_PROVIDER_SITE_OTHER): Payer: Medicare Other | Admitting: Internal Medicine

## 2017-12-27 ENCOUNTER — Encounter: Payer: Self-pay | Admitting: Internal Medicine

## 2017-12-27 VITALS — BP 110/64 | HR 64 | Temp 98.0°F | Ht 74.0 in | Wt 261.0 lb

## 2017-12-27 DIAGNOSIS — Z23 Encounter for immunization: Secondary | ICD-10-CM

## 2017-12-27 DIAGNOSIS — Z0000 Encounter for general adult medical examination without abnormal findings: Secondary | ICD-10-CM | POA: Diagnosis not present

## 2017-12-27 DIAGNOSIS — C61 Malignant neoplasm of prostate: Secondary | ICD-10-CM

## 2017-12-27 DIAGNOSIS — E785 Hyperlipidemia, unspecified: Secondary | ICD-10-CM

## 2017-12-27 DIAGNOSIS — E118 Type 2 diabetes mellitus with unspecified complications: Secondary | ICD-10-CM | POA: Diagnosis not present

## 2017-12-27 DIAGNOSIS — R1901 Right upper quadrant abdominal swelling, mass and lump: Secondary | ICD-10-CM

## 2017-12-27 DIAGNOSIS — D500 Iron deficiency anemia secondary to blood loss (chronic): Secondary | ICD-10-CM

## 2017-12-27 DIAGNOSIS — M10072 Idiopathic gout, left ankle and foot: Secondary | ICD-10-CM

## 2017-12-27 DIAGNOSIS — R160 Hepatomegaly, not elsewhere classified: Secondary | ICD-10-CM

## 2017-12-27 MED ORDER — INDOMETHACIN 50 MG PO CAPS: 50.0000 mg | ORAL_CAPSULE | Freq: Three times a day (TID) | ORAL | 3 refills | Status: DC | PRN

## 2017-12-27 NOTE — Assessment & Plan Note (Signed)
Labs

## 2017-12-27 NOTE — Assessment & Plan Note (Signed)
F/u w/Dr Tresa Moore and PSA q 6 mo

## 2017-12-27 NOTE — Assessment & Plan Note (Signed)
On Actos/Metformin

## 2017-12-27 NOTE — Assessment & Plan Note (Signed)
Abd US 

## 2017-12-27 NOTE — Assessment & Plan Note (Signed)
No relapse 

## 2017-12-27 NOTE — Assessment & Plan Note (Addendum)
We discussed age appropriate health related issues, including available/recomended screening tests and vaccinations. We discussed a need for adhering to healthy diet and exercise. Labs were ordered to be later reviewed . All questions were answered.  F/u w/Dr Tresa Moore and PSA q 6 mo Eye exam q 12 mo

## 2017-12-27 NOTE — Progress Notes (Signed)
Subjective:  Patient ID: ERIE RADU, male    DOB: 07/12/1951  Age: 67 y.o. MRN: [id number]  CC: No chief complaint on file.   HPI RODEL GLASPY presents for a well exam C/o diarrhea off and on C/o stress w/mother-in-law living w/them  Outpatient Medications Prior to Visit  Medication Sig Dispense Refill  . acetaminophen-codeine (TYLENOL #3) 300-30 MG tablet Take 1-2 tablets by mouth every 6 (six) hours as needed for moderate pain. 60 tablet 2  . amLODipine-olmesartan (AZOR) 10-40 MG tablet Take 1 tablet by mouth every morning. Annual appt overdue must see provider for future refills 30 tablet 0  . aspirin EC 81 MG tablet Take 81 mg by mouth daily.    . blood glucose meter kit and supplies KIT Dispense based on patient and insurance preference. Use up to four times daily as directed. (FOR ICD-9 250.00, 250.01). 1 each 0  . Carboxymethylcellulose Sodium (REFRESH LIQUIGEL OP) Apply 2 drops to eye as needed (for dry eyes).     . colchicine 0.6 MG tablet Take 1 tablet (0.6 mg total) by mouth daily as needed. For gout 30 tablet 3  . fluticasone (FLONASE) 50 MCG/ACT nasal spray USE ONE SPRAY IN EACH NOSTRIL DAILY (Patient taking differently: Place 1 spray into both nostrils as needed. USE ONE SPRAY IN EACH NOSTRIL DAILY) 48 g 3  . furosemide (LASIX) 20 MG tablet Take 1-2 tablets (20-40 mg total) by mouth daily as needed for edema. 180 tablet 3  . hydrochlorothiazide (HYDRODIURIL) 25 MG tablet Take 25 mg by mouth daily.    . Iron-Vitamins (GERITOL COMPLETE PO) Take 1 tablet by mouth every morning. With Iron    . metoprolol tartrate (LOPRESSOR) 50 MG tablet Take 1 tablet (50 mg total) by mouth daily. Annual appt overdue must see provider for future refills 30 tablet 0  . Multiple Vitamin (MULTIVITAMIN) tablet Take 1 tablet by mouth daily.    . pioglitazone-metformin (ACTOPLUS MET) 15-850 MG tablet TAKE 1 TABLET BY MOUTH TWICE A DAY WITH A MEAL. 60 tablet 0  . pravastatin (PRAVACHOL) 40  MG tablet TAKE ONE TABLET BY MOUTH EACH EVENING 90 tablet 3  . tamsulosin (FLOMAX) 0.4 MG CAPS capsule Take 1 capsule (0.4 mg total) by mouth every morning. 90 capsule 3  . traMADol (ULTRAM) 50 MG tablet Take by mouth as needed.     Facility-Administered Medications Prior to Visit  Medication Dose Route Frequency Provider Last Rate Last Dose  . 0.9 %  sodium chloride infusion  500 mL Intravenous Once Irene Shipper, MD        ROS Review of Systems  Constitutional: Negative for appetite change, fatigue and unexpected weight change.  HENT: Negative for congestion, nosebleeds, sneezing, sore throat and trouble swallowing.   Eyes: Negative for itching and visual disturbance.  Respiratory: Negative for cough.   Cardiovascular: Negative for chest pain, palpitations and leg swelling.  Gastrointestinal: Negative for abdominal distention, blood in stool, diarrhea and nausea.  Genitourinary: Negative for frequency and hematuria.  Musculoskeletal: Negative for back pain, gait problem, joint swelling and neck pain.  Skin: Negative for rash.  Neurological: Negative for dizziness, tremors, speech difficulty and weakness.  Psychiatric/Behavioral: Negative for agitation, dysphoric mood and sleep disturbance. The patient is not nervous/anxious.     Objective:  BP 110/64 (BP Location: Left Arm, Patient Position: Sitting, Cuff Size: Large)   Pulse 64   Temp 98 F (36.7 C) (Oral)   Ht _0  (1.88 m)  Wt 261 lb (118.4 kg)   SpO2 100%   BMI 33.51 kg/m   BP Readings from Last 3 Encounters:  12/27/17 110/64  11/29/17 (!) 113/49  08/23/17 (!) 148/78    Wt Readings from Last 3 Encounters:  12/27/17 261 lb (118.4 kg)  11/29/17 270 lb (122.5 kg)  11/14/17 270 lb (122.5 kg)    Physical Exam  Constitutional: He is oriented to person, place, and time. He appears well-developed. No distress.  NAD  HENT:  Mouth/Throat: Oropharynx is clear and moist.  Eyes: Pupils are equal, round, and reactive to  light. Conjunctivae are normal.  Neck: Normal range of motion. No JVD present. No thyromegaly present.  Cardiovascular: Normal rate, regular rhythm, normal heart sounds and intact distal pulses. Exam reveals no gallop and no friction rub.  No murmur heard. Pulmonary/Chest: Effort normal and breath sounds normal. No respiratory distress. He has no wheezes. He has no rales. He exhibits no tenderness.  Abdominal: Soft. Bowel sounds are normal. He exhibits no distension and no mass. There is no tenderness. There is no rebound and no guarding.  Musculoskeletal: Normal range of motion. He exhibits no edema or tenderness.  Lymphadenopathy:    He has no cervical adenopathy.  Neurological: He is alert and oriented to person, place, and time. He has normal reflexes. No cranial nerve deficit. He exhibits normal muscle tone. He displays a negative Romberg sign. Coordination and gait normal.  Skin: Skin is warm and dry. No rash noted.  Psychiatric: He has a normal mood and affect. His behavior is normal. Judgment and thought content normal.  RUQ mass - hard Scar on abd Rectal - per Urology Obese   Lab Results  Component Value Date   WBC 4.8 10/29/2016   HGB 13.0 10/29/2016   HCT 40.1 10/29/2016   PLT 290.0 10/29/2016   GLUCOSE 106 (H) 10/29/2016   CHOL 185 07/13/2014   TRIG 58.0 07/13/2014   HDL 45.50 07/13/2014   LDLDIRECT 125.4 07/14/2008   LDLCALC 128 (H) 07/13/2014   ALT 16 (L) 06/22/2016   AST 19 06/22/2016   NA 141 10/29/2016   K 4.1 10/29/2016   CL 104 10/29/2016   CREATININE 0.98 10/29/2016   BUN 18 10/29/2016   CO2 29 10/29/2016   TSH 1.91 08/10/2015   PSA 0.07 (L) 10/29/2016   INR 1.14 06/23/2016   HGBA1C 6.6 (H) 10/29/2016    Dg Abd 2 Views  Result Date: 06/22/2016 CLINICAL DATA:  Nausea, lightheadedness EXAM: ABDOMEN - 2 VIEW COMPARISON:  CT abdomen 01/25/2015 FINDINGS: The bowel gas pattern is normal. There is no evidence of free air. No radio-opaque calculi or other  significant radiographic abnormality is seen. Prostatic radiation seeds are noted. IMPRESSION: Negative. Electronically Signed   By: Kathreen Devoid   On: 06/22/2016 15:44    Assessment & Plan:   There are no diagnoses linked to this encounter. I am having Damarko Stitely. Thull maintain his Iron-Vitamins (GERITOL COMPLETE PO), aspirin EC, Carboxymethylcellulose Sodium (REFRESH LIQUIGEL OP), furosemide, pravastatin, tamsulosin, acetaminophen-codeine, colchicine, fluticasone, blood glucose meter kit and supplies, traMADol, hydrochlorothiazide, multivitamin, amLODipine-olmesartan, pioglitazone-metformin, and metoprolol tartrate. We will continue to administer sodium chloride.  No orders of the defined types were placed in this encounter.    Follow-up: No follow-ups on file.  Walker Kehr, MD

## 2017-12-27 NOTE — Patient Instructions (Signed)
shingrix vaccineLactose Intolerance, Adult Lactose is the natural sugar found in milk and milk products, such as cheese and yogurt. Lactose is digested by lactase, an enzyme in your small intestine. Some people do not produce enough lactase to digest lactose. This is called lactose intolerance. Lactose intolerance is different from milk allergy, which is a more serious reaction to the protein in milk. What are the causes? Causes of lactose intolerance may include:  Normal aging. The ability to produce lactase may decline with age, causing lactose intolerance over time.  Being born without the ability to make lactase.  Digestive diseases such as gastroenteritis or inflammatory bowel disease.  Surgery or injuries to your small intestine.  Infection in your intestines.  Certain antibiotic medicines and cancer treatments.  What are the signs or symptoms? Lactose intolerance can cause uncomfortable symptoms. These are likely to occur within 30 minutes to 2 hours after eating or drinking foods containing lactose. Symptoms of lactose intolerance may include:  Nausea.  Diarrhea.  Abdominal cramps or pain.  Bloating.  Gas.  How is this diagnosed? There are several tests your health care provider can do to diagnose lactose intolerance. These tests include a hydrogen breath test and stool acidity test. How is this treated? No treatment can improve your body's ability to produce lactase. However, your symptoms can be controlled by limiting or avoiding milk products and other sources of lactose and adjusting your diet. Lactose-free milk is often tolerated. Lactose digestion may also be improved by adding lactase drops to regular milk or by taking lactase tablets when dairy products are consumed. Tolerance to lactose is individual. Some people may be able to eat or drink small amounts of products with lactose, while other may need to avoid lactose entirely. Talk to your health care provider about  what is best for you. Follow these instructions at home:  Limit or avoidfoods, beverages, and medicines containing lactose as directed by your health care provider.  Read food and medicine labels carefully to avoid products containing lactose, milk solids, casein, or whey.  If you eliminate dairy products, replace the protein, calcium, vitamin D, and other nutrients they contain through other foods. A registered dietitian or your health care provider can help you adjust your diet.  Choose a milk substitute that is fortified with calcium and vitamin D. Be aware that soy milk contains high quality protein, while milks made from nuts or grains contain very little protein.  Use lactase drops or tablets if directed by your health care provider. Contact a health care provider if: You have no relief from your symptoms after eliminating milk products and other sources of lactose. This information is not intended to replace advice given to you by your health care provider. Make sure you discuss any questions you have with your health care provider. Document Released: 08/20/2005 Document Revised: 01/26/2016 Document Reviewed: 11/20/2013 Elsevier Interactive Patient Education  2018 [employer].

## 2017-12-30 NOTE — Addendum Note (Signed)
Addended by: Karren Cobble on: 12/30/2017 01:31 PM   Modules accepted: Orders

## 2018-01-01 ENCOUNTER — Other Ambulatory Visit (INDEPENDENT_AMBULATORY_CARE_PROVIDER_SITE_OTHER): Payer: Medicare Other

## 2018-01-01 DIAGNOSIS — E118 Type 2 diabetes mellitus with unspecified complications: Secondary | ICD-10-CM | POA: Diagnosis not present

## 2018-01-01 DIAGNOSIS — Z0000 Encounter for general adult medical examination without abnormal findings: Secondary | ICD-10-CM | POA: Diagnosis not present

## 2018-01-01 DIAGNOSIS — D500 Iron deficiency anemia secondary to blood loss (chronic): Secondary | ICD-10-CM

## 2018-01-01 LAB — HEPATIC FUNCTION PANEL
ALK PHOS: 69 U/L (ref 39–117)
ALT: 10 U/L (ref 0–53)
AST: 14 U/L (ref 0–37)
Albumin: 3.8 g/dL (ref 3.5–5.2)
BILIRUBIN DIRECT: 0.2 mg/dL (ref 0.0–0.3)
Total Bilirubin: 0.8 mg/dL (ref 0.2–1.2)
Total Protein: 7.4 g/dL (ref 6.0–8.3)

## 2018-01-01 LAB — BASIC METABOLIC PANEL
BUN: 22 mg/dL (ref 6–23)
CALCIUM: 9.4 mg/dL (ref 8.4–10.5)
CHLORIDE: 101 meq/L (ref 96–112)
CO2: 29 meq/L (ref 19–32)
Creatinine, Ser: 1.28 mg/dL (ref 0.40–1.50)
GFR: 71.81 mL/min (ref >60.00)
Glucose, Bld: 101 mg/dL — ABNORMAL HIGH (ref 70–99)
Potassium: 3.5 mEq/L (ref 3.5–5.1)
Sodium: 141 mEq/L (ref 135–145)

## 2018-01-01 LAB — MICROALBUMIN / CREATININE URINE RATIO
CREATININE, U: 254.6 mg/dL
MICROALB UR: 1.3 mg/dL (ref 0.0–1.9)
MICROALB/CREAT RATIO: 0.5 mg/g (ref 0.0–30.0)

## 2018-01-01 LAB — URINALYSIS
Bilirubin Urine: NEGATIVE
Hgb urine dipstick: NEGATIVE
KETONES UR: NEGATIVE
Leukocytes, UA: NEGATIVE
Nitrite: NEGATIVE
SPECIFIC GRAVITY, URINE: 1.02 (ref 1.000–1.030)
Total Protein, Urine: NEGATIVE
URINE GLUCOSE: NEGATIVE
UROBILINOGEN UA: 1 (ref 0.0–1.0)
pH: 6 (ref 5.0–8.0)

## 2018-01-01 LAB — CBC WITH DIFFERENTIAL/PLATELET
BASOS ABS: 0 10*3/uL (ref 0.0–0.1)
Basophils Relative: 0.5 % (ref 0.0–3.0)
EOS ABS: 0.1 10*3/uL (ref 0.0–0.7)
Eosinophils Relative: 2.1 % (ref 0.0–5.0)
HEMATOCRIT: 40.8 % (ref 39.0–52.0)
HEMOGLOBIN: 13.7 g/dL (ref 13.0–17.0)
LYMPHS PCT: 21.6 % (ref 12.0–46.0)
Lymphs Abs: 1.2 10*3/uL (ref 0.7–4.0)
MCHC: 33.6 g/dL (ref 30.0–36.0)
MCV: 89.5 fl (ref 78.0–100.0)
Monocytes Absolute: 0.4 10*3/uL (ref 0.1–1.0)
Monocytes Relative: 7.3 % (ref 3.0–12.0)
Neutro Abs: 3.9 10*3/uL (ref 1.4–7.7)
Neutrophils Relative %: 68.5 % (ref 43.0–77.0)
PLATELETS: 302 10*3/uL (ref 150.0–400.0)
RBC: 4.56 Mil/uL (ref 4.22–5.81)
RDW: 14.1 % (ref 11.5–15.5)
WBC: 5.7 10*3/uL (ref 4.0–10.5)

## 2018-01-01 LAB — VITAMIN D 25 HYDROXY (VIT D DEFICIENCY, FRACTURES): VITD: 34.6 ng/mL (ref 30.00–100.00)

## 2018-01-01 LAB — LIPID PANEL
CHOL/HDL RATIO: 3
Cholesterol: 124 mg/dL (ref 0–200)
HDL: 41.4 mg/dL (ref >39.00)
LDL CALC: 67 mg/dL (ref 0–99)
NONHDL: 82.6
Triglycerides: 77 mg/dL (ref 0.0–149.0)
VLDL: 15.4 mg/dL (ref 0.0–40.0)

## 2018-01-01 LAB — VITAMIN B12: Vitamin B-12: 1070 pg/mL — ABNORMAL HIGH (ref 211–911)

## 2018-01-01 LAB — HEMOGLOBIN A1C: HEMOGLOBIN A1C: 6.6 % — AB (ref 4.6–6.5)

## 2018-01-01 LAB — TSH: TSH: 2.84 u[IU]/mL (ref 0.35–4.50)

## 2018-01-02 ENCOUNTER — Telehealth: Payer: Self-pay | Admitting: Internal Medicine

## 2018-01-02 LAB — IRON,TIBC AND FERRITIN PANEL
%SAT: 20 % (calc) (ref 15–60)
Ferritin: 48 ng/mL (ref 20–380)
IRON: 60 ug/dL (ref 50–180)
TIBC: 305 mcg/dL (calc) (ref 250–425)

## 2018-01-02 NOTE — Telephone Encounter (Signed)
Copied from [place] [id number]. Topic: Quick Communication - See Telephone Encounter >> Jan 02, 2018  9:19 AM Synthia Innocent wrote: CRM for notification. See Telephone encounter for: 01/02/18. Patient spouse calling, indomethacin (INDOCIN) 50 MG capsule need insurance PA

## 2018-01-07 ENCOUNTER — Telehealth: Payer: Self-pay

## 2018-01-07 NOTE — Telephone Encounter (Signed)
Pt calling with status request for pa  Please call [phone]

## 2018-01-07 NOTE — Telephone Encounter (Signed)
PA started on CoverMyMeds KEY: [id number]

## 2018-01-09 NOTE — Telephone Encounter (Signed)
PA denied, pt notified.

## 2018-01-14 ENCOUNTER — Ambulatory Visit
Admission: RE | Admit: 2018-01-14 | Discharge: 2018-01-14 | Disposition: A | Discharge status: Home / Self Care | Payer: Medicare Other | Source: Ambulatory Visit | Attending: Internal Medicine | Admitting: Internal Medicine

## 2018-01-14 DIAGNOSIS — C61 Malignant neoplasm of prostate: Secondary | ICD-10-CM

## 2018-01-14 DIAGNOSIS — R1901 Right upper quadrant abdominal swelling, mass and lump: Secondary | ICD-10-CM

## 2018-02-27 ENCOUNTER — Ambulatory Visit (INDEPENDENT_AMBULATORY_CARE_PROVIDER_SITE_OTHER): Payer: Medicare Other

## 2018-02-27 DIAGNOSIS — Z299 Encounter for prophylactic measures, unspecified: Secondary | ICD-10-CM

## 2018-03-19 ENCOUNTER — Other Ambulatory Visit: Payer: Self-pay | Admitting: Internal Medicine

## 2018-05-06 ENCOUNTER — Encounter: Payer: Self-pay | Admitting: Family

## 2018-05-06 ENCOUNTER — Ambulatory Visit (INDEPENDENT_AMBULATORY_CARE_PROVIDER_SITE_OTHER): Payer: Medicare Other | Admitting: Family

## 2018-05-06 ENCOUNTER — Ambulatory Visit: Payer: Medicare Other

## 2018-05-06 VITALS — BP 132/80 | HR 62 | Temp 97.7°F | Ht 74.0 in | Wt 261.2 lb

## 2018-05-06 DIAGNOSIS — H5711 Ocular pain, right eye: Secondary | ICD-10-CM

## 2018-05-06 DIAGNOSIS — Z23 Encounter for immunization: Secondary | ICD-10-CM

## 2018-05-06 NOTE — Progress Notes (Signed)
[name] [name] is a 67 y.o. male with the following history as recorded in EpicCare:  Patient Active Problem List   Diagnosis Date Noted  . Melena 06/22/2016  . Fatigue 08/10/2015  . Erectile dysfunction 04/25/2015  . Anemia due to chronic blood loss 12/21/2014  . GI bleed 12/21/2014  . Chest pain at rest 09/30/2014  . Edema 07/13/2014  . Allergic rhinitis 03/17/2014  . PAF (paroxysmal atrial fibrillation) ([place]) 05/25/2013  . Cardiomegaly 12/12/2012  . Liver mass 12/12/2012  . Prostate cancer ([place]) 12/11/2012  . Hepatic cyst 12/11/2012  . Hematuria, gross 09/05/2012  . Knee osteoarthritis 09/05/2012  . OSA on CPAP 09/05/2012  . Snoring 09/07/2011  . Paresthesia of left arm 07/06/2011  . Gout attack 03/13/2011  . Well adult exam 03/03/2011  . Arthritis of knee 03/03/2011  . MENTAL CONFUSION 03/09/2010  . GAIT DISTURBANCE 03/09/2010  . HEAT STROKE 03/09/2010  . FOOT PAIN 11/08/2009  . CHEST PAIN UNSPECIFIED 08/19/2009  . COPD (chronic obstructive pulmonary disease) ([place]) 07/08/2009  . TOBACCO USE, QUIT 07/08/2009  . RECTAL BLEEDING 12/07/2008  . PERSONAL HISTORY OF COLONIC POLYPS 12/07/2008  . Dyslipidemia 03/02/2008  . HEMATOCHEZIA 03/02/2008  . COUGH 03/02/2008  . HYPOKALEMIA 02/03/2008  . Acute sinusitis 02/03/2008  . SYNCOPE 02/03/2008  . DM (diabetes mellitus), type 2 with complications ([place]) 60/63/0160  . Pain in joint, shoulder region 11/27/2007  . ERECTILE DYSFUNCTION 10/30/2007  . Essential hypertension 10/30/2007  . OSTEOARTHRITIS 10/30/2007  . LOW BACK PAIN 10/30/2007    Current Outpatient Medications  Medication Sig Dispense Refill  . acetaminophen-codeine (TYLENOL #3) 300-30 MG tablet Take 1-2 tablets by mouth every 6 (six) hours as needed for moderate pain. 60 tablet 2  . amLODipine-olmesartan (AZOR) 10-40 MG tablet Take 1 tablet by mouth every morning. Annual appt overdue must see provider for future refills 30 tablet 0  . aspirin EC 81 MG tablet  Take 81 mg by mouth daily.    . blood glucose meter kit and supplies KIT Dispense based on patient and insurance preference. Use up to four times daily as directed. (FOR ICD-9 250.00, 250.01). 1 each 0  . Carboxymethylcellulose Sodium (REFRESH LIQUIGEL OP) Apply 2 drops to eye as needed (for dry eyes).     . colchicine 0.6 MG tablet Take 1 tablet (0.6 mg total) by mouth daily as needed. For gout 30 tablet 3  . diclofenac sodium (VOLTAREN) 1 % GEL Apply topically.    . fluticasone (FLONASE) 50 MCG/ACT nasal spray USE ONE SPRAY IN EACH NOSTRIL DAILY (Patient taking differently: Place 1 spray into both nostrils as needed. USE ONE SPRAY IN EACH NOSTRIL DAILY) 48 g 3  . furosemide (LASIX) 20 MG tablet Take 1-2 tablets (20-40 mg total) by mouth daily as needed for edema. 180 tablet 3  . hydrochlorothiazide (HYDRODIURIL) 25 MG tablet Take 25 mg by mouth daily.    . indomethacin (INDOCIN) 50 MG capsule Take 1 capsule (50 mg total) by mouth 3 (three) times daily as needed for moderate pain (gout). 60 capsule 3  . Iron-Vitamins (GERITOL COMPLETE PO) Take 1 tablet by mouth every morning. With Iron    . metoprolol tartrate (LOPRESSOR) 50 MG tablet Take 1 tablet (50 mg total) by mouth daily. Follow-up appt due in Oct must see provider for future refills 90 tablet 0  . montelukast (SINGULAIR) 10 MG tablet Take by mouth.    . Multiple Vitamin (MULTIVITAMIN) tablet Take 1 tablet by mouth daily.    Marland Kitchen  pioglitazone-metformin (ACTOPLUS MET) 15-850 MG tablet TAKE 1 TABLET BY MOUTH TWICE A DAY WITH A MEAL. 60 tablet 0  . pravastatin (PRAVACHOL) 40 MG tablet TAKE ONE TABLET BY MOUTH EACH EVENING 90 tablet 3  . tamsulosin (FLOMAX) 0.4 MG CAPS capsule Take 1 capsule (0.4 mg total) by mouth every morning. 90 capsule 3   Current Facility-Administered Medications  Medication Dose Route Frequency Provider Last Rate Last Dose  . 0.9 %  sodium chloride infusion  500 mL Intravenous Once Irene Shipper, MD        Allergies:  Benazepril hcl and Sildenafil  Past Medical History:  Diagnosis Date  . Allergy   . Bilateral renal cysts    benign  . COPD (chronic obstructive pulmonary disease) ([place])     PFT 2010 due to previous 20y h/o smoking  . DDD (degenerative disc disease), lumbar   . Diabetes mellitus type II   . ED (erectile dysfunction) of organic origin   . First degree heart block   . History of colon polyps   . History of prostate cancer current PSA per pt 0.13   dx March 2014---  s/p radiactive prostate seed implants and intraop radiotherapy in [place], [place]  . Hyperlipidemia   . Hypertension   . Liver mass 06/2013   OVER HALF OF LIVER REMOVED/ BENIGN  . OSA on CPAP   . Osteoarthritis   . PAF (paroxysmal atrial fibrillation) [facility])    cardiologist--  dr hochrein  (episode prior to liver surgery at [facility] 2014 w/ post-op RVR)  . Prostate cancer ([place]) 11-11-2012   s/p chemo, radiation   . Rectal cancer ([place])    01-25-2015----per pathology --  complete rectal polypectomy-- showed  invasive carcinoma rising in tubular adnemoa high grade hyperplasia--  per dr Ardis Hughs per MRI  no other area seen -- will have colonoscopy done in 3 months  . Sleep apnea    wears cpap  . Ulcer    STOMACH ULCER  . Wears glasses     Past Surgical History:  Procedure Laterality Date  .  RIGHT LOBE HEPATECTOMY  06-12-2013    [facility]   benign mass  . CARDIOVASCULAR STRESS TEST  10-01-2014   dr hochrein   normal perfusion study, no ischemia,  normal LV function and wall motion, ef 70%  . COLONOSCOPY    . ESOPHAGOGASTRODUODENOSCOPY N/A 06/23/2016   Procedure: ESOPHAGOGASTRODUODENOSCOPY (EGD);  Surgeon: Manus Gunning, MD;  Location: [facility];  Service: Gastroenterology;  Laterality: N/A;  . EUS N/A 02/10/2015   Procedure: LOWER ENDOSCOPIC ULTRASOUND (EUS);  Surgeon: Milus Banister, MD;  Location: [facility];  Service: Endoscopy;  Laterality: N/A;  . KNEE ARTHROSCOPY Bilateral 1990's  . LUMBAR FUSION  2001   . PENILE PROSTHESIS IMPLANT N/A 04/25/2015   Procedure: PENILE PROSTHESIS THREE PIECE INFLATABLE( COLOPLAST) SCROTAL APPROACH;  Surgeon: Kathie Rhodes, MD;  Location: [facility];  Service: Urology;  Laterality: N/A;  . POLYPECTOMY    . RADIOACTIVE PROSTATE SEED IMPLANTS  May 2014  in [place], [place] Right 02/21/2016  . TOTAL KNEE ARTHROPLASTY Left 10- 2013  . TRANSTHORACIC ECHOCARDIOGRAM  12-26-2012   mild focal basal septal hypertrophy,  grade II diastolic dysfunction,  ef 55-60%,  mild LAE,  trivial MR and TR  . UPPER GASTROINTESTINAL ENDOSCOPY    . VASECTOMY  1980's w/ gen. anes.    Family History  Problem Relation Age of Onset  . Diabetes Mother   .  Heart disease Mother        Pacemaker  . Pancreatic cancer Father   . Stomach cancer Father   . Stomach cancer Sister   . Pancreatic cancer Sister   . Hypertension Other   . Colon polyps Neg Hx   . Rectal cancer Neg Hx   . Esophageal cancer Neg Hx   . Colon cancer Neg Hx     Social History   Tobacco Use  . Smoking status: Former Smoker    Packs/day: 2.00    Years: 15.00    Pack years: 30.00    Types: Cigarettes    Last attempt to quit: 09/04/1999    Years since quitting: 18.6  . Smokeless tobacco: Never Used  Substance Use Topics  . Alcohol use: Yes    Alcohol/week: 0.0 standard drinks    Comment: Occasional    Subjective:  Patient came to the office for his shingles vaccine today; while meeting with the nurse, concern noted about appearance of patient's right eye; he notes that within the past 24 hours, developed pain/ drainage and entire red/ bloody appearance; denies any vision changes however; he has not contacted his eye doctor however;   Objective:  Vitals:   05/06/18 0923  BP: 132/80  Pulse: 62  Temp: 97.7 F (36.5 C)  TempSrc: Oral  SpO2: 97%  Weight: 261 lb 3.2 oz (118.5 kg)  Height: '6\' 2"'  (1.88 m)    General: Well developed, well nourished, in no acute distress   Skin : Warm and dry.  Head: Normocephalic and atraumatic  Eyes: Right eye is obviously red/ draining/ swollen;  Lungs: Respirations unlabored; Neurologic: Alert and oriented; speech intact; face symmetrical; moves all extremities well; CNII-XII intact without focal deficit   Assessment:  1. Pain of right eye   2. Need for shingles vaccine     Plan:  1. Patient is instructed go to to his eye doctor immediately; he agrees; 2. Shingrix was given prior to me seeing patient today;   No follow-ups on file.  Orders Placed This Encounter  Procedures  . Varicella-zoster vaccine IM (Shingrix)    Requested Prescriptions    No prescriptions requested or ordered in this encounter

## 2018-08-12 ENCOUNTER — Other Ambulatory Visit: Payer: Self-pay | Admitting: Internal Medicine

## 2018-08-22 NOTE — Progress Notes (Addendum)
Subjective:   [name] [name] is a 67 y.o. male who presents for Medicare Annual/Subsequent preventive examination.  Review of Systems:  No ROS.  Medicare Wellness Visit. Additional risk factors are reflected in the social history.  Cardiac Risk Factors include: advanced age (>63mn, >>7women);diabetes mellitus;dyslipidemia;male gender;hypertension Sleep patterns: feels rested on waking, gets up 2 times nightly to void and sleeps 7-8 hours nightly.    Home Safety/Smoke Alarms: Feels safe in home. Smoke alarms in place.  Living environment; residence and Firearm Safety: 1-story house/ trailer, no firearms. Lives with wife, no needs for DME, good support system Seat Belt Safety/Bike Helmet: Wears seat belt.   PSA-  Lab Results  Component Value Date   PSA 0.07 (L) 10/29/2016   PSA 0.50 03/17/2014   PSA 0.98 07/20/2013       Objective:    Vitals: BP (!) 142/76   Pulse 61   Resp 17   Ht '6\' 2"'  (1.88 m)   Wt 271 lb (122.9 kg)   SpO2 99%   BMI 34.79 kg/m   Body mass index is 34.79 kg/m.  Advanced Directives 08/25/2018 08/23/2017 10/03/2016 08/02/2016 06/22/2016 02/24/2016 02/01/2016  Does Patient Have a Medical Advance Directive? Yes Yes Yes Yes Yes Yes Yes  Type of AParamedicof [place] will [place] will [place]Living will HOhi[place]Living will Living will HMount HermonLiving will Living will;Healthcare Power of Attorney  Does patient want to make changes to medical advance directive? - - - - No - Patient declined No - Patient declined -  Copy of HScott AFBin Chart? No - copy requested No - copy requested - - No - copy requested No - copy requested -  Would patient like information on creating a medical advance directive? - - - - - - -    Tobacco Social History   Tobacco Use  Smoking Status Former Smoker  . Packs/day: 2.00  . Years:  15.00  . Pack years: 30.00  . Types: Cigarettes  . Last attempt to quit: 09/04/1999  . Years since quitting: 18.9  Smokeless Tobacco Never Used     Counseling given: Not Answered  Past Medical History:  Diagnosis Date  . Allergy   . Bilateral renal cysts    benign  . COPD (chronic obstructive pulmonary disease) ([place]     PFT 2010 due to previous 20y h/o smoking  . DDD (degenerative disc disease), lumbar   . Diabetes mellitus type II   . ED (erectile dysfunction) of organic origin   . First degree heart block   . History of colon polyps   . History of prostate cancer current PSA per pt 0.13   dx March 2014---  [place] radi[place] prostate seed [place] . Hyperlipidemia   . Hypertension   . Liver mass 06/2013   OVER HALF OF LIVER REMOVED/ BENIGN  . OSA on CPAP   . Osteoarthritis   . PAF (paroxysmal atrial fibrillation) ([facility]    cardiologist--  dr hochrein  ([facility] w[facility] post[facility]op [facility])  . [place]   s/p chemo, radiation   . [place]al cancer ([place]    01-25-2015----per pathology --  complete rectal polypectomy-- showed  invasive carcinoma rising in tubular adnemoa high grade hyperplasia--  per dr jArdis Hughsper MRI  no other area seen -- will have colonoscopy done  in 3 months  . Sleep apnea    wears cpap  . Ulcer    STOMACH ULCER  . Wears glasses    Past Surgical History:  Procedure Laterality Date  .  RIGHT LOBE HEPATECTOMY  06-12-2013    [facility]   benign mass  . CARDIOVASCULAR STRESS TEST  10-01-2014   dr hochrein   normal perfusion study, no ischemia,  normal LV function and wall motion, ef 70%  . COLONOSCOPY    . ESOPHAGOGASTRODUODENOSCOPY N/A 06/23/2016   Procedure: ESOPHAGOGASTRODUODENOSCOPY (EGD);  Surgeon: Manus Gunning, MD;  Location: [facility];  Service: Gastroenterology;  Laterality: N/A;  . EUS N/A 02/10/2015   Procedure: LOWER ENDOSCOPIC ULTRASOUND (EUS);   Surgeon: Milus Banister, MD;  [facility];  Service: Endoscopy;  Laterality: N/A;  . KNEE ARTHROSCOPY Bilateral 1990's  . LUMBAR FUSION  2001  . PENILE PROSTHESIS IMPLANT N/A 04/25/2015   Procedure: PENILE PROSTHESIS THREE PIECE INFLATABLE( COLOPLAST) SCROTAL APPROACH;  Surgeon: Kathie Rhodes, MD;  Location: [facility];  Service: Urology;  Laterality: N/A;  . POLYPECTOMY    . RADIOACTIVE PROSTATE SEED IMPLANTS  May 2014  in [place] Right 02/21/2016  . TOTAL KNEE ARTHROPLASTY Left 10- 2013  . TRANSTHORACIC ECHOCARDIOGRAM  12-26-2012   mild focal basal septal hypertrophy,  grade II diastolic dysfunction,  ef 55-60%,  mild LAE,  trivial MR and TR  . UPPER GASTROINTESTINAL ENDOSCOPY    . VASECTOMY  1980's w/ gen. anes.   Family History  Problem Relation Age of Onset  . Diabetes Mother   . Heart disease Mother        Pacemaker  . Pancreatic cancer Father   . Stomach cancer Father   . Stomach cancer Sister   . Pancreatic cancer Sister   . Hypertension Other   . Colon polyps Neg Hx   . Rectal cancer Neg Hx   . Esophageal cancer Neg Hx   . Colon cancer Neg Hx    Social History   Socioeconomic History  . Marital status: Married    Spouse name: Not on file  . Number of children: 2  . Years of education: Not on file  . Highest education level: Not on file  Occupational History  . Occupation: [employer]    Comment: Retired  . Occupation: teaching  Social Needs  . Financial resource strain: Not hard at all  . Food insecurity:    Worry: Never true    Inability: Never true  . Transportation needs:    Medical: No    Non-medical: No  Tobacco Use  . Smoking status: Former Smoker    Packs/day: 2.00    Years: 15.00    Pack years: 30.00    Types: Cigarettes    Last attempt to quit: 09/04/1999    Years since quitting: 18.9  . Smokeless tobacco: Never Used  Substance and Sexual Activity  . Alcohol use: Yes    Alcohol/week: 0.0  standard drinks    Comment: Occasional  . Drug use: No  . Sexual activity: Not Currently  Lifestyle  . Physical activity:    Days per week: 4 days    Minutes per session: 50 min  . Stress: Not at all  Relationships  . Social connections:    Talks on phone: More than three times a week    Gets together: More than three times a week    Attends religious service: More than 4 times  per year    Active member of club or organization: Not on file    Attends meetings of clubs or organizations: More than 4 times per year    Relationship status: Married  Other Topics Concern  . Not on file  Social History Narrative   Regular exercise- yes, walking    Outpatient Encounter Medications as of 08/25/2018  Medication Sig  . acetaminophen-codeine (TYLENOL #3) 300-30 MG tablet Take 1-2 tablets by mouth every 6 (six) hours as needed for moderate pain.  Marland Kitchen amLODipine-olmesartan (AZOR) 10-40 MG tablet Take 1 tablet by mouth every morning. Patient needs office visit before refills will be given  . aspirin EC 81 MG tablet Take 81 mg by mouth daily.  . blood glucose meter kit and supplies KIT Dispense based on patient and insurance preference. Use up to four times daily as directed. (FOR ICD-9 250.00, 250.01).  . Carboxymethylcellulose Sodium (REFRESH LIQUIGEL OP) Apply 2 drops to eye as needed (for dry eyes).   . colchicine 0.6 MG tablet Take 1 tablet (0.6 mg total) by mouth daily as needed. For gout  . diclofenac sodium (VOLTAREN) 1 % GEL Apply 2 g topically 2 (two) times daily.  . fluticasone (FLONASE) 50 MCG/ACT nasal spray USE ONE SPRAY IN EACH NOSTRIL DAILY  . furosemide (LASIX) 20 MG tablet Take 1-2 tablets (20-40 mg total) by mouth daily as needed for edema.  . hydrochlorothiazide (HYDRODIURIL) 25 MG tablet Take 1 tablet (25 mg total) by mouth daily.  . indomethacin (INDOCIN) 50 MG capsule Take 1 capsule (50 mg total) by mouth 3 (three) times daily as needed for moderate pain (gout).  .  Iron-Vitamins (GERITOL COMPLETE PO) Take 1 tablet by mouth every morning. With Iron  . metoprolol tartrate (LOPRESSOR) 50 MG tablet Take 1 tablet (50 mg total) by mouth daily. Follow-up appt due in Oct must see provider for future refills  . montelukast (SINGULAIR) 10 MG tablet Take by mouth.  . Multiple Vitamin (MULTIVITAMIN) tablet Take 1 tablet by mouth daily.  . pioglitazone-metformin (ACTOPLUS MET) 15-850 MG tablet TAKE 1 TABLET BY MOUTH TWICE A DAY WITH A MEAL.  . pravastatin (PRAVACHOL) 40 MG tablet TAKE ONE TABLET BY MOUTH EACH EVENING  . tamsulosin (FLOMAX) 0.4 MG CAPS capsule Take 1 capsule (0.4 mg total) by mouth every morning.  . [DISCONTINUED] diclofenac sodium (VOLTAREN) 1 % GEL Apply topically.  . [DISCONTINUED] fluticasone (FLONASE) 50 MCG/ACT nasal spray USE ONE SPRAY IN EACH NOSTRIL DAILY (Patient taking differently: Place 1 spray into both nostrils as needed. USE ONE SPRAY IN EACH NOSTRIL DAILY)  . [DISCONTINUED] hydrochlorothiazide (HYDRODIURIL) 25 MG tablet Take 25 mg by mouth daily.   Facility-Administered Encounter Medications as of 08/25/2018  Medication  . 0.9 %  sodium chloride infusion    Activities of Daily Living In your present state of health, do you have any difficulty performing the following activities: 08/25/2018  Hearing? N  Vision? N  Difficulty concentrating or making decisions? N  Walking or climbing stairs? N  Dressing or bathing? N  Doing errands, shopping? N  Preparing Food and eating ? N  Using the Toilet? N  In the past six months, have you accidently leaked urine? N  Do you have problems with loss of bowel control? N  Managing your Medications? N  Managing your Finances? N  Housekeeping or managing your Housekeeping? N  Some recent data might be hidden    Patient Care Team: Plotnikov, Evie Lacks, MD as PCP - General  Irene Shipper, MD as Attending Physician (Gastroenterology) Alexis Frock, MD as Attending Physician (Urology)     Assessment:   This is a routine wellness examination for [name] [name]. Physical assessment deferred to PCP.   Exercise Activities and Dietary recommendations Current Exercise Habits: Home exercise routine, Type of exercise: walking;strength training/weights;calisthenics(elliptical), Intensity: Mild, Exercise limited by: orthopedic condition(s)  Diet (meal preparation, eat out, water intake, caffeinated beverages, dairy products, fruits and vegetables): in general, a "healthy" diet  , well balanced. eats a variety of fruits and vegetables daily, limits salt, fat/cholesterol, sugar,carbohydrates,caffeine, drinks 6-8 glasses of water daily.  Reviewed heart healthy and diabetic diet  Goals    . Patient Stated     Maintain current health status, continue to exercise eat healthy, Worship God, enjoy life and family.    . Patient Stated     Lose more weight by working out downstairs, keep moving both physically and socially.       Fall Risk Fall Risk  08/25/2018 08/23/2017 11/11/2014  Falls in the past year? 0 Yes No  Number falls in past yr: - 1 -  Injury with Fall? - No -    Depression Screen PHQ 2/9 Scores 08/25/2018 05/06/2018 08/23/2017 11/11/2014  PHQ - 2 Score 0 0 1 0  PHQ- 9 Score - 0 1 -    Cognitive Function MMSE - Mini Mental State Exam 08/23/2017  Orientation to time 5  Orientation to Place 5  Registration 3  Attention/ Calculation 5  Recall 2  Language- name 2 objects 2  Language- repeat 1  Language- follow 3 step command 3  Language- read & follow direction 1  Write a sentence 1  Copy design 1  Total score 29       Ad8 score reviewed for issues:  Issues making decisions: no  Less interest in hobbies / activities: no  Repeats questions, stories (family complaining): no  Trouble using ordinary gadgets (microwave, computer, phone):no  Forgets the month or year: no  Mismanaging finances: no  Remembering appts: no  Daily problems with thinking and/or  memory: no Ad8 score is= 0  Immunization History  Administered Date(s) Administered  . Influenza Split 06/04/2011  . Influenza Whole 05/04/2012  . Influenza,inj,Quad PF,6+ Mos 05/25/2013  . Influenza-Unspecified 05/18/2012, 05/05/2015, 06/20/2016, 05/04/2017  . Pneumococcal Conjugate-13 05/19/2011, 10/29/2016  . Pneumococcal Polysaccharide-23 11/08/2009, 06/23/2016  . Tdap 12/27/2017  . Zoster Recombinat (Shingrix) 02/27/2018, 05/06/2018   Screening Tests Health Maintenance  Topic Date Due  . Hepatitis C Screening  July 04, 1951  . OPHTHALMOLOGY EXAM  12/02/2017  . INFLUENZA VACCINE  04/03/2018  . HEMOGLOBIN A1C  07/04/2018  . FOOT EXAM  08/23/2018  . COLONOSCOPY  11/30/2018  . TETANUS/TDAP  12/28/2027  . PNA vac Low Risk Adult  Completed      Plan:     Continue doing brain stimulating activities (puzzles, reading, adult coloring books, staying active) to keep memory sharp.   Continue to eat heart healthy diet (full of fruits, vegetables, whole grains, lean protein, water--limit salt, fat, and sugar intake) and increase physical activity as tolerated.  I have personally reviewed and noted the following in the patient's chart:   . Medical and social history . Use of alcohol, tobacco or illicit drugs  . Current medications and supplements . Functional ability and status . Nutritional status . Physical activity . Advanced directives . List of other physicians . Vitals . Screenings to include cognitive, depression, and falls . Referrals and appointments  In addition,  I have reviewed and discussed with patient certain preventive protocols, quality metrics, and best practice recommendations. A written personalized care plan for preventive services as well as general preventive health recommendations were provided to patient.     Michiel Cowboy, RN  08/25/2018  Medical screening examination/treatment/procedure(s) were performed by non-physician practitioner and as supervising  physician I was immediately available for consultation/collaboration. I agree with above. Lew Dawes, MD

## 2018-08-25 ENCOUNTER — Telehealth: Payer: Self-pay | Admitting: *Deleted

## 2018-08-25 ENCOUNTER — Ambulatory Visit (INDEPENDENT_AMBULATORY_CARE_PROVIDER_SITE_OTHER): Payer: Medicare Other | Admitting: *Deleted

## 2018-08-25 VITALS — BP 142/76 | HR 61 | Resp 17 | Ht 74.0 in | Wt 271.0 lb

## 2018-08-25 DIAGNOSIS — Z0000 Encounter for general adult medical examination without abnormal findings: Secondary | ICD-10-CM | POA: Diagnosis not present

## 2018-08-25 MED ORDER — FLUTICASONE PROPIONATE 50 MCG/ACT NA SUSP: NASAL | 3 refills | Status: DC

## 2018-08-25 MED ORDER — HYDROCHLOROTHIAZIDE 25 MG PO TABS: 25.0000 mg | ORAL_TABLET | Freq: Every day | ORAL | 3 refills | Status: DC

## 2018-08-25 MED ORDER — DICLOFENAC SODIUM 1 % TD GEL: 2.0000 g | Freq: Two times a day (BID) | TRANSDERMAL | 1 refills | Status: DC

## 2018-08-25 NOTE — Telephone Encounter (Signed)
During AWV, patient stated that he was having shoulder pain and asked to have prescription Tylenol #3 refilled. An appointment to evaluate was scheduled for 09/25/18.

## 2018-08-25 NOTE — Patient Instructions (Addendum)
Continue doing brain stimulating activities (puzzles, reading, adult coloring books, staying active) to keep memory sharp.   Continue to eat heart healthy diet (full of fruits, vegetables, whole grains, lean protein, water--limit salt, fat, and sugar intake) and increase physical activity as tolerated.   [name] [name] , Thank you for taking time to come for your Medicare Wellness Visit. I appreciate your ongoing commitment to your health goals. Please review the following plan we discussed and let me know if I can assist you in the future.   These are the goals we discussed: Goals    . Patient Stated     Maintain current health status, continue to exercise eat healthy, Worship God, enjoy life and family.    . Patient Stated     Lose more weight by working out downstairs, keep moving both physically and socially.       This is a list of the screening recommended for you and due dates:  Health Maintenance  Topic Date Due  .  Hepatitis C: One time screening is recommended by Center for Disease Control  (CDC) for  adults born from [birth year] through 1966.   06-Jul-1951  . Eye exam for diabetics  12/02/2017  . Flu Shot  04/03/2018  . Hemoglobin A1C  07/04/2018  . Complete foot exam   08/23/2018  . Colon Cancer Screening  11/30/2018  . Tetanus Vaccine  12/28/2027  . Pneumonia vaccines  Completed     Health Maintenance, Male A healthy lifestyle and preventive care is important for your health and wellness. Ask your health care provider about what schedule of regular examinations is right for you. What should I know about weight and diet? Eat a Healthy Diet  Eat plenty of vegetables, fruits, whole grains, low-fat dairy products, and lean protein.  Do not eat a lot of foods high in solid fats, added sugars, or salt.  Maintain a Healthy Weight Regular exercise can help you achieve or maintain a healthy weight. You should:  Do at least 150 minutes of exercise each week. The exercise should  increase your heart rate and make you sweat (moderate-intensity exercise).  Do strength-training exercises at least twice a week. Watch Your Levels of Cholesterol and Blood Lipids  Have your blood tested for lipids and cholesterol every 5 years starting at 68 years of age. If you are at high risk for heart disease, you should start having your blood tested when you are 68 years old. You may need to have your cholesterol levels checked more often if: ? Your lipid or cholesterol levels are high. ? You are older than 68 years of age. ? You are at high risk for heart disease. What should I know about cancer screening? Many types of cancers can be detected early and may often be prevented. Lung Cancer  You should be screened every year for lung cancer if: ? You are a current smoker who has smoked for at least 30 years. ? You are a former smoker who has quit within the past 15 years.  Talk to your health care provider about your screening options, when you should start screening, and how often you should be screened. Colorectal Cancer  Routine colorectal cancer screening usually begins at 68 years of age and should be repeated every 5-10 years until you are 68 years old. You may need to be screened more often if early forms of precancerous polyps or small growths are found. Your health care provider may recommend screening at an  earlier age if you have risk factors for colon cancer.  Your health care provider may recommend using home test kits to check for hidden blood in the stool.  A small camera at the end of a tube can be used to examine your colon (sigmoidoscopy or colonoscopy). This checks for the earliest forms of colorectal cancer. Prostate and Testicular Cancer  Depending on your age and overall health, your health care provider may do certain tests to screen for prostate and testicular cancer.  Talk to your health care provider about any symptoms or concerns you have about testicular  or prostate cancer. Skin Cancer  Check your skin from head to toe regularly.  Tell your health care provider about any new moles or changes in moles, especially if: ? There is a change in a mole's size, shape, or color. ? You have a mole that is larger than a pencil eraser.  Always use sunscreen. Apply sunscreen liberally and repeat throughout the day.  Protect yourself by wearing long sleeves, pants, a wide-brimmed hat, and sunglasses when outside. What should I know about heart disease, diabetes, and high blood pressure?  If you are 30-7 years of age, have your blood pressure checked every 3-5 years. If you are 51 years of age or older, have your blood pressure checked every year. You should have your blood pressure measured twice-once when you are at a hospital or clinic, and once when you are not at a hospital or clinic. Record the average of the two measurements. To check your blood pressure when you are not at a hospital or clinic, you can use: ? An automated blood pressure machine at a pharmacy. ? A home blood pressure monitor.  Talk to your health care provider about your target blood pressure.  If you are between 57-34 years old, ask your health care provider if you should take aspirin to prevent heart disease.  Have regular diabetes screenings by checking your fasting blood sugar level. ? If you are at a normal weight and have a low risk for diabetes, have this test once every three years after the age of 75. ? If you are overweight and have a high risk for diabetes, consider being tested at a younger age or more often.  A one-time screening for abdominal aortic aneurysm (AAA) by ultrasound is recommended for men aged 60-75 years who are current or former smokers. What should I know about preventing infection? Hepatitis B If you have a higher risk for hepatitis B, you should be screened for this virus. Talk with your health care provider to find out if you are at risk for  hepatitis B infection. Hepatitis C Blood testing is recommended for:  Everyone born from [birth year] through 1966.  Anyone with known risk factors for hepatitis C. Sexually Transmitted Diseases (STDs)  You should be screened each year for STDs including gonorrhea and chlamydia if: ? You are sexually active and are younger than 68 years of age. ? You are older than 68 years of age and your health care provider tells you that you are at risk for this type of infection. ? Your sexual activity has changed since you were last screened and you are at an increased risk for chlamydia or gonorrhea. Ask your health care provider if you are at risk.  Talk with your health care provider about whether you are at high risk of being infected with HIV. Your health care provider may recommend a prescription medicine to help prevent HIV  infection. What else can I do?  Schedule regular health, dental, and eye exams.  Stay current with your vaccines (immunizations).  Do not use any tobacco products, such as cigarettes, chewing tobacco, and e-cigarettes. If you need help quitting, ask your health care provider.  Limit alcohol intake to no more than 2 drinks per day. One drink equals 12 ounces of beer, 5 ounces of , or 1 ounces of hard liquor.  Do not use street drugs.  Do not share needles.  Ask your health care provider for help if you need support or information about quitting drugs.  Tell your health care provider if you often feel depressed.  Tell your health care provider if you have ever been abused or do not feel safe at home. This information is not intended to replace advice given to you by your health care provider. Make sure you discuss any questions you have with your health care provider. Document Released: 02/16/2008 Document Revised: 04/18/2016 Document Reviewed: 05/24/2015 Elsevier Interactive Patient Education  2019 [employer].

## 2018-08-26 MED ORDER — ACETAMINOPHEN-CODEINE #3 300-30 MG PO TABS: 1.0000 | ORAL_TABLET | Freq: Four times a day (QID) | ORAL | 1 refills | Status: DC | PRN

## 2018-08-26 NOTE — Telephone Encounter (Signed)
Ok Thx 

## 2018-09-19 ENCOUNTER — Telehealth: Payer: Self-pay | Admitting: Internal Medicine

## 2018-09-19 NOTE — Telephone Encounter (Signed)
Rec'd from [facility] forwarded 4 pages to Dr. Cassandria Anger

## 2018-09-20 ENCOUNTER — Other Ambulatory Visit: Payer: Self-pay | Admitting: Internal Medicine

## 2018-09-25 ENCOUNTER — Ambulatory Visit (INDEPENDENT_AMBULATORY_CARE_PROVIDER_SITE_OTHER)
Admission: RE | Admit: 2018-09-25 | Discharge: 2018-09-25 | Disposition: A | Discharge status: Home / Self Care | Payer: Medicare Other | Source: Ambulatory Visit | Attending: Internal Medicine | Admitting: Internal Medicine

## 2018-09-25 ENCOUNTER — Encounter: Payer: Self-pay | Admitting: Internal Medicine

## 2018-09-25 ENCOUNTER — Ambulatory Visit (INDEPENDENT_AMBULATORY_CARE_PROVIDER_SITE_OTHER): Payer: Medicare Other | Admitting: Internal Medicine

## 2018-09-25 VITALS — BP 136/72 | HR 57 | Temp 98.1°F | Ht 74.0 in | Wt 267.0 lb

## 2018-09-25 DIAGNOSIS — C61 Malignant neoplasm of prostate: Secondary | ICD-10-CM | POA: Diagnosis not present

## 2018-09-25 DIAGNOSIS — M25511 Pain in right shoulder: Secondary | ICD-10-CM

## 2018-09-25 DIAGNOSIS — M10072 Idiopathic gout, left ankle and foot: Secondary | ICD-10-CM

## 2018-09-25 DIAGNOSIS — M7521 Bicipital tendinitis, right shoulder: Secondary | ICD-10-CM

## 2018-09-25 DIAGNOSIS — M7520 Bicipital tendinitis, unspecified shoulder: Secondary | ICD-10-CM | POA: Insufficient documentation

## 2018-09-25 MED ORDER — DICLOFENAC SODIUM 1 % TD GEL: 2.0000 g | Freq: Two times a day (BID) | TRANSDERMAL | 2 refills | Status: DC

## 2018-09-25 NOTE — Assessment & Plan Note (Signed)
Will inject Voltaren

## 2018-09-25 NOTE — Assessment & Plan Note (Signed)
Dr Tresa Moore q 1 year

## 2018-09-25 NOTE — Assessment & Plan Note (Signed)
No relapse 

## 2018-09-25 NOTE — Patient Instructions (Signed)
Postprocedure instructions :    A Band-Aid should be left on for 12 hours. Injection therapy is not a cure itself. It is used in conjunction with other modalities. You can use nonsteroidal anti-inflammatories like ibuprofen , hot and cold compresses. Rest is recommended in the next 24 hours. You need to report immediately  if fever, chills or any signs of infection develop. 

## 2018-09-25 NOTE — Progress Notes (Signed)
Subjective:  Patient ID: [name] [name], male    DOB: 05-31-1951  Age: 68 y.o. MRN: [id number]  CC: Shoulder Pain   HPI MARTEL GALVAN presents for R shoulder pain x 2 weeks - bad No injury R handed  Outpatient Medications Prior to Visit  Medication Sig Dispense Refill  . acetaminophen-codeine (TYLENOL #3) 300-30 MG tablet Take 1-2 tablets by mouth every 6 (six) hours as needed for moderate pain. 60 tablet 1  . amLODipine-olmesartan (AZOR) 10-40 MG tablet Take 1 tablet by mouth every morning. Patient needs office visit before refills will be given 90 tablet 0  . aspirin EC 81 MG tablet Take 81 mg by mouth daily.    . blood glucose meter kit and supplies KIT Dispense based on patient and insurance preference. Use up to four times daily as directed. (FOR ICD-9 250.00, 250.01). 1 each 0  . Carboxymethylcellulose Sodium (REFRESH LIQUIGEL OP) Apply 2 drops to eye as needed (for dry eyes).     . colchicine 0.6 MG tablet Take 1 tablet (0.6 mg total) by mouth daily as needed. For gout 30 tablet 3  . diclofenac sodium (VOLTAREN) 1 % GEL Apply 2 g topically 2 (two) times daily. 1 Tube 1  . fluticasone (FLONASE) 50 MCG/ACT nasal spray USE ONE SPRAY IN EACH NOSTRIL DAILY 48 g 3  . furosemide (LASIX) 20 MG tablet Take 1-2 tablets (20-40 mg total) by mouth daily as needed for edema. 180 tablet 3  . hydrochlorothiazide (HYDRODIURIL) 25 MG tablet Take 1 tablet (25 mg total) by mouth daily. 25 tablet 3  . indomethacin (INDOCIN) 50 MG capsule Take 1 capsule (50 mg total) by mouth 3 (three) times daily as needed for moderate pain (gout). 60 capsule 3  . Iron-Vitamins (GERITOL COMPLETE PO) Take 1 tablet by mouth every morning. With Iron    . metoprolol tartrate (LOPRESSOR) 50 MG tablet Take 1 tablet (50 mg total) by mouth daily. Follow-up appt due in Oct must see provider for future refills 90 tablet 0  . montelukast (SINGULAIR) 10 MG tablet Take by mouth.    . Multiple Vitamin (MULTIVITAMIN) tablet  Take 1 tablet by mouth daily.    . pioglitazone-metformin (ACTOPLUS MET) 15-850 MG tablet TAKE 1 TABLET BY MOUTH TWICE DAILY( EVERY TWELVE HOURS) 60 tablet 11  . pravastatin (PRAVACHOL) 40 MG tablet TAKE ONE TABLET BY MOUTH EACH EVENING 90 tablet 3  . tamsulosin (FLOMAX) 0.4 MG CAPS capsule Take 1 capsule (0.4 mg total) by mouth every morning. 90 capsule 3   Facility-Administered Medications Prior to Visit  Medication Dose Route Frequency Provider Last Rate Last Dose  . 0.9 %  sodium chloride infusion  500 mL Intravenous Once Irene Shipper, MD        ROS: Review of Systems  Constitutional: Negative for appetite change, fatigue and unexpected weight change.  HENT: Negative for congestion, nosebleeds, sneezing, sore throat and trouble swallowing.   Eyes: Negative for itching and visual disturbance.  Respiratory: Negative for cough.   Cardiovascular: Negative for chest pain, palpitations and leg swelling.  Gastrointestinal: Negative for abdominal distention, blood in stool, diarrhea and nausea.  Genitourinary: Negative for frequency and hematuria.  Musculoskeletal: Negative for back pain, gait problem, joint swelling and neck pain.  Skin: Negative for rash.  Neurological: Negative for dizziness, tremors, speech difficulty and weakness.  Psychiatric/Behavioral: Negative for agitation, dysphoric mood and sleep disturbance. The patient is not nervous/anxious.     Objective:  BP 136/72 (BP Location:  Left Arm, Patient Position: Sitting, Cuff Size: Large)   Pulse (!) 57   Temp 98.1 F (36.7 C) (Oral)   Ht '6\' 2"'  (1.88 m)   Wt 267 lb (121.1 kg)   SpO2 97%   BMI 34.28 kg/m   BP Readings from Last 3 Encounters:  09/25/18 136/72  08/25/18 (!) 142/76  05/06/18 132/80    Wt Readings from Last 3 Encounters:  09/25/18 267 lb (121.1 kg)  08/25/18 271 lb (122.9 kg)  05/06/18 261 lb 3.2 oz (118.5 kg)    Physical Exam Constitutional:      General: He is not in acute distress.     Appearance: He is well-developed.     Comments: NAD  Eyes:     Conjunctiva/sclera: Conjunctivae normal.     Pupils: Pupils are equal, round, and reactive to light.  Neck:     Musculoskeletal: Normal range of motion.     Thyroid: No thyromegaly.     Vascular: No JVD.  Cardiovascular:     Rate and Rhythm: Normal rate and regular rhythm.     Heart sounds: Normal heart sounds. No murmur. No friction rub. No gallop.   Pulmonary:     Effort: Pulmonary effort is normal. No respiratory distress.     Breath sounds: Normal breath sounds. No wheezing or rales.  Chest:     Chest wall: No tenderness.  Abdominal:     General: Bowel sounds are normal. There is no distension.     Palpations: Abdomen is soft. There is no mass.     Tenderness: There is no abdominal tenderness. There is no guarding or rebound.  Musculoskeletal: Normal range of motion.        General: No tenderness.  Lymphadenopathy:     Cervical: No cervical adenopathy.  Skin:    General: Skin is warm and dry.     Findings: No rash.  Neurological:     Mental Status: He is alert and oriented to person, place, and time.     Cranial Nerves: No cranial nerve deficit.     Motor: No abnormal muscle tone.     Coordination: Coordination normal.     Gait: Gait normal.     Deep Tendon Reflexes: Reflexes are normal and symmetric.  Psychiatric:        Behavior: Behavior normal.        Thought Content: Thought content normal.        Judgment: Judgment normal.    Procedure :Joint Injection,  R shoulder   Indication:  Subacromial bursitis with refractory  Acute on chronic pain.   Risks including unsuccessful procedure , bleeding, infection, bruising, skin atrophy, "steroid flare-up" and others were explained to the patient in detail as well as the benefits. Informed consent was obtained and signed.   Tthe patient was placed in a comfortable position. Lateral approach was used. Skin was prepped with Betadine and alcohol  and anesthetized  with a cooling spray. Then, a 5 cc syringe with a 2 inch long 24-gauge needle was used for a joint injection.. The needle was advanced  Into the subacromial space.The bursa was injected with 3 mL of 2% lidocaine and 40 mg of Depo-Medrol .  Band-Aid was applied.   Tolerated well. Complications: None. Good pain relief following the procedure.    Procedure :  Biceps tendon injection R   Indication:  Bicipital tendonitis with refractory  Acute on chronic pain.   Risks including unsuccessful procedure , bleeding, infection, bruising, skin atrophy  and others were explained to the patient in detail as well as the benefits. Informed consent was obtained and signed.   Tthe patient was placed in a comfortable position. Skin was prepped with Betadine and alcohol  and anesthetized with a cooling spray. Then, a 5 cc syringe with a 1.5 inch long 25-gauge needle was used for a tendon injection with 2 mL of 2% lidocaine and 20 mg of Depo-Medrol .  Band-Aid was applied.     Tolerated well. Complications: None. Good pain relief following the procedure.     Lab Results  Component Value Date   WBC 5.7 01/01/2018   HGB 13.7 01/01/2018   HCT 40.8 01/01/2018   PLT 302.0 01/01/2018   GLUCOSE 101 (H) 01/01/2018   CHOL 124 01/01/2018   TRIG 77.0 01/01/2018   HDL 41.40 01/01/2018   LDLDIRECT 125.4 07/14/2008   LDLCALC 67 01/01/2018   ALT 10 01/01/2018   AST 14 01/01/2018   NA 141 01/01/2018   K 3.5 01/01/2018   CL 101 01/01/2018   CREATININE 1.28 01/01/2018   BUN 22 01/01/2018   CO2 29 01/01/2018   TSH 2.84 01/01/2018   PSA 0.07 (L) 10/29/2016   INR 1.14 06/23/2016   HGBA1C 6.6 (H) 01/01/2018   MICROALBUR 1.3 01/01/2018    US Abdomen Complete  Result Date: 01/14/2018 CLINICAL DATA:  Abdominal pain, right upper quadrant abdominal mass. History of right hepatic lobe resection for benign tumor in 2014. History of rectal malignancy. EXAM: ABDOMEN ULTRASOUND COMPLETE COMPARISON:  Abdominal and  pelvic CT scan dated Jan 25, 2015 FINDINGS: Gallbladder: No gallstones or wall thickening visualized. No sonographic Murphy sign noted by sonographer. Common bile duct: Diameter: 4.4 mm Liver: The hepatic echotexture is normal. There is a 1.2 cm diameter cyst in the right lobe. There is no intrahepatic ductal dilation. Portal vein is patent on color Doppler imaging with normal direction of blood flow towards the liver. IVC: The visualized portion is unremarkable. Pancreas: Visualized portion unremarkable. Spleen: Size and appearance within normal limits. Right Kidney: Length: 11.1 cm. Echogenicity within normal limits. No mass or hydronephrosis visualized. Left Kidney: Length: 11.7 cm. Echogenicity within normal limits. There is a midpole cyst measuring 2.4 x 1.7 x 2.1 cm. There is a lower pole cyst measuring 1.3 x 1.4 x 1.4 cm. No solid mass or hydronephrosis visualized. Abdominal aorta: No aneurysm visualized. Other findings: None. IMPRESSION: No acute hepatic abnormality. Stable 1.2 cm diameter right lobe cyst. No gallstones or sonographic evidence of acute cholecystitis. Simple appearing cysts in the left kidney which appears stable. Electronically Signed   By: David  Martinique M.D.   On: 01/14/2018 10:32    Assessment & Plan:   There are no diagnoses linked to this encounter.   No orders of the defined types were placed in this encounter.    Follow-up: No follow-ups on file.  Walker Kehr, MD

## 2018-09-25 NOTE — Assessment & Plan Note (Signed)
X ray Will inject Voltaren gel

## 2018-11-19 ENCOUNTER — Encounter: Payer: Self-pay | Admitting: Internal Medicine

## 2018-12-12 ENCOUNTER — Other Ambulatory Visit: Payer: Self-pay | Admitting: Internal Medicine

## 2019-01-01 ENCOUNTER — Encounter: Payer: Medicare Other | Admitting: Internal Medicine

## 2019-02-12 ENCOUNTER — Ambulatory Visit (AMBULATORY_SURGERY_CENTER): Payer: Self-pay

## 2019-02-12 ENCOUNTER — Other Ambulatory Visit: Payer: Self-pay

## 2019-02-12 VITALS — Ht 74.0 in | Wt 268.0 lb

## 2019-02-12 DIAGNOSIS — Z85038 Personal history of other malignant neoplasm of large intestine: Secondary | ICD-10-CM

## 2019-02-12 NOTE — Progress Notes (Signed)
Denies allergies to eggs or soy products. Denies complication of anesthesia or sedation. Denies use of weight loss medication. Denies use of O2.   Emmi instructions given for Flexible Sigmoidoscopy.   Pre-Visit was conducted by phone due to Covid 19. Instructions were reviewed and mailed to patients confirmed home address. Patient was encouraged to call if he had questions or concerns regarding instructions.

## 2019-02-26 ENCOUNTER — Telehealth: Payer: Self-pay | Admitting: Internal Medicine

## 2019-02-26 NOTE — Telephone Encounter (Signed)
Spoke with patient regarding Covid-19 screening procedure Covid-19 Screening Questions:  Do you now or have you had a fever in the last 14 days? no  Do you have any respiratory symptoms of shortness of breath or cough now or in the last 14 days? no  Do you have any family members or close contacts with diagnosed or suspected Covid-19 in the past 14 days? no  Have you been tested for Covid-19 and found to be positive? no  Pt made aware of that care partner may wait in the car or come up to the lobby during the procedure but will need to provide their own mask.

## 2019-02-27 ENCOUNTER — Other Ambulatory Visit: Payer: Self-pay

## 2019-02-27 ENCOUNTER — Encounter: Payer: Self-pay | Admitting: Internal Medicine

## 2019-02-27 ENCOUNTER — Ambulatory Visit (AMBULATORY_SURGERY_CENTER): Payer: Medicare Other | Admitting: Internal Medicine

## 2019-02-27 VITALS — BP 130/70 | HR 69 | Temp 98.8°F | Resp 20 | Ht 74.0 in | Wt 268.0 lb

## 2019-02-27 DIAGNOSIS — Z85038 Personal history of other malignant neoplasm of large intestine: Secondary | ICD-10-CM

## 2019-02-27 MED ORDER — SODIUM CHLORIDE 0.9 % IV SOLN: 500.0000 mL | Freq: Once | INTRAVENOUS | Status: DC

## 2019-02-27 NOTE — Progress Notes (Signed)
CW temp JB vitals   Pt's states no medical or surgical changes since previsit or office visit.

## 2019-02-27 NOTE — Op Note (Signed)
[name] Patient Name: [name] [name] Procedure Date: 02/27/2019 8:43 AM MRN: [id number] Endoscopist: Docia Chuck. [name] , MD Age: 68 Referring MD:  Date of Birth: [birth year] Gender: Male Account #: [id number] Procedure:                Flexible Sigmoidoscopy Indications:              High risk colon cancer surveillance: Personal                            history of adenoma (10 mm or greater in size), High                            risk colon cancer surveillance: Personal history of                            adenoma with high grade dysplasia, High risk colon                            cancer surveillance: Personal history of multiple                            (3 or more) adenomas, High risk colon cancer                            surveillance: Personal history of colon cancer.                            2016 with malignant polyp at the anal verge.                            Residual adenoma 2017. Subsequent follow-up 2017-.                            Last evaluation March 2019 with residual adenoma                            with high-grade dysplasia felt to be entirely                            removed Medicines:                None, Monitored Anesthesia Care Procedure:                Pre-Anesthesia Assessment:                           - Prior to the procedure, a History and Physical                            was performed, and patient medications and                            allergies were reviewed. The patient's tolerance of  previous anesthesia was also reviewed. The risks                            and benefits of the procedure and the sedation                            options and risks were discussed with the patient.                            All questions were answered, and informed consent                            was obtained. Prior Anticoagulants: The patient has                            taken no previous anticoagulant or  antiplatelet                            agents. ASA Grade Assessment: II - A patient with                            mild systemic disease. After reviewing the risks                            and benefits, the patient was deemed in                            satisfactory condition to undergo the procedure.                           After obtaining informed consent, the scope was                            passed under direct vision. The Colonoscope was                            introduced through the anus and advanced to the the                            sigmoid colon. The flexible sigmoidoscopy was                            accomplished without difficulty. The patient                            tolerated the procedure well. The quality of the                            bowel preparation was excellent. Scope In: Scope Out: Findings:                 The perianal and digital rectal examinations were  normal.                           The entire examined colon appeared normal. No                            evidence for residual neoplasia Complications:            No immediate complications. Estimated Blood Loss:     Estimated blood loss: none. Impression:               - The entire examined colon is normal. No evidence                            for residual neoplasia                           - No specimens collected. Recommendation:           - Patient has a contact number available for                            emergencies. The signs and symptoms of potential                            delayed complications were discussed with the                            patient. Return to normal activities tomorrow.                            Written discharge instructions were provided to the                            patient.                           - Resume previous diet.                           - Repeat colonoscopy in 3 years John N. [name], MD 02/27/2019  9:14:51 AM This report has been signed electronically.

## 2019-02-27 NOTE — Patient Instructions (Signed)
Repeat colonoscopy in 3 years. Please note the 24/7 phone number circled in red below to report any adverse symptoms. Absolutely no driving today.     YOU HAD AN ENDOSCOPIC PROCEDURE TODAY AT [facility]:   Refer to the procedure report that was given to you for any specific questions about what was found during the examination.  If the procedure report does not answer your questions, please call your gastroenterologist to clarify.  If you requested that your care partner not be given the details of your procedure findings, then the procedure report has been included in a sealed envelope for you to review at your convenience later.  YOU SHOULD EXPECT: Some feelings of bloating in the abdomen. Passage of more gas than usual.  Walking can help get rid of the air that was put into your GI tract during the procedure and reduce the bloating. If you had a lower endoscopy (such as a colonoscopy or flexible sigmoidoscopy) you may notice spotting of blood in your stool or on the toilet paper. If you underwent a bowel prep for your procedure, you may not have a normal bowel movement for a few days.  Please Note:  You might notice some irritation and congestion in your nose or some drainage.  This is from the oxygen used during your procedure.  There is no need for concern and it should clear up in a day or so.  SYMPTOMS TO REPORT IMMEDIATELY:   Following lower endoscopy (colonoscopy or flexible sigmoidoscopy):  Excessive amounts of blood in the stool  Significant tenderness or worsening of abdominal pains  Swelling of the abdomen that is new, acute  Fever of 100F or higher   For urgent or emergent issues, a gastroenterologist can be reached at any hour by calling [phone].   DIET:  We do recommend a small meal at first, but then you may proceed to your regular diet.  Drink plenty of fluids but you should avoid alcoholic beverages for 24 hours.  ACTIVITY:  You should plan to  take it easy for the rest of today and you should NOT DRIVE or use heavy machinery until tomorrow (because of the sedation medicines used during the test).    FOLLOW UP: Our staff will call the number listed on your records 48-72 hours following your procedure to check on you and address any questions or concerns that you may have regarding the information given to you following your procedure. If we do not reach you, we will leave a message.  We will attempt to reach you two times.  During this call, we will ask if you have developed any symptoms of COVID 19. If you develop any symptoms (ie: fever, flu-like symptoms, shortness of breath, cough etc.) before then, please call [phone].  If you test positive for Covid 19 in the 2 weeks post procedure, please call and report this information to [place].    If any biopsies were taken you will be contacted by phone or by letter within the next 1-3 weeks.  Please call us at [phone] if you have not heard about the biopsies in 3 weeks.    SIGNATURES/CONFIDENTIALITY: You and/or your care partner have signed paperwork which will be entered into your electronic medical record.  These signatures attest to the fact that that the information above on your After Visit Summary has been reviewed and is understood.  Full responsibility of the confidentiality of this discharge information lies with you and/or your  care-partner.

## 2019-02-27 NOTE — Progress Notes (Signed)
PT taken to PACU. Monitors in place. VSS. Report given to RN. 

## 2019-03-03 ENCOUNTER — Telehealth: Payer: Self-pay

## 2019-03-03 NOTE — Telephone Encounter (Signed)
  Follow up Call-  Call back number 02/27/2019 11/29/2017 10/22/2016 08/16/2016  Post procedure Call Back phone  # [phone]  Permission to leave phone message Yes Yes Yes Yes  Some recent data might be hidden     Patient questions:  Do you have a fever, pain , or abdominal swelling? No. Pain Score  0 *  Have you tolerated food without any problems? Yes.    Have you been able to return to your normal activities? Yes.    Do you have any questions about your discharge instructions: Diet   No. Medications  No. Follow up visit  No.  Do you have questions or concerns about your Care? No.  Actions: * If pain score is 4 or above: No action needed, pain <4.  1. Have you developed a fever since your procedure? no  2.   Have you had an respiratory symptoms (SOB or cough) since your procedure? no  3.   Have you tested positive for COVID 19 since your procedure no  4.   Have you had any family members/close contacts diagnosed with the COVID 19 since your procedure?  no   If yes to any of these questions please route to Joylene John, RN and Alphonsa Gin, Therapist, sports.

## 2019-03-10 ENCOUNTER — Other Ambulatory Visit (INDEPENDENT_AMBULATORY_CARE_PROVIDER_SITE_OTHER): Payer: Medicare Other

## 2019-03-10 ENCOUNTER — Encounter: Payer: Self-pay | Admitting: Internal Medicine

## 2019-03-10 ENCOUNTER — Other Ambulatory Visit: Payer: Self-pay

## 2019-03-10 ENCOUNTER — Ambulatory Visit (INDEPENDENT_AMBULATORY_CARE_PROVIDER_SITE_OTHER): Payer: Medicare Other | Admitting: Internal Medicine

## 2019-03-10 VITALS — BP 126/68 | HR 66 | Temp 98.7°F | Ht 74.0 in | Wt 271.0 lb

## 2019-03-10 DIAGNOSIS — E118 Type 2 diabetes mellitus with unspecified complications: Secondary | ICD-10-CM

## 2019-03-10 DIAGNOSIS — Z0000 Encounter for general adult medical examination without abnormal findings: Secondary | ICD-10-CM

## 2019-03-10 DIAGNOSIS — I10 Essential (primary) hypertension: Secondary | ICD-10-CM

## 2019-03-10 DIAGNOSIS — I480 Paroxysmal atrial fibrillation: Secondary | ICD-10-CM | POA: Diagnosis not present

## 2019-03-10 DIAGNOSIS — R160 Hepatomegaly, not elsewhere classified: Secondary | ICD-10-CM

## 2019-03-10 DIAGNOSIS — C61 Malignant neoplasm of prostate: Secondary | ICD-10-CM | POA: Diagnosis not present

## 2019-03-10 DIAGNOSIS — M10072 Idiopathic gout, left ankle and foot: Secondary | ICD-10-CM

## 2019-03-10 LAB — URINALYSIS
Bilirubin Urine: NEGATIVE
Hgb urine dipstick: NEGATIVE
Ketones, ur: NEGATIVE
Leukocytes,Ua: NEGATIVE
Nitrite: NEGATIVE
Specific Gravity, Urine: 1.02 (ref 1.000–1.030)
Total Protein, Urine: NEGATIVE
Urine Glucose: NEGATIVE
Urobilinogen, UA: 0.2 (ref 0.0–1.0)
pH: 6.5 (ref 5.0–8.0)

## 2019-03-10 LAB — CBC WITH DIFFERENTIAL/PLATELET
Basophils Absolute: 0 10*3/uL (ref 0.0–0.1)
Basophils Relative: 0.7 % (ref 0.0–3.0)
Eosinophils Absolute: 0.1 10*3/uL (ref 0.0–0.7)
Eosinophils Relative: 2.2 % (ref 0.0–5.0)
HCT: 42.1 % (ref 39.0–52.0)
Hemoglobin: 14.1 g/dL (ref 13.0–17.0)
Lymphocytes Relative: 28.1 % (ref 12.0–46.0)
Lymphs Abs: 1.4 10*3/uL (ref 0.7–4.0)
MCHC: 33.6 g/dL (ref 30.0–36.0)
MCV: 89.3 fl (ref 78.0–100.0)
Monocytes Absolute: 0.4 10*3/uL (ref 0.1–1.0)
Monocytes Relative: 8.7 % (ref 3.0–12.0)
Neutro Abs: 3 10*3/uL (ref 1.4–7.7)
Neutrophils Relative %: 60.3 % (ref 43.0–77.0)
Platelets: 257 10*3/uL (ref 150.0–400.0)
RBC: 4.71 Mil/uL (ref 4.22–5.81)
RDW: 13.9 % (ref 11.5–15.5)
WBC: 5 10*3/uL (ref 4.0–10.5)

## 2019-03-10 LAB — HEPATIC FUNCTION PANEL
ALT: 14 U/L (ref 0–53)
AST: 15 U/L (ref 0–37)
Albumin: 3.9 g/dL (ref 3.5–5.2)
Alkaline Phosphatase: 68 U/L (ref 39–117)
Bilirubin, Direct: 0.1 mg/dL (ref 0.0–0.3)
Total Bilirubin: 0.8 mg/dL (ref 0.2–1.2)
Total Protein: 6.8 g/dL (ref 6.0–8.3)

## 2019-03-10 LAB — BASIC METABOLIC PANEL
BUN: 16 mg/dL (ref 6–23)
CO2: 32 mEq/L (ref 19–32)
Calcium: 9.2 mg/dL (ref 8.4–10.5)
Chloride: 102 mEq/L (ref 96–112)
Creatinine, Ser: 1.14 mg/dL (ref 0.40–1.50)
GFR: 76.96 mL/min (ref >60.00)
Glucose, Bld: 126 mg/dL — ABNORMAL HIGH (ref 70–99)
Potassium: 3.6 mEq/L (ref 3.5–5.1)
Sodium: 143 mEq/L (ref 135–145)

## 2019-03-10 LAB — PSA: PSA: 0.02 ng/mL — ABNORMAL LOW (ref 0.10–4.00)

## 2019-03-10 LAB — LIPID PANEL
Cholesterol: 179 mg/dL (ref 0–200)
HDL: 44.6 mg/dL (ref >39.00)
LDL Cholesterol: 111 mg/dL — ABNORMAL HIGH (ref 0–99)
NonHDL: 134.47
Total CHOL/HDL Ratio: 4
Triglycerides: 115 mg/dL (ref 0.0–149.0)
VLDL: 23 mg/dL (ref 0.0–40.0)

## 2019-03-10 LAB — MICROALBUMIN / CREATININE URINE RATIO
Creatinine,U: 145.4 mg/dL
Microalb Creat Ratio: 0.5 mg/g (ref 0.0–30.0)
Microalb, Ur: <0.7 mg/dL (ref 0.0–1.9)

## 2019-03-10 LAB — TSH: TSH: 2.98 u[IU]/mL (ref 0.35–4.50)

## 2019-03-10 LAB — HEMOGLOBIN A1C: Hgb A1c MFr Bld: 7 % — ABNORMAL HIGH (ref 4.6–6.5)

## 2019-03-10 MED ORDER — FUROSEMIDE 20 MG PO TABS: 20.0000 mg | ORAL_TABLET | Freq: Every day | ORAL | 3 refills | Status: DC | PRN

## 2019-03-10 MED ORDER — METOPROLOL TARTRATE 50 MG PO TABS: 50.0000 mg | ORAL_TABLET | Freq: Every day | ORAL | 3 refills | Status: DC

## 2019-03-10 MED ORDER — TAMSULOSIN HCL 0.4 MG PO CAPS: 0.4000 mg | ORAL_CAPSULE | Freq: Every morning | ORAL | 3 refills | Status: DC

## 2019-03-10 MED ORDER — AMLODIPINE-OLMESARTAN 10-40 MG PO TABS: 1.0000 | ORAL_TABLET | Freq: Every day | ORAL | 3 refills | Status: DC

## 2019-03-10 MED ORDER — PIOGLITAZONE HCL-METFORMIN HCL 15-850 MG PO TABS: ORAL_TABLET | ORAL | 3 refills | Status: DC

## 2019-03-10 MED ORDER — PRAVASTATIN SODIUM 40 MG PO TABS: ORAL_TABLET | ORAL | 3 refills | Status: DC

## 2019-03-10 MED ORDER — HYDROCHLOROTHIAZIDE 25 MG PO TABS: 25.0000 mg | ORAL_TABLET | Freq: Every day | ORAL | 3 refills | Status: DC

## 2019-03-10 MED ORDER — INDOMETHACIN 50 MG PO CAPS: 50.0000 mg | ORAL_CAPSULE | Freq: Three times a day (TID) | ORAL | 3 refills | Status: DC | PRN

## 2019-03-10 MED ORDER — PANTOPRAZOLE SODIUM 20 MG PO TBEC: 20.0000 mg | DELAYED_RELEASE_TABLET | Freq: Two times a day (BID) | ORAL | 3 refills | Status: DC

## 2019-03-10 MED ORDER — COLCHICINE 0.6 MG PO TABS: 0.6000 mg | ORAL_TABLET | Freq: Every day | ORAL | 3 refills | Status: DC | PRN

## 2019-03-10 NOTE — Assessment & Plan Note (Signed)
[facility]

## 2019-03-10 NOTE — Assessment & Plan Note (Signed)
F/u w/Dr Perry 

## 2019-03-10 NOTE — Assessment & Plan Note (Signed)
We discussed age appropriate health related issues, including available/recomended screening tests and vaccinations. We discussed a need for adhering to healthy diet and exercise. Labs were ordered to be later reviewed . All questions were answered.  F/u w/Dr Tresa Moore and PSA q 6 mo Dr [name] - GI - sigmoidoscopy 02/2019 shingrix 2019

## 2019-03-10 NOTE — Patient Instructions (Signed)
If you have medicare related insurance (such as traditional Medicare, Blue Cross Medicare, United HealthCare Medicare, or similar), Please make an appointment at the scheduling desk with Jill, the Wellness Health Coach, for your Wellness visit in this office, which is a benefit with your insurance.  

## 2019-03-10 NOTE — Assessment & Plan Note (Signed)
Labs Meds renewed

## 2019-03-10 NOTE — Assessment & Plan Note (Signed)
Labs

## 2019-03-10 NOTE — Progress Notes (Signed)
Subjective:  Patient ID: [name] [name], male    DOB: [birth year]  Age: 68 y.o. MRN: [id number]  CC: No chief complaint on file.   HPI [name] presents for a well exam  Outpatient Medications Prior to Visit  Medication Sig Dispense Refill  . acetaminophen-codeine (TYLENOL #3) 300-30 MG tablet Take 1-2 tablets by mouth every 6 (six) hours as needed for moderate pain. 60 tablet 1  . amLODipine-olmesartan (AZOR) 10-40 MG tablet TAKE 1 TABLET BY MOUTH EVERY DAY 30 tablet 2  . aspirin EC 81 MG tablet Take 81 mg by mouth daily.    . blood glucose meter kit and supplies KIT Dispense based on patient and insurance preference. Use up to four times daily as directed. (FOR ICD-9 250.00, 250.01). 1 each 0  . Carboxymethylcellulose Sodium (REFRESH LIQUIGEL OP) Apply 2 drops to eye as needed (for dry eyes).     . colchicine 0.6 MG tablet Take 1 tablet (0.6 mg total) by mouth daily as needed. For gout 30 tablet 3  . diclofenac sodium (VOLTAREN) 1 % GEL Apply 2 g topically 2 (two) times daily. 1 Tube 2  . fluticasone (FLONASE) 50 MCG/ACT nasal spray USE ONE SPRAY IN EACH NOSTRIL DAILY 48 g 3  . furosemide (LASIX) 20 MG tablet Take 1-2 tablets (20-40 mg total) by mouth daily as needed for edema. 180 tablet 3  . hydrochlorothiazide (HYDRODIURIL) 25 MG tablet Take 1 tablet (25 mg total) by mouth daily. 25 tablet 3  . indomethacin (INDOCIN) 50 MG capsule Take 1 capsule (50 mg total) by mouth 3 (three) times daily as needed for moderate pain (gout). 60 capsule 3  . Iron-Vitamins (GERITOL COMPLETE PO) Take 1 tablet by mouth every morning. With Iron    . metoprolol tartrate (LOPRESSOR) 50 MG tablet Take 1 tablet (50 mg total) by mouth daily. Follow-up appt due in Oct must see provider for future refills 90 tablet 0  . pantoprazole (PROTONIX) 20 MG tablet Take 20 mg by mouth 2 (two) times daily before a meal.    . pioglitazone-metformin (ACTOPLUS MET) 15-850 MG tablet TAKE 1 TABLET BY MOUTH TWICE  DAILY( EVERY TWELVE HOURS) 60 tablet 11  . pravastatin (PRAVACHOL) 40 MG tablet TAKE ONE TABLET BY MOUTH EACH EVENING 90 tablet 3  . tamsulosin (FLOMAX) 0.4 MG CAPS capsule Take 1 capsule (0.4 mg total) by mouth every morning. 90 capsule 3   No facility-administered medications prior to visit.     ROS: Review of Systems  Constitutional: Negative for appetite change, fatigue and unexpected weight change.  HENT: Negative for congestion, nosebleeds, sneezing, sore throat and trouble swallowing.   Eyes: Negative for itching and visual disturbance.  Respiratory: Negative for cough.   Cardiovascular: Negative for chest pain, palpitations and leg swelling.  Gastrointestinal: Negative for abdominal distention, blood in stool, diarrhea and nausea.  Genitourinary: Negative for frequency and hematuria.  Musculoskeletal: Negative for back pain, gait problem, joint swelling and neck pain.  Skin: Negative for rash.  Neurological: Negative for dizziness, tremors, speech difficulty and weakness.  Psychiatric/Behavioral: Negative for agitation, dysphoric mood, sleep disturbance and suicidal ideas. The patient is not nervous/anxious.     Objective:  BP 126/68 (BP Location: Left Arm, Patient Position: Sitting, Cuff Size: Large)   Pulse 66   Temp 98.7 F (37.1 C) (Oral)   Ht '6\' 2"'$  (1.88 m)   Wt 271 lb (122.9 kg)   SpO2 96%   BMI 34.79 kg/m   BP Readings  from Last 3 Encounters:  03/10/19 126/68  02/27/19 130/70  09/25/18 136/72    Wt Readings from Last 3 Encounters:  03/10/19 271 lb (122.9 kg)  02/27/19 268 lb (121.6 kg)  02/12/19 268 lb (121.6 kg)    Physical Exam Constitutional:      General: He is not in acute distress.    Appearance: He is well-developed.     Comments: NAD  Eyes:     Conjunctiva/sclera: Conjunctivae normal.     Pupils: Pupils are equal, round, and reactive to light.  Neck:     Musculoskeletal: Normal range of motion.     Thyroid: No thyromegaly.     Vascular:  No JVD.  Cardiovascular:     Rate and Rhythm: Normal rate and regular rhythm.     Heart sounds: Normal heart sounds. No murmur. No friction rub. No gallop.   Pulmonary:     Effort: Pulmonary effort is normal. No respiratory distress.     Breath sounds: Normal breath sounds. No wheezing or rales.  Chest:     Chest wall: No tenderness.  Abdominal:     General: Bowel sounds are normal. There is no distension.     Palpations: Abdomen is soft. There is no mass.     Tenderness: There is no abdominal tenderness. There is no guarding or rebound.  Musculoskeletal: Normal range of motion.        General: No tenderness.  Lymphadenopathy:     Cervical: No cervical adenopathy.  Skin:    General: Skin is warm and dry.     Findings: No rash.  Neurological:     Mental Status: He is alert and oriented to person, place, and time.     Cranial Nerves: No cranial nerve deficit.     Motor: No abnormal muscle tone.     Coordination: Coordination normal.     Gait: Gait normal.     Deep Tendon Reflexes: Reflexes are normal and symmetric.  Psychiatric:        Behavior: Behavior normal.        Thought Content: Thought content normal.        Judgment: Judgment normal.   obese Rectal - recent by Dr [name]   Lab Results  Component Value Date   WBC 5.7 01/01/2018   HGB 13.7 01/01/2018   HCT 40.8 01/01/2018   PLT 302.0 01/01/2018   GLUCOSE 101 (H) 01/01/2018   CHOL 124 01/01/2018   TRIG 77.0 01/01/2018   HDL 41.40 01/01/2018   LDLDIRECT 125.4 07/14/2008   LDLCALC 67 01/01/2018   ALT 10 01/01/2018   AST 14 01/01/2018   NA 141 01/01/2018   K 3.5 01/01/2018   CL 101 01/01/2018   CREATININE 1.28 01/01/2018   BUN 22 01/01/2018   CO2 29 01/01/2018   TSH 2.84 01/01/2018   PSA 0.07 (L) 10/29/2016   INR 1.14 06/23/2016   HGBA1C 6.6 (H) 01/01/2018   MICROALBUR 1.3 01/01/2018    Dg Shoulder Right  Result Date: 09/25/2018 CLINICAL DATA:  Right shoulder pain for 2 months.  No known injury. EXAM:  RIGHT SHOULDER - 2+ VIEW COMPARISON:  None. FINDINGS: Mild AC joint degenerative changes. Mild glenohumeral degenerative changes. No other acute abnormalities. IMPRESSION: Mild AC joint and glenohumeral joint degenerative changes. Electronically Signed   By: Dorise Bullion III M.D   On: 09/25/2018 08:49    Assessment & Plan:   There are no diagnoses linked to this encounter.   No orders of the defined  types were placed in this encounter.    Follow-up: No follow-ups on file.  Walker Kehr, MD

## 2019-04-22 ENCOUNTER — Other Ambulatory Visit: Payer: Self-pay

## 2019-04-22 DIAGNOSIS — Z20822 Contact with and (suspected) exposure to covid-19: Secondary | ICD-10-CM

## 2019-04-23 LAB — NOVEL CORONAVIRUS, NAA: SARS-CoV-2, NAA: NOT DETECTED

## 2019-07-12 ENCOUNTER — Other Ambulatory Visit: Payer: Self-pay | Admitting: Internal Medicine

## 2019-08-03 ENCOUNTER — Other Ambulatory Visit: Payer: Self-pay

## 2019-08-03 DIAGNOSIS — Z20822 Contact with and (suspected) exposure to covid-19: Secondary | ICD-10-CM

## 2019-08-04 LAB — NOVEL CORONAVIRUS, NAA: SARS-CoV-2, NAA: NOT DETECTED

## 2019-09-10 ENCOUNTER — Ambulatory Visit: Payer: Medicare Other | Admitting: Internal Medicine

## 2019-09-14 ENCOUNTER — Ambulatory Visit: Payer: Medicare Other | Admitting: Internal Medicine

## 2019-09-14 DIAGNOSIS — C61 Malignant neoplasm of prostate: Secondary | ICD-10-CM | POA: Diagnosis not present

## 2019-09-17 DIAGNOSIS — M21621 Bunionette of right foot: Secondary | ICD-10-CM | POA: Diagnosis not present

## 2019-09-17 DIAGNOSIS — I872 Venous insufficiency (chronic) (peripheral): Secondary | ICD-10-CM | POA: Diagnosis not present

## 2019-09-17 DIAGNOSIS — E1151 Type 2 diabetes mellitus with diabetic peripheral angiopathy without gangrene: Secondary | ICD-10-CM | POA: Diagnosis not present

## 2019-09-17 DIAGNOSIS — M21622 Bunionette of left foot: Secondary | ICD-10-CM | POA: Diagnosis not present

## 2019-09-17 DIAGNOSIS — L602 Onychogryphosis: Secondary | ICD-10-CM | POA: Diagnosis not present

## 2019-09-17 DIAGNOSIS — L84 Corns and callosities: Secondary | ICD-10-CM | POA: Diagnosis not present

## 2019-09-21 DIAGNOSIS — N5201 Erectile dysfunction due to arterial insufficiency: Secondary | ICD-10-CM | POA: Diagnosis not present

## 2019-09-21 DIAGNOSIS — R310 Gross hematuria: Secondary | ICD-10-CM | POA: Diagnosis not present

## 2019-09-21 DIAGNOSIS — C61 Malignant neoplasm of prostate: Secondary | ICD-10-CM | POA: Diagnosis not present

## 2019-09-21 DIAGNOSIS — R3915 Urgency of urination: Secondary | ICD-10-CM | POA: Diagnosis not present

## 2019-09-22 ENCOUNTER — Other Ambulatory Visit: Payer: Self-pay | Admitting: Internal Medicine

## 2019-09-22 ENCOUNTER — Encounter: Payer: Self-pay | Admitting: Internal Medicine

## 2019-09-22 ENCOUNTER — Ambulatory Visit (INDEPENDENT_AMBULATORY_CARE_PROVIDER_SITE_OTHER): Payer: Medicare PPO | Admitting: Internal Medicine

## 2019-09-22 ENCOUNTER — Other Ambulatory Visit: Payer: Self-pay

## 2019-09-22 VITALS — BP 130/72 | HR 57 | Temp 98.5°F | Ht 74.0 in | Wt 274.0 lb

## 2019-09-22 DIAGNOSIS — M545 Low back pain, unspecified: Secondary | ICD-10-CM

## 2019-09-22 DIAGNOSIS — M25511 Pain in right shoulder: Secondary | ICD-10-CM

## 2019-09-22 DIAGNOSIS — G8929 Other chronic pain: Secondary | ICD-10-CM

## 2019-09-22 DIAGNOSIS — C61 Malignant neoplasm of prostate: Secondary | ICD-10-CM

## 2019-09-22 DIAGNOSIS — I10 Essential (primary) hypertension: Secondary | ICD-10-CM | POA: Diagnosis not present

## 2019-09-22 DIAGNOSIS — I480 Paroxysmal atrial fibrillation: Secondary | ICD-10-CM | POA: Diagnosis not present

## 2019-09-22 DIAGNOSIS — E118 Type 2 diabetes mellitus with unspecified complications: Secondary | ICD-10-CM

## 2019-09-22 DIAGNOSIS — R160 Hepatomegaly, not elsewhere classified: Secondary | ICD-10-CM | POA: Diagnosis not present

## 2019-09-22 DIAGNOSIS — M25512 Pain in left shoulder: Secondary | ICD-10-CM

## 2019-09-22 DIAGNOSIS — K7689 Other specified diseases of liver: Secondary | ICD-10-CM | POA: Diagnosis not present

## 2019-09-22 DIAGNOSIS — M25519 Pain in unspecified shoulder: Secondary | ICD-10-CM | POA: Insufficient documentation

## 2019-09-22 LAB — HEMOGLOBIN A1C: Hgb A1c MFr Bld: 6.8 % — ABNORMAL HIGH (ref 4.6–6.5)

## 2019-09-22 LAB — BASIC METABOLIC PANEL
BUN: 17 mg/dL (ref 6–23)
CO2: 30 mEq/L (ref 19–32)
Calcium: 9.5 mg/dL (ref 8.4–10.5)
Chloride: 100 mEq/L (ref 96–112)
Creatinine, Ser: 1.1 mg/dL (ref 0.40–1.50)
GFR: 80.07 mL/min (ref >60.00)
Glucose, Bld: 124 mg/dL — ABNORMAL HIGH (ref 70–99)
Potassium: 3.2 mEq/L — ABNORMAL LOW (ref 3.5–5.1)
Sodium: 140 mEq/L (ref 135–145)

## 2019-09-22 MED ORDER — PRAVASTATIN SODIUM 40 MG PO TABS: ORAL_TABLET | ORAL | 3 refills | Status: DC

## 2019-09-22 MED ORDER — AMLODIPINE-OLMESARTAN 10-40 MG PO TABS: 1.0000 | ORAL_TABLET | Freq: Every day | ORAL | 3 refills | Status: DC

## 2019-09-22 MED ORDER — HYDROCHLOROTHIAZIDE 25 MG PO TABS: 25.0000 mg | ORAL_TABLET | Freq: Every day | ORAL | 3 refills | Status: DC

## 2019-09-22 MED ORDER — DICLOFENAC SODIUM 1 % EX GEL: 1.0000 "application " | Freq: Four times a day (QID) | CUTANEOUS | 3 refills | Status: DC

## 2019-09-22 MED ORDER — TAMSULOSIN HCL 0.4 MG PO CAPS: 0.4000 mg | ORAL_CAPSULE | Freq: Every morning | ORAL | 3 refills | Status: DC

## 2019-09-22 MED ORDER — PIOGLITAZONE HCL-METFORMIN HCL 15-850 MG PO TABS: ORAL_TABLET | ORAL | 3 refills | Status: DC

## 2019-09-22 MED ORDER — COLCHICINE 0.6 MG PO TABS: ORAL_TABLET | ORAL | 3 refills | Status: DC

## 2019-09-22 MED ORDER — FLUTICASONE PROPIONATE 50 MCG/ACT NA SUSP: NASAL | 3 refills | Status: AC

## 2019-09-22 MED ORDER — POTASSIUM CHLORIDE CRYS ER 20 MEQ PO TBCR: 20.0000 meq | EXTENDED_RELEASE_TABLET | Freq: Every day | ORAL | 3 refills | Status: DC

## 2019-09-22 MED ORDER — ACETAMINOPHEN-CODEINE #3 300-30 MG PO TABS: 1.0000 | ORAL_TABLET | Freq: Four times a day (QID) | ORAL | 2 refills | Status: DC | PRN

## 2019-09-22 MED ORDER — FUROSEMIDE 20 MG PO TABS: 20.0000 mg | ORAL_TABLET | Freq: Every day | ORAL | 3 refills | Status: AC | PRN

## 2019-09-22 MED ORDER — PANTOPRAZOLE SODIUM 20 MG PO TBEC: 20.0000 mg | DELAYED_RELEASE_TABLET | Freq: Two times a day (BID) | ORAL | 3 refills | Status: DC

## 2019-09-22 MED ORDER — METOPROLOL TARTRATE 50 MG PO TABS: 50.0000 mg | ORAL_TABLET | Freq: Every day | ORAL | 3 refills | Status: DC

## 2019-09-22 MED ORDER — INDOMETHACIN 50 MG PO CAPS: 50.0000 mg | ORAL_CAPSULE | Freq: Three times a day (TID) | ORAL | 3 refills | Status: DC | PRN

## 2019-09-22 NOTE — Assessment & Plan Note (Signed)
Asked for inj See procedure

## 2019-09-22 NOTE — Assessment & Plan Note (Addendum)
On Actos/Metformin labs

## 2019-09-22 NOTE — Patient Instructions (Signed)
Postprocedure instructions :    A Band-Aid should be left on for 12 hours. Injection therapy is not a cure itself. It is used in conjunction with other modalities. You can use nonsteroidal anti-inflammatories like ibuprofen , hot and cold compresses. Rest is recommended in the next 24 hours. You need to report immediately  if fever, chills or any signs of infection develop. 

## 2019-09-22 NOTE — Assessment & Plan Note (Signed)
T#3 prn  Potential benefits of a long term opioids use as well as potential risks (i.e. addiction risk, apnea etc) and complications (i.e. Somnolence, constipation and others) were explained to the patient and were aknowledged.

## 2019-09-22 NOTE — Assessment & Plan Note (Signed)
Lopressor, Azor 

## 2019-09-22 NOTE — Assessment & Plan Note (Signed)
PSA was very low

## 2019-09-22 NOTE — Assessment & Plan Note (Signed)
Repeat US

## 2019-09-22 NOTE — Progress Notes (Signed)
Subjective:  Patient ID: [name] [name], male    DOB: 1951/05/01  Age: 69 y.o. MRN: [id number]  CC: No chief complaint on file.   HPI [name] [name] presents for DM, h/o hepatic tumor C/o SOB C/o B AC joint pain  Outpatient Medications Prior to Visit  Medication Sig Dispense Refill  . acetaminophen-codeine (TYLENOL #3) 300-30 MG tablet Take 1-2 tablets by mouth every 6 (six) hours as needed for moderate pain. 60 tablet 1  . amLODipine-olmesartan (AZOR) 10-40 MG tablet Take 1 tablet by mouth daily. 90 tablet 3  . aspirin EC 81 MG tablet Take 81 mg by mouth daily.    . blood glucose meter kit and supplies KIT Dispense based on patient and insurance preference. Use up to four times daily as directed. (FOR ICD-9 250.00, 250.01). 1 each 0  . Carboxymethylcellulose Sodium (REFRESH LIQUIGEL OP) Apply 2 drops to eye as needed (for dry eyes).     Marland Kitchen diclofenac sodium (VOLTAREN) 1 % GEL Apply 2 g topically 2 (two) times daily. 1 Tube 2  . fluticasone (FLONASE) 50 MCG/ACT nasal spray USE ONE SPRAY IN EACH NOSTRIL DAILY 48 g 3  . furosemide (LASIX) 20 MG tablet Take 1-2 tablets (20-40 mg total) by mouth daily as needed for edema. 180 tablet 3  . hydrochlorothiazide (HYDRODIURIL) 25 MG tablet Take 1 tablet (25 mg total) by mouth daily. 90 tablet 3  . Iron-Vitamins (GERITOL COMPLETE PO) Take 1 tablet by mouth every morning. With Iron    . metoprolol tartrate (LOPRESSOR) 50 MG tablet Take 1 tablet (50 mg total) by mouth daily. Follow-up appt due in Oct must see provider for future refills 90 tablet 3  . pantoprazole (PROTONIX) 20 MG tablet Take 1 tablet (20 mg total) by mouth 2 (two) times daily before a meal. 180 tablet 3  . pioglitazone-metformin (ACTOPLUS MET) 15-850 MG tablet TAKE 1 TABLET BY MOUTH TWICE DAILY( EVERY TWELVE HOURS) 180 tablet 3  . pravastatin (PRAVACHOL) 40 MG tablet TAKE ONE TABLET BY MOUTH EACH EVENING 90 tablet 3  . tamsulosin (FLOMAX) 0.4 MG CAPS capsule Take 1 capsule (0.4  mg total) by mouth every morning. 90 capsule 3  . colchicine 0.6 MG tablet TAKE 1 TABLET(0.6 MG) BY MOUTH DAILY AS NEEDED FOR GOUT (Patient not taking: Reported on 09/22/2019) 90 tablet 3  . indomethacin (INDOCIN) 50 MG capsule Take 1 capsule (50 mg total) by mouth 3 (three) times daily as needed for moderate pain (gout). (Patient not taking: Reported on 09/22/2019) 60 capsule 3   No facility-administered medications prior to visit.    ROS: Review of Systems  Constitutional: Positive for fatigue and unexpected weight change. Negative for appetite change.  HENT: Negative for congestion, nosebleeds, sneezing, sore throat and trouble swallowing.   Eyes: Negative for itching and visual disturbance.  Respiratory: Negative for cough.   Cardiovascular: Negative for chest pain, palpitations and leg swelling.  Gastrointestinal: Negative for abdominal distention, blood in stool, diarrhea and nausea.  Genitourinary: Negative for frequency and hematuria.  Musculoskeletal: Positive for back pain and gait problem. Negative for joint swelling and neck pain.  Skin: Negative for rash.  Neurological: Negative for dizziness, tremors, speech difficulty and weakness.  Psychiatric/Behavioral: Negative for agitation, dysphoric mood and sleep disturbance. The patient is not nervous/anxious.     Objective:  BP 130/72 (BP Location: Left Arm, Patient Position: Sitting, Cuff Size: Large)   Pulse (!) 57   Temp 98.5 F (36.9 C) (Oral)   Ht  '6\' 2"'  (1.88 m)   Wt 274 lb (124.3 kg)   SpO2 96%   BMI 35.18 kg/m   BP Readings from Last 3 Encounters:  09/22/19 130/72  03/10/19 126/68  02/27/19 130/70    Wt Readings from Last 3 Encounters:  09/22/19 274 lb (124.3 kg)  03/10/19 271 lb (122.9 kg)  02/27/19 268 lb (121.6 kg)    Physical Exam Constitutional:      General: He is not in acute distress.    Appearance: He is well-developed.     Comments: NAD  Eyes:     Conjunctiva/sclera: Conjunctivae normal.      Pupils: Pupils are equal, round, and reactive to light.  Neck:     Thyroid: No thyromegaly.     Vascular: No JVD.  Cardiovascular:     Rate and Rhythm: Normal rate and regular rhythm.     Heart sounds: Normal heart sounds. No murmur. No friction rub. No gallop.   Pulmonary:     Effort: Pulmonary effort is normal. No respiratory distress.     Breath sounds: Normal breath sounds. No wheezing or rales.  Chest:     Chest wall: No tenderness.  Abdominal:     General: Bowel sounds are normal. There is no distension.     Palpations: Abdomen is soft. There is no mass.     Tenderness: There is no abdominal tenderness. There is no guarding or rebound.  Musculoskeletal:        General: No tenderness. Normal range of motion.     Cervical back: Normal range of motion.  Lymphadenopathy:     Cervical: No cervical adenopathy.  Skin:    General: Skin is warm and dry.     Findings: No rash.  Neurological:     Mental Status: He is alert and oriented to person, place, and time.     Cranial Nerves: No cranial nerve deficit.     Motor: No abnormal muscle tone.     Coordination: Coordination abnormal.     Gait: Gait normal.     Deep Tendon Reflexes: Reflexes are normal and symmetric.  Psychiatric:        Behavior: Behavior normal.        Thought Content: Thought content normal.        Judgment: Judgment normal.   HSM - firm B AC joints w/pain   Procedure :Joint Injection,   AC joint B   Indication: AC OA with refractory  chronic pain.   Risks including unsuccessful procedure , bleeding, infection, bruising, skin atrophy, "steroid flare-up" and others were explained to the patient in detail as well as the benefits. Informed consent was obtained and signed.   Tthe patient was placed in a comfortable position. Vertical approach was used. Skin was prepped with Betadine and alcohol  and anesthetized with a cooling spray. Then, a 3 cc syringe with a 2 inch long 24-gauge needle was used for a joint  injection.. The needle was advanced  Into the [facility] [place] space and was injected with 1 mL of 2% lidocaine and 20 mg of Depo-Medrol each AC joint .  Band-Aid was applied B.   Tolerated well. Complications: None. Good pain relief following the procedure.   Postprocedure instructions :    A Band-Aid should be left on for 12 hours. Injection therapy is not a cure itself. It is used in conjunction with other modalities. You can use nonsteroidal anti-inflammatories like ibuprofen , hot and cold compresses. Rest is recommended in the next  24 hours. You need to report immediately  if fever, chills or any signs of infection develop.   Lab Results  Component Value Date   WBC 5.0 03/10/2019   HGB 14.1 03/10/2019   HCT 42.1 03/10/2019   PLT 257.0 03/10/2019   GLUCOSE 126 (H) 03/10/2019   CHOL 179 03/10/2019   TRIG 115.0 03/10/2019   HDL 44.60 03/10/2019   LDLDIRECT 125.4 07/14/2008   LDLCALC 111 (H) 03/10/2019   ALT 14 03/10/2019   AST 15 03/10/2019   NA 143 03/10/2019   K 3.6 03/10/2019   CL 102 03/10/2019   CREATININE 1.14 03/10/2019   BUN 16 03/10/2019   CO2 32 03/10/2019   TSH 2.98 03/10/2019   PSA 0.02 (L) 03/10/2019   INR 1.14 06/23/2016   HGBA1C 7.0 (H) 03/10/2019   MICROALBUR <0.7 03/10/2019    DG Shoulder Right  Result Date: 09/25/2018 CLINICAL DATA:  Right shoulder pain for 2 months.  No known injury. EXAM: RIGHT SHOULDER - 2+ VIEW COMPARISON:  None. FINDINGS: Mild AC joint degenerative changes. Mild glenohumeral degenerative changes. No other acute abnormalities. IMPRESSION: Mild AC joint and glenohumeral joint degenerative changes. Electronically Signed   By: Dorise Bullion III M.D   On: 09/25/2018 08:49    Assessment & Plan:   There are no diagnoses linked to this encounter.   No orders of the defined types were placed in this encounter.    Follow-up: No follow-ups on file.  Walker Kehr, MD

## 2019-09-22 NOTE — Assessment & Plan Note (Signed)
US f/u

## 2019-09-29 ENCOUNTER — Ambulatory Visit
Admission: RE | Admit: 2019-09-29 | Discharge: 2019-09-29 | Disposition: A | Discharge status: Home / Self Care | Payer: Medicare PPO | Source: Ambulatory Visit | Attending: Internal Medicine | Admitting: Internal Medicine

## 2019-09-29 DIAGNOSIS — K760 Fatty (change of) liver, not elsewhere classified: Secondary | ICD-10-CM | POA: Diagnosis not present

## 2019-09-29 DIAGNOSIS — K7689 Other specified diseases of liver: Secondary | ICD-10-CM

## 2019-10-01 ENCOUNTER — Encounter: Payer: Medicare Other | Admitting: Internal Medicine

## 2019-10-01 ENCOUNTER — Ambulatory Visit: Payer: Medicare Other

## 2019-11-26 DIAGNOSIS — B351 Tinea unguium: Secondary | ICD-10-CM | POA: Diagnosis not present

## 2019-11-26 DIAGNOSIS — E1151 Type 2 diabetes mellitus with diabetic peripheral angiopathy without gangrene: Secondary | ICD-10-CM | POA: Diagnosis not present

## 2019-12-07 ENCOUNTER — Telehealth: Payer: Self-pay

## 2019-12-07 NOTE — Telephone Encounter (Deleted)
Error

## 2019-12-08 ENCOUNTER — Ambulatory Visit (INDEPENDENT_AMBULATORY_CARE_PROVIDER_SITE_OTHER): Payer: Medicare PPO | Admitting: Internal Medicine

## 2019-12-08 ENCOUNTER — Telehealth: Payer: Self-pay | Admitting: Internal Medicine

## 2019-12-08 ENCOUNTER — Other Ambulatory Visit: Payer: Self-pay

## 2019-12-08 ENCOUNTER — Encounter: Payer: Self-pay | Admitting: Internal Medicine

## 2019-12-08 VITALS — BP 130/80 | HR 66 | Temp 98.6°F | Ht 74.0 in | Wt 279.0 lb

## 2019-12-08 DIAGNOSIS — I480 Paroxysmal atrial fibrillation: Secondary | ICD-10-CM

## 2019-12-08 DIAGNOSIS — G459 Transient cerebral ischemic attack, unspecified: Secondary | ICD-10-CM

## 2019-12-08 LAB — URINALYSIS
Bilirubin Urine: NEGATIVE
Hgb urine dipstick: NEGATIVE
Ketones, ur: NEGATIVE
Leukocytes,Ua: NEGATIVE
Nitrite: NEGATIVE
Specific Gravity, Urine: 1.015 (ref 1.000–1.030)
Total Protein, Urine: NEGATIVE
Urine Glucose: NEGATIVE
Urobilinogen, UA: 0.2 (ref 0.0–1.0)
pH: 6.5 (ref 5.0–8.0)

## 2019-12-08 LAB — HEPATIC FUNCTION PANEL
ALT: 17 U/L (ref 0–53)
AST: 18 U/L (ref 0–37)
Albumin: 3.9 g/dL (ref 3.5–5.2)
Alkaline Phosphatase: 69 U/L (ref 39–117)
Bilirubin, Direct: 0.1 mg/dL (ref 0.0–0.3)
Total Bilirubin: 0.9 mg/dL (ref 0.2–1.2)
Total Protein: 7 g/dL (ref 6.0–8.3)

## 2019-12-08 LAB — BASIC METABOLIC PANEL
BUN: 21 mg/dL (ref 6–23)
CO2: 30 mEq/L (ref 19–32)
Calcium: 9.2 mg/dL (ref 8.4–10.5)
Chloride: 100 mEq/L (ref 96–112)
Creatinine, Ser: 1.16 mg/dL (ref 0.40–1.50)
GFR: 75.26 mL/min (ref >60.00)
Glucose, Bld: 88 mg/dL (ref 70–99)
Potassium: 3.4 mEq/L — ABNORMAL LOW (ref 3.5–5.1)
Sodium: 138 mEq/L (ref 135–145)

## 2019-12-08 MED ORDER — ASPIRIN EC 81 MG PO TBEC: 81.0000 mg | DELAYED_RELEASE_TABLET | Freq: Every day | ORAL | 3 refills | Status: DC

## 2019-12-08 NOTE — Progress Notes (Signed)
Subjective:  Patient ID: [name] [name], male    DOB: Sep 24, 1950  Age: 69 y.o. MRN: [id number]  CC: No chief complaint on file.   HPI RITO LECOMTE presents for "smelled ammonia" while watching games 2 d ago . It lasted x 10 min. Pee smelled like ammonia too. He had a light HA for a few min too.  No seizures   Outpatient Medications Prior to Visit  Medication Sig Dispense Refill  . acetaminophen-codeine (TYLENOL #3) 300-30 MG tablet Take 1-2 tablets by mouth every 6 (six) hours as needed for moderate pain. 60 tablet 2  . amLODipine-olmesartan (AZOR) 10-40 MG tablet Take 1 tablet by mouth daily. 90 tablet 3  . aspirin EC 81 MG tablet Take 81 mg by mouth daily.    . blood glucose meter kit and supplies KIT Dispense based on patient and insurance preference. Use up to four times daily as directed. (FOR ICD-9 250.00, 250.01). 1 each 0  . Carboxymethylcellulose Sodium (REFRESH LIQUIGEL OP) Apply 2 drops to eye as needed (for dry eyes).     . colchicine 0.6 MG tablet TAKE 1 TABLET(0.6 MG) BY MOUTH DAILY AS NEEDED FOR GOUT 90 tablet 3  . diclofenac Sodium (VOLTAREN) 1 % GEL Apply 1 application topically 4 (four) times daily. 100 g 3  . fluticasone (FLONASE) 50 MCG/ACT nasal spray USE ONE SPRAY IN EACH NOSTRIL DAILY 48 g 3  . furosemide (LASIX) 20 MG tablet Take 1-2 tablets (20-40 mg total) by mouth daily as needed for edema. 180 tablet 3  . hydrochlorothiazide (HYDRODIURIL) 25 MG tablet Take 1 tablet (25 mg total) by mouth daily. 90 tablet 3  . indomethacin (INDOCIN) 50 MG capsule Take 1 capsule (50 mg total) by mouth 3 (three) times daily as needed for moderate pain (gout). 60 capsule 3  . Iron-Vitamins (GERITOL COMPLETE PO) Take 1 tablet by mouth every morning. With Iron    . metoprolol tartrate (LOPRESSOR) 50 MG tablet Take 1 tablet (50 mg total) by mouth daily. Follow-up appt due in Oct must see provider for future refills 90 tablet 3  . pantoprazole (PROTONIX) 20 MG tablet Take 1  tablet (20 mg total) by mouth 2 (two) times daily before a meal. 180 tablet 3  . pioglitazone-metformin (ACTOPLUS MET) 15-850 MG tablet TAKE 1 TABLET BY MOUTH TWICE DAILY( EVERY TWELVE HOURS) 180 tablet 3  . potassium chloride SA (KLOR-CON) 20 MEQ tablet Take 1 tablet (20 mEq total) by mouth daily. 90 tablet 3  . pravastatin (PRAVACHOL) 40 MG tablet TAKE ONE TABLET BY MOUTH EACH EVENING 90 tablet 3  . tamsulosin (FLOMAX) 0.4 MG CAPS capsule Take 1 capsule (0.4 mg total) by mouth every morning. 90 capsule 3   No facility-administered medications prior to visit.    ROS: Review of Systems  Constitutional: Negative for appetite change, fatigue and unexpected weight change.  HENT: Negative for congestion, nosebleeds, sneezing, sore throat and trouble swallowing.   Eyes: Negative for itching and visual disturbance.  Respiratory: Negative for cough.   Cardiovascular: Negative for chest pain, palpitations and leg swelling.  Gastrointestinal: Negative for abdominal distention, blood in stool, diarrhea and nausea.  Genitourinary: Negative for frequency and hematuria.  Musculoskeletal: Negative for back pain, gait problem, joint swelling and neck pain.  Skin: Negative for rash.  Neurological: Negative for dizziness, tremors, speech difficulty and weakness.  Psychiatric/Behavioral: Negative for agitation, dysphoric mood and sleep disturbance. The patient is not nervous/anxious.     Objective:  BP 130/80 (  BP Location: Left Arm, Patient Position: Sitting, Cuff Size: Large)   Pulse 66   Temp 98.6 F (37 C) (Oral)   Ht '6\' 2"'  (1.88 m)   Wt 279 lb (126.6 kg)   SpO2 98%   BMI 35.82 kg/m   BP Readings from Last 3 Encounters:  12/08/19 130/80  09/22/19 130/72  03/10/19 126/68    Wt Readings from Last 3 Encounters:  12/08/19 279 lb (126.6 kg)  09/22/19 274 lb (124.3 kg)  03/10/19 271 lb (122.9 kg)    Physical Exam Constitutional:      General: He is not in acute distress.    Appearance:  He is well-developed.     Comments: NAD  Eyes:     Conjunctiva/sclera: Conjunctivae normal.     Pupils: Pupils are equal, round, and reactive to light.  Neck:     Thyroid: No thyromegaly.     Vascular: No JVD.  Cardiovascular:     Rate and Rhythm: Normal rate and regular rhythm.     Heart sounds: Normal heart sounds. No murmur. No friction rub. No gallop.   Pulmonary:     Effort: Pulmonary effort is normal. No respiratory distress.     Breath sounds: Normal breath sounds. No wheezing or rales.  Chest:     Chest wall: No tenderness.  Abdominal:     General: Bowel sounds are normal. There is no distension.     Palpations: Abdomen is soft. There is no mass.     Tenderness: There is no abdominal tenderness. There is no guarding or rebound.  Musculoskeletal:        General: No tenderness. Normal range of motion.     Cervical back: Normal range of motion.  Lymphadenopathy:     Cervical: No cervical adenopathy.  Skin:    General: Skin is warm and dry.     Findings: No rash.  Neurological:     Mental Status: He is alert and oriented to person, place, and time.     Cranial Nerves: No cranial nerve deficit.     Motor: No abnormal muscle tone.     Coordination: Coordination normal.     Gait: Gait normal.     Deep Tendon Reflexes: Reflexes are normal and symmetric.  Psychiatric:        Behavior: Behavior normal.        Thought Content: Thought content normal.        Judgment: Judgment normal.     Lab Results  Component Value Date   WBC 5.0 03/10/2019   HGB 14.1 03/10/2019   HCT 42.1 03/10/2019   PLT 257.0 03/10/2019   GLUCOSE 124 (H) 09/22/2019   CHOL 179 03/10/2019   TRIG 115.0 03/10/2019   HDL 44.60 03/10/2019   LDLDIRECT 125.4 07/14/2008   LDLCALC 111 (H) 03/10/2019   ALT 14 03/10/2019   AST 15 03/10/2019   NA 140 09/22/2019   K 3.2 (L) 09/22/2019   CL 100 09/22/2019   CREATININE 1.10 09/22/2019   BUN 17 09/22/2019   CO2 30 09/22/2019   TSH 2.98 03/10/2019   PSA  0.02 (L) 03/10/2019   INR 1.14 06/23/2016   HGBA1C 6.8 (H) 09/22/2019   MICROALBUR <0.7 03/10/2019    US Abdomen Limited RUQ  Result Date: 09/29/2019 CLINICAL DATA:  History of liver cysts. Patient status post resection of a benign tumor from the liver in 2014. EXAM: ULTRASOUND ABDOMEN LIMITED RIGHT UPPER QUADRANT COMPARISON:  CT abdomen and pelvis 01/25/2015. Right upper quadrant  ultrasound 01/14/2018. FINDINGS: Gallbladder: No gallstones or wall thickening visualized. No sonographic Murphy sign noted by sonographer. Common bile duct: Diameter: 0.5 cm. Liver: The liver appears dense with increased echogenicity. Two cysts are identified measuring 1.41.5 cm. Portal vein is patent on color Doppler imaging with normal direction of blood flow towards the liver. Other: None. IMPRESSION: No acute abnormality. Two cysts in the liver. The patient had several small cysts on the prior CT. Fatty infiltration of the liver. Electronically Signed   By: Inge Rise M.D.   On: 09/29/2019 14:51    Assessment & Plan:   There are no diagnoses linked to this encounter.   No orders of the defined types were placed in this encounter.    Follow-up: No follow-ups on file.  Walker Kehr, MD

## 2019-12-08 NOTE — Assessment & Plan Note (Signed)
The pt "smelled ammonia" while watching games 2 d ago . It lasted x 10 min. Pee smelled like ammonia too. He had a light HA for a few min too. TIA vs migraine vs other CT brain Carotid Doppler UA, labs

## 2019-12-08 NOTE — Telephone Encounter (Signed)
New message:   Pt is calling and states on his AVS it states he is being treated for a TIA but states he came in for a possible UTI. Please advise.

## 2019-12-09 NOTE — Telephone Encounter (Signed)
His c/o "smelling ammonia" could be a TIA symptom. No UTI -- UA was normal. Thx

## 2019-12-10 NOTE — Telephone Encounter (Signed)
Called pt there was no answer left MD msg on pt vm.Marland KitchenJohny [name]

## 2019-12-11 NOTE — Addendum Note (Signed)
Addended by: Karren Cobble on: 12/11/2019 02:28 PM   Modules accepted: Orders

## 2019-12-14 ENCOUNTER — Inpatient Hospital Stay: Admission: RE | Admit: 2019-12-14 | Payer: Medicare PPO | Source: Ambulatory Visit

## 2019-12-17 ENCOUNTER — Other Ambulatory Visit: Payer: Self-pay

## 2019-12-17 ENCOUNTER — Ambulatory Visit (INDEPENDENT_AMBULATORY_CARE_PROVIDER_SITE_OTHER)
Admission: RE | Admit: 2019-12-17 | Discharge: 2019-12-17 | Disposition: A | Discharge status: Home / Self Care | Payer: Medicare PPO | Source: Ambulatory Visit | Attending: Internal Medicine | Admitting: Internal Medicine

## 2019-12-17 ENCOUNTER — Ambulatory Visit (HOSPITAL_COMMUNITY)
Admission: RE | Admit: 2019-12-17 | Discharge: 2019-12-17 | Disposition: A | Discharge status: Home / Self Care | Payer: Medicare PPO | Source: Ambulatory Visit | Attending: Cardiology | Admitting: Cardiology

## 2019-12-17 DIAGNOSIS — G459 Transient cerebral ischemic attack, unspecified: Secondary | ICD-10-CM

## 2020-02-24 DIAGNOSIS — B351 Tinea unguium: Secondary | ICD-10-CM | POA: Diagnosis not present

## 2020-03-10 ENCOUNTER — Ambulatory Visit (INDEPENDENT_AMBULATORY_CARE_PROVIDER_SITE_OTHER): Payer: Medicare PPO | Admitting: Internal Medicine

## 2020-03-10 ENCOUNTER — Other Ambulatory Visit: Payer: Self-pay

## 2020-03-10 ENCOUNTER — Encounter: Payer: Self-pay | Admitting: Internal Medicine

## 2020-03-10 ENCOUNTER — Ambulatory Visit (INDEPENDENT_AMBULATORY_CARE_PROVIDER_SITE_OTHER): Payer: Medicare PPO

## 2020-03-10 VITALS — BP 130/62 | HR 70 | Temp 98.7°F | Ht 74.0 in | Wt 271.4 lb

## 2020-03-10 DIAGNOSIS — I480 Paroxysmal atrial fibrillation: Secondary | ICD-10-CM

## 2020-03-10 DIAGNOSIS — M545 Low back pain, unspecified: Secondary | ICD-10-CM

## 2020-03-10 DIAGNOSIS — J90 Pleural effusion, not elsewhere classified: Secondary | ICD-10-CM | POA: Diagnosis not present

## 2020-03-10 DIAGNOSIS — E118 Type 2 diabetes mellitus with unspecified complications: Secondary | ICD-10-CM | POA: Diagnosis not present

## 2020-03-10 DIAGNOSIS — I10 Essential (primary) hypertension: Secondary | ICD-10-CM

## 2020-03-10 DIAGNOSIS — G8929 Other chronic pain: Secondary | ICD-10-CM

## 2020-03-10 DIAGNOSIS — Z0000 Encounter for general adult medical examination without abnormal findings: Secondary | ICD-10-CM | POA: Diagnosis not present

## 2020-03-10 DIAGNOSIS — Z0189 Encounter for other specified special examinations: Secondary | ICD-10-CM | POA: Diagnosis not present

## 2020-03-10 MED ORDER — DICLOFENAC SODIUM 1 % EX GEL: 1.0000 "application " | Freq: Four times a day (QID) | CUTANEOUS | 3 refills | Status: DC

## 2020-03-10 NOTE — Assessment & Plan Note (Signed)
Lopressor, Azor

## 2020-03-10 NOTE — Assessment & Plan Note (Signed)
A1c Pt lost wt 

## 2020-03-10 NOTE — Assessment & Plan Note (Signed)
We discussed age appropriate health related issues, including available/recomended screening tests and vaccinations. We discussed a need for adhering to healthy diet and exercise. Labs were ordered. All questions were answered.

## 2020-03-10 NOTE — Assessment & Plan Note (Signed)
T#3 prn - rare use  Potential benefits of a long term opioids use as well as potential risks (i.e. addiction risk, apnea etc) and complications (i.e. Somnolence, constipation and others) were explained to the patient and were aknowledged

## 2020-03-10 NOTE — Progress Notes (Signed)
Subjective:  Patient ID: [name] [name], male    DOB: [birth year]  Age: 69 y.o. MRN: [id number]  CC: Annual Exam (Req refill on his Voltaren gel)   HPI [name] [name] presents for a well exam  Outpatient Medications Prior to Visit  Medication Sig Dispense Refill  . acetaminophen-codeine (TYLENOL #3) 300-30 MG tablet Take 1-2 tablets by mouth every 6 (six) hours as needed for moderate pain. 60 tablet 2  . amLODipine-olmesartan (AZOR) 10-40 MG tablet Take 1 tablet by mouth daily. 90 tablet 3  . aspirin EC 81 MG tablet Take 1 tablet (81 mg total) by mouth daily. 100 tablet 3  . blood glucose meter kit and supplies KIT Dispense based on patient and insurance preference. Use up to four times daily as directed. (FOR ICD-9 250.00, 250.01). 1 each 0  . Carboxymethylcellulose Sodium (REFRESH LIQUIGEL OP) Apply 2 drops to eye as needed (for dry eyes).     . colchicine 0.6 MG tablet TAKE 1 TABLET(0.6 MG) BY MOUTH DAILY AS NEEDED FOR GOUT 90 tablet 3  . fluticasone (FLONASE) 50 MCG/ACT nasal spray USE ONE SPRAY IN EACH NOSTRIL DAILY 48 g 3  . furosemide (LASIX) 20 MG tablet Take 1-2 tablets (20-40 mg total) by mouth daily as needed for edema. 180 tablet 3  . hydrochlorothiazide (HYDRODIURIL) 25 MG tablet Take 1 tablet (25 mg total) by mouth daily. 90 tablet 3  . indomethacin (INDOCIN) 50 MG capsule Take 1 capsule (50 mg total) by mouth 3 (three) times daily as needed for moderate pain (gout). 60 capsule 3  . Iron-Vitamins (GERITOL COMPLETE PO) Take 1 tablet by mouth every morning. With Iron    . metoprolol tartrate (LOPRESSOR) 50 MG tablet Take 1 tablet (50 mg total) by mouth daily. Follow-up appt due in Oct must see provider for future refills 90 tablet 3  . MITIGARE 0.6 MG CAPS     . pantoprazole (PROTONIX) 20 MG tablet Take 1 tablet (20 mg total) by mouth 2 (two) times daily before a meal. 180 tablet 3  . pioglitazone-metformin (ACTOPLUS MET) 15-850 MG tablet TAKE 1 TABLET BY MOUTH TWICE  DAILY( EVERY TWELVE HOURS) 180 tablet 3  . potassium chloride SA (KLOR-CON) 20 MEQ tablet Take 1 tablet (20 mEq total) by mouth daily. 90 tablet 3  . pravastatin (PRAVACHOL) 40 MG tablet TAKE ONE TABLET BY MOUTH EACH EVENING 90 tablet 3  . tamsulosin (FLOMAX) 0.4 MG CAPS capsule Take 1 capsule (0.4 mg total) by mouth every morning. 90 capsule 3  . diclofenac Sodium (VOLTAREN) 1 % GEL Apply 1 application topically 4 (four) times daily. 100 g 3   No facility-administered medications prior to visit.    ROS: Review of Systems  Constitutional: Negative for appetite change, fatigue and unexpected weight change.  HENT: Negative for congestion, nosebleeds, sneezing, sore throat and trouble swallowing.   Eyes: Negative for itching and visual disturbance.  Respiratory: Negative for cough.   Cardiovascular: Negative for chest pain, palpitations and leg swelling.  Gastrointestinal: Negative for abdominal distention, blood in stool, diarrhea and nausea.  Genitourinary: Negative for frequency and hematuria.  Musculoskeletal: Negative for back pain, gait problem, joint swelling and neck pain.  Skin: Negative for rash.  Neurological: Negative for dizziness, tremors, speech difficulty and weakness.  Psychiatric/Behavioral: Negative for agitation, dysphoric mood and sleep disturbance. The patient is not nervous/anxious.     Objective:  BP 130/62 (BP Location: Left Arm)   Pulse 70   Temp 98.7 F (37.1  C) (Oral)   Ht _0  (1.88 m)   Wt 271 lb 6.4 oz (123.1 kg)   SpO2 96%   BMI 34.85 kg/m   BP Readings from Last 3 Encounters:  03/10/20 130/62  12/08/19 130/80  09/22/19 130/72    Wt Readings from Last 3 Encounters:  03/10/20 271 lb 6.4 oz (123.1 kg)  12/08/19 279 lb (126.6 kg)  09/22/19 274 lb (124.3 kg)    Physical Exam Constitutional:      General: He is not in acute distress.    Appearance: He is well-developed. He is obese.     Comments: NAD  Eyes:     Conjunctiva/sclera:  Conjunctivae normal.     Pupils: Pupils are equal, round, and reactive to light.  Neck:     Thyroid: No thyromegaly.     Vascular: No JVD.  Cardiovascular:     Rate and Rhythm: Normal rate and regular rhythm.     Heart sounds: Normal heart sounds. No murmur heard.  No friction rub. No gallop.   Pulmonary:     Effort: Pulmonary effort is normal. No respiratory distress.     Breath sounds: Normal breath sounds. No wheezing or rales.  Chest:     Chest wall: No tenderness.  Abdominal:     General: Bowel sounds are normal. There is no distension.     Palpations: Abdomen is soft. There is no mass.     Tenderness: There is no abdominal tenderness. There is no guarding or rebound.  Musculoskeletal:        General: No tenderness. Normal range of motion.     Cervical back: Normal range of motion.  Lymphadenopathy:     Cervical: No cervical adenopathy.  Skin:    General: Skin is warm and dry.     Findings: No rash.  Neurological:     Mental Status: He is alert and oriented to person, place, and time.     Cranial Nerves: No cranial nerve deficit.     Motor: No abnormal muscle tone.     Coordination: Coordination normal.     Gait: Gait normal.     Deep Tendon Reflexes: Reflexes are normal and symmetric.  Psychiatric:        Behavior: Behavior normal.        Thought Content: Thought content normal.        Judgment: Judgment normal.   liver mass - no change Scars on abd  Lab Results  Component Value Date   WBC 5.0 03/10/2019   HGB 14.1 03/10/2019   HCT 42.1 03/10/2019   PLT 257.0 03/10/2019   GLUCOSE 88 12/08/2019   CHOL 179 03/10/2019   TRIG 115.0 03/10/2019   HDL 44.60 03/10/2019   LDLDIRECT 125.4 07/14/2008   LDLCALC 111 (H) 03/10/2019   ALT 17 12/08/2019   AST 18 12/08/2019   NA 138 12/08/2019   K 3.4 (L) 12/08/2019   CL 100 12/08/2019   CREATININE 1.16 12/08/2019   BUN 21 12/08/2019   CO2 30 12/08/2019   TSH 2.98 03/10/2019   PSA 0.02 (L) 03/10/2019   INR 1.14  06/23/2016   HGBA1C 6.8 (H) 09/22/2019   MICROALBUR <0.7 03/10/2019    CT Head Wo Contrast  Result Date: 12/17/2019 CLINICAL DATA:  TIA several episodes of smelling ammonia in the house a few days ago, headache, history prostate cancer, COPD, hypertension, rectal cancer at EXAM: CT HEAD WITHOUT CONTRAST TECHNIQUE: Contiguous axial images were obtained from the base of the skull  through the vertex without intravenous contrast. Sagittal and coronal MPR images reconstructed from axial data set. COMPARISON:  None FINDINGS: Brain: Mild generalized atrophy. Normal ventricular morphology. No midline shift or mass effect. Otherwise normal appearance of brain parenchyma. No intracranial hemorrhage, mass lesion or evidence of acute infarction. No extra-axial fluid collections. Vascular: No hyperdense vessels. Skull: Intact Sinuses/Orbits: Clear Other: N/A IMPRESSION: No acute intracranial abnormalities. Electronically Signed   By: Lavonia Dana M.D.   On: 12/17/2019 14:55   VAS US CAROTID  Result Date: 12/18/2019 Carotid Arterial Duplex Study Indications:       Patient reports having a strong ammonia like smell lasting                    about 1-2 minutes on 11/29/2019. After the smell dissipated                    he went to use the rest room and the smell occurred again,                    but it was less intense lasting about a minute. 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 Since then he has not had any issues and he denies                    any other cerebrovascular symptoms. Risk Factors:      Hypertension, hyperlipidemia, Diabetes, past history of                    smoking. Other Factors:     TIA, COPD. Comparison Study:  NA Performing Technologist: Sharlett Iles RVT  Examination Guidelines: A complete evaluation includes B-mode imaging, spectral Doppler, color  Doppler, and power Doppler as needed of all accessible portions of each vessel. Bilateral testing is considered an integral part of a complete examination. Limited examinations for reoccurring indications may be performed as noted.  Right Carotid Findings: +----------+--------+--------+--------+------------------+------------------+           PSV cm/sEDV cm/sStenosisPlaque DescriptionComments           +----------+--------+--------+--------+------------------+------------------+ CCA Prox  99      11                                                   +----------+--------+--------+--------+------------------+------------------+ CCA Distal80      13                                intimal thickening +----------+--------+--------+--------+------------------+------------------+ ICA Prox  73      17              heterogenous      intimal thickening +----------+--------+--------+--------+------------------+------------------+ ICA Mid   82      25      1-39%                                        +----------+--------+--------+--------+------------------+------------------+  ICA Distal108     31                                                   +----------+--------+--------+--------+------------------+------------------+ ECA       102     8               heterogenous      intimal thickening +----------+--------+--------+--------+------------------+------------------+ +----------+--------+-------+----------------+-------------------+           PSV cm/sEDV cmsDescribe        Arm Pressure (mmHG) +----------+--------+-------+----------------+-------------------+ [id number]            Multiphasic, [id number]                 +----------+--------+-------+----------------+-------------------+ +---------+--------+--+--------+--+---------+ VertebralPSV cm/s55EDV cm/s14Antegrade +---------+--------+--+--------+--+---------+  Left Carotid Findings:  +----------+--------+--------+--------+------------------+------------------+           PSV cm/sEDV cm/sStenosisPlaque DescriptionComments           +----------+--------+--------+--------+------------------+------------------+ CCA Prox  106     14                                                   +----------+--------+--------+--------+------------------+------------------+ CCA Distal50      9                                 intimal thickening +----------+--------+--------+--------+------------------+------------------+ ICA Prox  52      15              heterogenous                         +----------+--------+--------+--------+------------------+------------------+ ICA Mid   80      27      1-39%                                        +----------+--------+--------+--------+------------------+------------------+ ICA Distal91      31                                                   +----------+--------+--------+--------+------------------+------------------+ ECA       83      5                                                    +----------+--------+--------+--------+------------------+------------------+ +----------+--------+--------+----------------+-------------------+           PSV cm/sEDV cm/sDescribe        Arm Pressure (mmHG) +----------+--------+--------+----------------+-------------------+ [id number]             Multiphasic, [id number]                 +----------+--------+--------+----------------+-------------------+ +---------+--------+--+--------+--+---------+ VertebralPSV cm/s40EDV cm/s12Antegrade +---------+--------+--+--------+--+---------+   Summary: Right Carotid: Velocities in the right ICA are consistent with a 1-39% stenosis. Left Carotid:  Velocities in the left ICA are consistent with a 1-39% stenosis. Vertebrals:  Bilateral vertebral arteries demonstrate antegrade flow. Subclavians: Normal flow hemodynamics were seen in bilateral  subclavian              arteries. *See table(s) above for measurements and observations.  Electronically signed by Kathlyn Sacramento MD on 12/18/2019 at 1:26:11 PM.    Final     Assessment & Plan:   There are no diagnoses linked to this encounter.   Meds ordered this encounter  Medications  . diclofenac Sodium (VOLTAREN) 1 % GEL    Sig: Apply 1 application topically 4 (four) times daily.    Dispense:  100 g    Refill:  3     Follow-up: No follow-ups on file.  Walker Kehr, MD

## 2020-03-11 LAB — HEPATIC FUNCTION PANEL
AG Ratio: 1.3 (calc) (ref 1.0–2.5)
ALT: 18 U/L (ref 9–46)
AST: 18 U/L (ref 10–35)
Albumin: 4 g/dL (ref 3.6–5.1)
Alkaline phosphatase (APISO): 73 U/L (ref 35–144)
Bilirubin, Direct: 0.2 mg/dL (ref 0.0–0.2)
Globulin: 3 g/dL (calc) (ref 1.9–3.7)
Indirect Bilirubin: 0.7 mg/dL (calc) (ref 0.2–1.2)
Total Bilirubin: 0.9 mg/dL (ref 0.2–1.2)
Total Protein: 7 g/dL (ref 6.1–8.1)

## 2020-03-11 LAB — HEMOGLOBIN A1C
Hgb A1c MFr Bld: 6.7 % of total Hgb — ABNORMAL HIGH (ref <5.7)
Mean Plasma Glucose: 146 (calc)
eAG (mmol/L): 8.1 (calc)

## 2020-03-11 LAB — CBC WITH DIFFERENTIAL/PLATELET
Absolute Monocytes: 460 cells/uL (ref 200–950)
Basophils Absolute: 32 cells/uL (ref 0–200)
Basophils Relative: 0.7 %
Eosinophils Absolute: 101 cells/uL (ref 15–500)
Eosinophils Relative: 2.2 %
HCT: 44 % (ref 38.5–50.0)
Hemoglobin: 15.1 g/dL (ref 13.2–17.1)
Lymphs Abs: 1224 cells/uL (ref 850–3900)
MCH: 30.5 pg (ref 27.0–33.0)
MCHC: 34.3 g/dL (ref 32.0–36.0)
MCV: 88.9 fL (ref 80.0–100.0)
MPV: 10.4 fL (ref 7.5–12.5)
Monocytes Relative: 10 %
Neutro Abs: 2783 cells/uL (ref 1500–7800)
Neutrophils Relative %: 60.5 %
Platelets: 304 10*3/uL (ref 140–400)
RBC: 4.95 10*6/uL (ref 4.20–5.80)
RDW: 13.2 % (ref 11.0–15.0)
Total Lymphocyte: 26.6 %
WBC: 4.6 10*3/uL (ref 3.8–10.8)

## 2020-03-11 LAB — BASIC METABOLIC PANEL
BUN: 20 mg/dL (ref 7–25)
CO2: 29 mmol/L (ref 20–32)
Calcium: 9.5 mg/dL (ref 8.6–10.3)
Chloride: 106 mmol/L (ref 98–110)
Creat: 1.05 mg/dL (ref 0.70–1.18)
Glucose, Bld: 128 mg/dL — ABNORMAL HIGH (ref 65–99)
Potassium: 3.7 mmol/L (ref 3.5–5.3)
Sodium: 140 mmol/L (ref 135–146)

## 2020-03-11 LAB — MICROALBUMIN / CREATININE URINE RATIO
Creatinine, Urine: 101 mg/dL (ref 20–320)
Microalb Creat Ratio: 3 mcg/mg creat (ref <30)
Microalb, Ur: 0.3 mg/dL

## 2020-03-11 LAB — URINALYSIS
Bilirubin Urine: NEGATIVE
Glucose, UA: NEGATIVE
Hgb urine dipstick: NEGATIVE
Ketones, ur: NEGATIVE
Leukocytes,Ua: NEGATIVE
Nitrite: NEGATIVE
Protein, ur: NEGATIVE
Specific Gravity, Urine: 1.017 (ref 1.001–1.03)
pH: 7 (ref 5.0–8.0)

## 2020-03-11 LAB — LIPID PANEL
Cholesterol: 195 mg/dL (ref <200)
HDL: 49 mg/dL (ref >=40)
LDL Cholesterol (Calc): 126 mg/dL (calc) — ABNORMAL HIGH
Non-HDL Cholesterol (Calc): 146 mg/dL (calc) — ABNORMAL HIGH (ref <130)
Total CHOL/HDL Ratio: 4 (calc) (ref <5.0)
Triglycerides: 95 mg/dL (ref <150)

## 2020-03-11 LAB — PSA: PSA: <0.1 ng/mL (ref <=4.0)

## 2020-03-11 LAB — TSH: TSH: 2.35 mIU/L (ref 0.40–4.50)

## 2020-03-22 ENCOUNTER — Other Ambulatory Visit: Payer: Self-pay

## 2020-03-22 ENCOUNTER — Encounter (HOSPITAL_COMMUNITY): Payer: Self-pay | Admitting: Emergency Medicine

## 2020-03-22 ENCOUNTER — Ambulatory Visit (HOSPITAL_COMMUNITY)
Admission: EM | Admit: 2020-03-22 | Discharge: 2020-03-22 | Disposition: A | Discharge status: Home / Self Care | Payer: Medicare PPO | Attending: Family Medicine | Admitting: Family Medicine

## 2020-03-22 DIAGNOSIS — J0100 Acute maxillary sinusitis, unspecified: Secondary | ICD-10-CM

## 2020-03-22 MED ORDER — CETIRIZINE HCL 10 MG PO TABS
10.0000 mg | ORAL_TABLET | Freq: Every day | ORAL | 0 refills | Status: DC
Start: 2020-03-22 — End: 2021-12-12

## 2020-03-22 NOTE — ED Provider Notes (Signed)
[facility]    CSN: [id number] Arrival date & time: 03/22/20  1020      History   Chief Complaint Chief Complaint  Patient presents with  . Nasal Congestion    HPI [name] [name] is a 69 y.o. male.   Pt presents with sinus congestion beginning Saturday. Clear nasal drainage. States coughing when postnasal drip irritates throat. Takes flonase 3 times a week. Denies sinus pressure/pain, ear pain, chest pain, shortness of breath, fevers/chills, fatigue. Reports hx sinus infections.   ROS per HPI      Past Medical History:  Diagnosis Date  . Allergy   . Bilateral renal cysts    benign/ Patient denies  . COPD (chronic obstructive pulmonary disease) ([place])     PFT 2010 due to previous 20y h/o smoking  . DDD (degenerative disc disease), lumbar   . Diabetes mellitus type II   . ED (erectile dysfunction) of organic origin   . First degree heart block   . History of colon polyps   . History of prostate cancer current PSA per pt 0.13   dx March 2014---  s/p radiactive prostate seed implants and intraop radiotherapy in [place], [place]  . Hyperlipidemia   . Hypertension   . Liver mass 06/2013   OVER HALF OF LIVER REMOVED/ BENIGN  . OSA on CPAP   . Osteoarthritis   . PAF (paroxysmal atrial fibrillation) [facility])    cardiologist--  dr hochrein  (episode prior to liver surgery at [facility] 2014 w/ post-op RVR)  . Prostate cancer ([place]) 11-11-2012   s/p chemo, radiation   . Rectal cancer ([place])    01-25-2015----per pathology --  complete rectal polypectomy-- showed  invasive carcinoma rising in tubular adnemoa high grade hyperplasia--  per dr Ardis Hughs per MRI  no other area seen -- will have colonoscopy done in 3 months  . Sleep apnea    wears cpap  . Ulcer    STOMACH ULCER  . Wears glasses     Patient Active Problem List   Diagnosis Date Noted  . TIA (transient ischemic attack) 12/08/2019  . AC joint pain 09/22/2019  . Biceps tendinitis 09/25/2018  . Melena  06/22/2016  . Fatigue 08/10/2015  . Erectile dysfunction 04/25/2015  . Anemia due to chronic blood loss 12/21/2014  . GI bleed 12/21/2014  . Chest pain at rest 09/30/2014  . Edema 07/13/2014  . Allergic rhinitis 03/17/2014  . PAF (paroxysmal atrial fibrillation) ([place]) 05/25/2013  . Cardiomegaly 12/12/2012  . Liver mass 12/12/2012  . Prostate cancer ([place]) 12/11/2012  . Hepatic cyst 12/11/2012  . Hematuria, gross 09/05/2012  . Knee osteoarthritis 09/05/2012  . OSA on CPAP 09/05/2012  . Snoring 09/07/2011  . Paresthesia of left arm 07/06/2011  . Gout attack 03/13/2011  . Well adult exam 03/03/2011  . Arthritis of knee 03/03/2011  . MENTAL CONFUSION 03/09/2010  . GAIT DISTURBANCE 03/09/2010  . HEAT STROKE 03/09/2010  . FOOT PAIN 11/08/2009  . CHEST PAIN UNSPECIFIED 08/19/2009  . COPD (chronic obstructive pulmonary disease) ([place]) 07/08/2009  . TOBACCO USE, QUIT 07/08/2009  . RECTAL BLEEDING 12/07/2008  . PERSONAL HISTORY OF COLONIC POLYPS 12/07/2008  . Dyslipidemia 03/02/2008  . HEMATOCHEZIA 03/02/2008  . COUGH 03/02/2008  . HYPOKALEMIA 02/03/2008  . Acute sinusitis 02/03/2008  . SYNCOPE 02/03/2008  . DM (diabetes mellitus), type 2 with complications ([place]) 19/50/9326  . Shoulder pain 11/27/2007  . ERECTILE DYSFUNCTION 10/30/2007  . Essential hypertension 10/30/2007  . OSTEOARTHRITIS 10/30/2007  . LOW  BACK PAIN 10/30/2007    Past Surgical History:  Procedure Laterality Date  .  RIGHT LOBE HEPATECTOMY  06-12-2013    [facility]   benign mass  . CARDIOVASCULAR STRESS TEST  10-01-2014   dr hochrein   normal perfusion study, no ischemia,  normal LV function and wall motion, ef 70%  . COLONOSCOPY    . ESOPHAGOGASTRODUODENOSCOPY N/A 06/23/2016   Procedure: ESOPHAGOGASTRODUODENOSCOPY (EGD);  Surgeon: Manus Gunning, MD;  Location: [facility];  Service: Gastroenterology;  Laterality: N/A;  . EUS N/A 02/10/2015   Procedure: LOWER ENDOSCOPIC ULTRASOUND (EUS);  Surgeon:  Milus Banister, MD;  Location: [facility];  Service: Endoscopy;  Laterality: N/A;  . KNEE ARTHROSCOPY Bilateral 1990's  . LUMBAR FUSION  2001  . PENILE PROSTHESIS IMPLANT N/A 04/25/2015   Procedure: PENILE PROSTHESIS THREE PIECE INFLATABLE( COLOPLAST) SCROTAL APPROACH;  Surgeon: Kathie Rhodes, MD;  Location: [facility];  Service: Urology;  Laterality: N/A;  . POLYPECTOMY    . RADIOACTIVE PROSTATE SEED IMPLANTS  May 2014  in [place], [place] Right 02/21/2016  . TOTAL KNEE ARTHROPLASTY Left 10- 2013  . TRANSTHORACIC ECHOCARDIOGRAM  12-26-2012   mild focal basal septal hypertrophy,  grade II diastolic dysfunction,  ef 55-60%,  mild LAE,  trivial MR and TR  . UPPER GASTROINTESTINAL ENDOSCOPY    . VASECTOMY  1980's w/ gen. anes.       Home Medications    Prior to Admission medications   Medication Sig Start Date End Date Taking? Authorizing Provider  acetaminophen-codeine (TYLENOL #3) 300-30 MG tablet Take 1-2 tablets by mouth every 6 (six) hours as needed for moderate pain. 09/22/19   Plotnikov, Evie Lacks, MD  amLODipine-olmesartan (AZOR) 10-40 MG tablet Take 1 tablet by mouth daily. 09/22/19   Plotnikov, Evie Lacks, MD  aspirin EC 81 MG tablet Take 1 tablet (81 mg total) by mouth daily. 12/08/19   Plotnikov, Evie Lacks, MD  blood glucose meter kit and supplies KIT Dispense based on patient and insurance preference. Use up to four times daily as directed. (FOR ICD-9 250.00, 250.01). 08/23/17   Plotnikov, Evie Lacks, MD  Carboxymethylcellulose Sodium (REFRESH LIQUIGEL OP) Apply 2 drops to eye as needed (for dry eyes).     [provider]  cetirizine (ZYRTEC) 10 MG tablet Take 1 tablet (10 mg total) by mouth daily. 03/22/20   Leilene Diprima, Tressia Miners A, NP  colchicine 0.6 MG tablet TAKE 1 TABLET(0.6 MG) BY MOUTH DAILY AS NEEDED FOR GOUT 09/22/19   Plotnikov, Evie Lacks, MD  diclofenac Sodium (VOLTAREN) 1 % GEL Apply 1 application topically 4 (four) times daily. 03/10/20    Plotnikov, Evie Lacks, MD  fluticasone (FLONASE) 50 MCG/ACT nasal spray USE ONE SPRAY IN EACH NOSTRIL DAILY 09/22/19   Plotnikov, Evie Lacks, MD  furosemide (LASIX) 20 MG tablet Take 1-2 tablets (20-40 mg total) by mouth daily as needed for edema. 09/22/19   Plotnikov, Evie Lacks, MD  hydrochlorothiazide (HYDRODIURIL) 25 MG tablet Take 1 tablet (25 mg total) by mouth daily. 09/22/19   Plotnikov, Evie Lacks, MD  indomethacin (INDOCIN) 50 MG capsule Take 1 capsule (50 mg total) by mouth 3 (three) times daily as needed for moderate pain (gout). 09/22/19   Plotnikov, Evie Lacks, MD  Iron-Vitamins (GERITOL COMPLETE PO) Take 1 tablet by mouth every morning. With Iron    [provider]  metoprolol tartrate (LOPRESSOR) 50 MG tablet Take 1 tablet (50 mg total) by mouth daily. Follow-up appt due  in Oct must see provider for future refills 09/22/19   Plotnikov, Evie Lacks, MD  MITIGARE 0.6 MG CAPS  03/04/20   [provider]  pantoprazole (PROTONIX) 20 MG tablet Take 1 tablet (20 mg total) by mouth 2 (two) times daily before a meal. 09/22/19   Plotnikov, Evie Lacks, MD  pioglitazone-metformin (ACTOPLUS MET) 15-850 MG tablet TAKE 1 TABLET BY MOUTH TWICE DAILY( EVERY TWELVE HOURS) 09/22/19   Plotnikov, Evie Lacks, MD  potassium chloride SA (KLOR-CON) 20 MEQ tablet Take 1 tablet (20 mEq total) by mouth daily. 09/22/19   Plotnikov, Evie Lacks, MD  pravastatin (PRAVACHOL) 40 MG tablet TAKE ONE TABLET BY MOUTH EACH EVENING 09/22/19   Plotnikov, Evie Lacks, MD  tamsulosin (FLOMAX) 0.4 MG CAPS capsule Take 1 capsule (0.4 mg total) by mouth every morning. 09/22/19   Plotnikov, Evie Lacks, MD    Family History Family History  Problem Relation Age of Onset  . Diabetes Mother   . Heart disease Mother        Pacemaker  . Pancreatic cancer Father   . Stomach cancer Father   . Stomach cancer Sister   . Pancreatic cancer Sister   . Hypertension Other   . Colon polyps Neg Hx   . Rectal cancer Neg Hx   . Esophageal  cancer Neg Hx   . Colon cancer Neg Hx     Social History Social History   Tobacco Use  . Smoking status: Former Smoker    Packs/day: 2.00    Years: 15.00    Pack years: 30.00    Types: Cigarettes    Quit date: 09/04/1999    Years since quitting: 20.5  . Smokeless tobacco: Never Used  Vaping Use  . Vaping Use: Never used  Substance Use Topics  . Alcohol use: Yes    Alcohol/week: 0.0 standard drinks    Comment: Occasional  . Drug use: No     Allergies   Benazepril hcl and Sildenafil   Review of Systems Review of Systems  Constitutional: Negative.   HENT: Positive for congestion and rhinorrhea. Negative for ear pain, sinus pressure and sinus pain.   Eyes: Negative.   Respiratory: Negative.   Cardiovascular: Negative.   Gastrointestinal: Negative.   Allergic/Immunologic: Negative for environmental allergies.     Physical Exam Triage Vital Signs ED Triage Vitals  Enc Vitals Group     BP 03/22/20 1211 (!) 147/70     Pulse Rate 03/22/20 1211 68     Resp 03/22/20 1211 16     Temp 03/22/20 1211 98.8 F (37.1 C)     Temp Source 03/22/20 1211 Oral     SpO2 03/22/20 1211 100 %     Weight --      Height --      Head Circumference --      Peak Flow --      Pain Score 03/22/20 1209 0     Pain Loc --      Pain Edu? --      Excl. in [place]? --    No data found.  Updated Vital Signs BP (!) 147/70   Pulse 68   Temp 98.8 F (37.1 C) (Oral)   Resp 16   SpO2 100%   Visual Acuity Right Eye Distance:   Left Eye Distance:   Bilateral Distance:    Right Eye Near:   Left Eye Near:    Bilateral Near:     Physical Exam Constitutional:  Appearance: Normal appearance.  HENT:     Head: Normocephalic.     Right Ear: Tympanic membrane, ear canal and external ear normal.     Left Ear: Tympanic membrane, ear canal and external ear normal.     Nose: Congestion and rhinorrhea present. No nasal deformity, nasal tenderness or mucosal edema. Rhinorrhea is clear.      Right Turbinates: Swollen.     Left Turbinates: Swollen.     Right Sinus: No maxillary sinus tenderness or frontal sinus tenderness.     Left Sinus: No maxillary sinus tenderness or frontal sinus tenderness.     Mouth/Throat:     Mouth: Mucous membranes are moist.     Pharynx: Oropharynx is clear.  Cardiovascular:     Rate and Rhythm: Normal rate and regular rhythm.     Pulses: Normal pulses.     Heart sounds: Normal heart sounds.  Pulmonary:     Effort: Pulmonary effort is normal. No accessory muscle usage or respiratory distress.     Breath sounds: Normal breath sounds.  Skin:    General: Skin is warm and dry.  Neurological:     Mental Status: He is alert.      UC Treatments / Results  Labs (all labs ordered are listed, but only abnormal results are displayed) Labs Reviewed - No data to display  EKG   Radiology No results found.  Procedures Procedures (including critical care time)  Medications Ordered in UC Medications - No data to display  Initial Impression / Assessment and Plan / UC Course  I have reviewed the triage vital signs and the nursing notes.  Pertinent labs & imaging results that were available during my care of the patient were reviewed by me and considered in my medical decision making (see chart for details).     Sinusitis Not convinced this is bacterial infection at this time. We will have him increase the Flonase to 2 sprays daily Cetirizine for nasal congestion, runny nose and patient is a drip. Follow up as needed for continued or worsening symptoms  Final Clinical Impressions(s) / UC Diagnoses   Final diagnoses:  Acute non-recurrent maxillary sinusitis     Discharge Instructions     Flonase 2 sprays in each nostril once a day.  Cetirizine daily for nasal congestion, runny nose and postnasal drip. If your symptoms persist and are worsening after a week or more you can follow-up with your primary care for further  management.    ED Prescriptions    Medication Sig Dispense Auth. Provider   cetirizine (ZYRTEC) 10 MG tablet Take 1 tablet (10 mg total) by mouth daily. 30 tablet Loura Halt A, NP     PDMP not reviewed this encounter.   Orvan July, NP 03/23/20 [phone]

## 2020-03-22 NOTE — ED Triage Notes (Signed)
PT reports congestion and sinus drainage. Intermittent cough when drainage irritates throat. Has had COVID vaccine x2

## 2020-03-22 NOTE — Discharge Instructions (Signed)
Flonase 2 sprays in each nostril once a day.  Cetirizine daily for nasal congestion, runny nose and postnasal drip. If your symptoms persist and are worsening after a week or more you can follow-up with your primary care for further management.

## 2020-03-23 ENCOUNTER — Telehealth: Payer: Medicare PPO | Admitting: Internal Medicine

## 2020-03-25 ENCOUNTER — Telehealth (HOSPITAL_COMMUNITY): Payer: Self-pay | Admitting: Family Medicine

## 2020-03-25 ENCOUNTER — Telehealth (HOSPITAL_COMMUNITY): Payer: Self-pay | Admitting: Emergency Medicine

## 2020-03-25 MED ORDER — AMOXICILLIN-POT CLAVULANATE 875-125 MG PO TABS: 1.0000 | ORAL_TABLET | Freq: Two times a day (BID) | ORAL | 0 refills | Status: DC

## 2020-03-25 NOTE — Telephone Encounter (Signed)
Sending in antibiotics for sinus infection due to worsening symptoms.

## 2020-03-25 NOTE — Telephone Encounter (Signed)
Patient LVM stating he was told to call back if symptoms not improved.  Returned patient's call, verified identity using two identifiers.  States with flonase symptoms still have not improved.  Spoke to APP who did encourage patient to return call and states she will send in something to pharmacy for him.  Patient made aware, verbalized understanding.

## 2020-06-01 DIAGNOSIS — B351 Tinea unguium: Secondary | ICD-10-CM | POA: Diagnosis not present

## 2020-06-01 DIAGNOSIS — E1151 Type 2 diabetes mellitus with diabetic peripheral angiopathy without gangrene: Secondary | ICD-10-CM | POA: Diagnosis not present

## 2020-06-15 ENCOUNTER — Other Ambulatory Visit: Payer: Self-pay | Admitting: Internal Medicine

## 2020-06-15 MED ORDER — ACETAMINOPHEN-CODEINE #3 300-30 MG PO TABS: 1.0000 | ORAL_TABLET | Freq: Four times a day (QID) | ORAL | 0 refills | Status: DC | PRN

## 2020-06-15 NOTE — Telephone Encounter (Signed)
Done erx 

## 2020-07-15 ENCOUNTER — Other Ambulatory Visit: Payer: Self-pay | Admitting: Internal Medicine

## 2020-08-12 ENCOUNTER — Other Ambulatory Visit: Payer: Self-pay | Admitting: Internal Medicine

## 2020-09-01 DIAGNOSIS — B351 Tinea unguium: Secondary | ICD-10-CM | POA: Diagnosis not present

## 2020-09-01 DIAGNOSIS — E1151 Type 2 diabetes mellitus with diabetic peripheral angiopathy without gangrene: Secondary | ICD-10-CM | POA: Diagnosis not present

## 2020-09-09 ENCOUNTER — Encounter: Payer: Self-pay | Admitting: Gastroenterology

## 2020-09-09 ENCOUNTER — Ambulatory Visit (INDEPENDENT_AMBULATORY_CARE_PROVIDER_SITE_OTHER): Payer: Medicare PPO | Admitting: Gastroenterology

## 2020-09-09 VITALS — BP 122/70 | HR 70 | Ht 74.0 in | Wt 269.0 lb

## 2020-09-09 DIAGNOSIS — R197 Diarrhea, unspecified: Secondary | ICD-10-CM | POA: Diagnosis not present

## 2020-09-09 NOTE — Progress Notes (Signed)
09/09/2020 [name] [name] [id number] [birth year]   HISTORY OF PRESENT ILLNESS: This is a 70 year old male who is a patient of Dr. Blanch Media.  He follows here regularly for his colonoscopies and flexible sigmoidoscopy evaluations due to history of malignant polyp at the anal verge and history of polyps with high-grade dysplasia.  He had a flexible sigmoidoscopy in June 2020 that was completely normal.  Last colonoscopy was in March 2019.  He will be due for a full colonoscopy in June 2023.  He presents here today with complaints of intermittent diarrhea.  He says usually anywhere from 1-3 times per week.  It occurs randomly.  Sometimes they notice that it occurs if he eats a greasy or fatty or meal, but other times not.  He says that usually about 20 minutes after eating his stomach will start "boiling" and then he will have diarrhea usually multiple times before it resolves.  He takes Imodium when this occurs and that does seem to help.  They just wanted to be sure that there is nothing more serious going on.  He does admit to what he thinks is some lactose intolerant.  He drinks almond milk now.  He really does not eat ice cream or drink milkshakes because it upsets his stomach and gives him diarrhea.  He does eat a lot of cheese and does use butter.  He is on a Metformin-containing medication for his diabetes and colchicine for his gout.  He does admit that he is under a lot of stress recently.  He denies seeing any blood in his stools or any black stools.  He denies abdominal pain.   Past Medical History:  Diagnosis Date  . Allergy   . Bilateral renal cysts    benign/ Patient denies  . COPD (chronic obstructive pulmonary disease) ([place])     PFT 2010 due to previous 20y h/o smoking  . DDD (degenerative disc disease), lumbar   . Diabetes mellitus type II   . ED (erectile dysfunction) of organic origin   . First degree heart block   . History of colon polyps   . History of prostate cancer  current PSA per pt 0.13   dx March 2014---  s/p radiactive prostate seed implants and intraop radiotherapy in [place], [place]  . Hyperlipidemia   . Hypertension   . Liver mass 06/2013   OVER HALF OF LIVER REMOVED/ BENIGN  . OSA on CPAP   . Osteoarthritis   . PAF (paroxysmal atrial fibrillation) [facility])    cardiologist--  dr hochrein  (episode prior to liver surgery at [facility] 2014 w/ post-op RVR)  . Prostate cancer ([place]) 11-11-2012   s/p chemo, radiation   . Rectal cancer ([employer])    01-25-2015----per pathology --  complete rectal polypectomy-- showed  invasive carcinoma rising in tubular adnemoa high grade hyperplasia--  per dr Ardis Hughs per MRI  no other area seen -- will have colonoscopy done in 3 months  . Sleep apnea    wears cpap  . Ulcer    STOMACH ULCER  . Wears glasses    Past Surgical History:  Procedure Laterality Date  .  RIGHT LOBE HEPATECTOMY  06-12-2013    [facility]   benign mass  . CARDIOVASCULAR STRESS TEST  10-01-2014   dr hochrein   normal perfusion study, no ischemia,  normal LV function and wall motion, ef 70%  . COLONOSCOPY    . ESOPHAGOGASTRODUODENOSCOPY N/A 06/23/2016   Procedure: ESOPHAGOGASTRODUODENOSCOPY (EGD);  Surgeon:  Manus Gunning, MD;  Location: [place];  Service: Gastroenterology;  Laterality: N/A;  . EUS N/A 02/10/2015   Procedure: LOWER ENDOSCOPIC ULTRASOUND (EUS);  Surgeon: Milus Banister, MD;  Location: [facility];  Service: Endoscopy;  Laterality: N/A;  . KNEE ARTHROSCOPY Bilateral 1990's  . LUMBAR FUSION  2001  . PENILE PROSTHESIS IMPLANT N/A 04/25/2015   Procedure: PENILE PROSTHESIS THREE PIECE INFLATABLE( COLOPLAST) SCROTAL APPROACH;  Surgeon: Kathie Rhodes, MD;  Location: [place];  Service: Urology;  Laterality: N/A;  . POLYPECTOMY    . RADIOACTIVE PROSTATE SEED IMPLANTS  May 2014  in [place], [place] Right 02/21/2016  . TOTAL KNEE ARTHROPLASTY Left 10- 2013  . TRANSTHORACIC ECHOCARDIOGRAM   12-26-2012   mild focal basal septal hypertrophy,  grade II diastolic dysfunction,  ef 55-60%,  mild LAE,  trivial MR and TR  . UPPER GASTROINTESTINAL ENDOSCOPY    . VASECTOMY  1980's w/ gen. anes.    reports that he quit smoking about 21 years ago. His smoking use included cigarettes. He has a 30.00 pack-year smoking history. He has never used smokeless tobacco. He reports current alcohol use. He reports that he does not use drugs. family history includes Diabetes in his mother; Heart disease in his mother; Hypertension in an other family member; Pancreatic cancer in his father and sister; Stomach cancer in his father and sister. Allergies  Allergen Reactions  . Benazepril Hcl Cough  . Sildenafil Other (See Comments)    Caused heart to race and elevated B/P      Outpatient Encounter Medications as of 09/09/2020  Medication Sig  . acetaminophen-codeine (TYLENOL #3) 300-30 MG tablet Take 1-2 tablets by mouth every 6 (six) hours as needed for moderate pain.  Marland Kitchen amLODipine-olmesartan (AZOR) 10-40 MG tablet Take 1 tablet by mouth daily.  Marland Kitchen amoxicillin-clavulanate (AUGMENTIN) 875-125 MG tablet Take 1 tablet by mouth every 12 (twelve) hours.  Marland Kitchen aspirin EC 81 MG tablet Take 1 tablet (81 mg total) by mouth daily.  . blood glucose meter kit and supplies KIT Dispense based on patient and insurance preference. Use up to four times daily as directed. (FOR ICD-9 250.00, 250.01).  . Carboxymethylcellulose Sodium (REFRESH LIQUIGEL OP) Apply 2 drops to eye as needed (for dry eyes).   . cetirizine (ZYRTEC) 10 MG tablet Take 1 tablet (10 mg total) by mouth daily.  . Colchicine 0.6 MG CAPS TAKE ONE CAPSULE BY MOUTH EVERY DAY AS NEEDED FOR GOUT  . colchicine 0.6 MG tablet TAKE 1 TABLET(0.6 MG) BY MOUTH DAILY AS NEEDED FOR GOUT  . diclofenac Sodium (VOLTAREN) 1 % GEL APPLY 2 GRAMS EXTERNALLY TO THE AFFECTED AREA TWICE DAILY  . fluticasone (FLONASE) 50 MCG/ACT nasal spray USE ONE SPRAY IN EACH NOSTRIL DAILY  .  furosemide (LASIX) 20 MG tablet Take 1-2 tablets (20-40 mg total) by mouth daily as needed for edema.  . hydrochlorothiazide (HYDRODIURIL) 25 MG tablet Take 1 tablet (25 mg total) by mouth daily.  . indomethacin (INDOCIN) 50 MG capsule Take 1 capsule (50 mg total) by mouth 3 (three) times daily as needed for moderate pain (gout).  . Iron-Vitamins (GERITOL COMPLETE PO) Take 1 tablet by mouth every morning. With Iron  . metoprolol tartrate (LOPRESSOR) 50 MG tablet Take 1 tablet (50 mg total) by mouth daily. Follow-up appt due in Oct must see provider for future refills  . pantoprazole (PROTONIX) 20 MG tablet Take 1 tablet (20 mg total) by mouth 2 (two) times  daily before a meal.  . pioglitazone-metformin (ACTOPLUS MET) 15-850 MG tablet TAKE 1 TABLET BY MOUTH TWICE DAILY( EVERY TWELVE HOURS)  . potassium chloride SA (KLOR-CON) 20 MEQ tablet TAKE 1 TABLET(20 MEQ) BY MOUTH DAILY  . pravastatin (PRAVACHOL) 40 MG tablet TAKE ONE TABLET BY MOUTH EACH EVENING  . tamsulosin (FLOMAX) 0.4 MG CAPS capsule Take 1 capsule (0.4 mg total) by mouth every morning.   No facility-administered encounter medications on file as of 09/09/2020.     REVIEW OF SYSTEMS  : All other systems reviewed and negative except where noted in the History of Present Illness.   PHYSICAL EXAM: BP 122/70   Pulse 70   Ht '6\' 2"'  (1.88 m)   Wt 269 lb (122 kg)   SpO2 97%   BMI 34.54 kg/m  General: Well developed AA male in no acute distress Head: Normocephalic and atraumatic Eyes:  Sclerae anicteric, conjunctiva pink. Ears: Normal auditory acuity Lungs: Clear throughout to auscultation; no W/R/R. Heart: Regular rate and rhythm; no M/R/G. Abdomen: Soft, non-distended.  BS present.  Non-tender. Musculoskeletal: Symmetrical with no gross deformities  Skin: No lesions on visible extremities Extremities: No edema  Neurological: Alert oriented x 4, grossly non-focal Psychological:  Alert and cooperative. Normal mood and  affect  ASSESSMENT AND PLAN: *Intermittent diarrhea: Likely dietary related.  He does admit to lactose intolerance, but still eats a lot of cheese and uses butter.  We discussed lactose-free diet and he was given literature on this.  He will try that strictly for about a month and see how much things improve.  Could also be some IBS and stress related.  He asked about a probiotic and I said that certainly would not hurt to try.  He is also on Metformin containing medication for his diabetes and colchicine for gout.  I wonder if these can be playing an issue.  He will discuss that with Dr. Alain Marion.   CC:  Plotnikov, Evie Lacks, MD

## 2020-09-09 NOTE — Progress Notes (Signed)
Noted  

## 2020-09-09 NOTE — Patient Instructions (Addendum)
If you are age 71 or older, your body mass index should be between 23-30. Your Body mass index is 34.54 kg/m. If this is out of the aforementioned range listed, please consider follow up with your Primary Care Provider.  If you are age 84 or younger, your body mass index should be between 19-25. Your Body mass index is 34.54 kg/m. If this is out of the aformentioned range listed, please consider follow up with your Primary Care Provider.   Please follow lactose free diet.  Consider talking to Dr.Plotnika about Colchicine and Metformin.  Thank you for choosing me and [facility].  Alonza Bogus, PA-C

## 2020-09-15 DIAGNOSIS — C61 Malignant neoplasm of prostate: Secondary | ICD-10-CM | POA: Diagnosis not present

## 2020-09-22 DIAGNOSIS — R3915 Urgency of urination: Secondary | ICD-10-CM | POA: Diagnosis not present

## 2020-09-22 DIAGNOSIS — N5201 Erectile dysfunction due to arterial insufficiency: Secondary | ICD-10-CM | POA: Diagnosis not present

## 2020-09-22 DIAGNOSIS — C61 Malignant neoplasm of prostate: Secondary | ICD-10-CM | POA: Diagnosis not present

## 2020-10-07 ENCOUNTER — Other Ambulatory Visit: Payer: Self-pay | Admitting: Internal Medicine

## 2020-11-30 DIAGNOSIS — E1151 Type 2 diabetes mellitus with diabetic peripheral angiopathy without gangrene: Secondary | ICD-10-CM | POA: Diagnosis not present

## 2020-11-30 DIAGNOSIS — B351 Tinea unguium: Secondary | ICD-10-CM | POA: Diagnosis not present

## 2021-01-05 ENCOUNTER — Other Ambulatory Visit: Payer: Self-pay | Admitting: Internal Medicine

## 2021-01-28 ENCOUNTER — Other Ambulatory Visit: Payer: Self-pay | Admitting: Internal Medicine

## 2021-02-15 DIAGNOSIS — H5203 Hypermetropia, bilateral: Secondary | ICD-10-CM | POA: Diagnosis not present

## 2021-02-15 DIAGNOSIS — H25043 Posterior subcapsular polar age-related cataract, bilateral: Secondary | ICD-10-CM | POA: Diagnosis not present

## 2021-02-15 DIAGNOSIS — H2513 Age-related nuclear cataract, bilateral: Secondary | ICD-10-CM | POA: Diagnosis not present

## 2021-02-15 DIAGNOSIS — E119 Type 2 diabetes mellitus without complications: Secondary | ICD-10-CM | POA: Diagnosis not present

## 2021-02-15 LAB — HM DIABETES EYE EXAM

## 2021-02-17 ENCOUNTER — Encounter: Payer: Self-pay | Admitting: Internal Medicine

## 2021-02-23 DIAGNOSIS — B351 Tinea unguium: Secondary | ICD-10-CM | POA: Diagnosis not present

## 2021-02-23 DIAGNOSIS — E1151 Type 2 diabetes mellitus with diabetic peripheral angiopathy without gangrene: Secondary | ICD-10-CM | POA: Diagnosis not present

## 2021-03-08 ENCOUNTER — Other Ambulatory Visit: Payer: Self-pay | Admitting: Internal Medicine

## 2021-03-14 DIAGNOSIS — H2511 Age-related nuclear cataract, right eye: Secondary | ICD-10-CM | POA: Diagnosis not present

## 2021-03-14 DIAGNOSIS — H25811 Combined forms of age-related cataract, right eye: Secondary | ICD-10-CM | POA: Diagnosis not present

## 2021-03-14 DIAGNOSIS — H25041 Posterior subcapsular polar age-related cataract, right eye: Secondary | ICD-10-CM | POA: Diagnosis not present

## 2021-04-04 DIAGNOSIS — H2512 Age-related nuclear cataract, left eye: Secondary | ICD-10-CM | POA: Diagnosis not present

## 2021-04-04 DIAGNOSIS — H25042 Posterior subcapsular polar age-related cataract, left eye: Secondary | ICD-10-CM | POA: Diagnosis not present

## 2021-04-04 DIAGNOSIS — H25812 Combined forms of age-related cataract, left eye: Secondary | ICD-10-CM | POA: Diagnosis not present

## 2021-04-11 ENCOUNTER — Telehealth: Payer: Self-pay | Admitting: Internal Medicine

## 2021-04-11 NOTE — Telephone Encounter (Signed)
LVM for pt to rtn my call at [phone] to schedule AWV with [facility]. Please schedule this appt if pt calls of comes into the office.  

## 2021-05-19 DIAGNOSIS — H26491 Other secondary cataract, right eye: Secondary | ICD-10-CM | POA: Diagnosis not present

## 2021-05-25 ENCOUNTER — Other Ambulatory Visit: Payer: Self-pay | Admitting: Internal Medicine

## 2021-05-26 DIAGNOSIS — Z961 Presence of intraocular lens: Secondary | ICD-10-CM | POA: Diagnosis not present

## 2021-06-01 DIAGNOSIS — B351 Tinea unguium: Secondary | ICD-10-CM | POA: Diagnosis not present

## 2021-06-01 DIAGNOSIS — E1151 Type 2 diabetes mellitus with diabetic peripheral angiopathy without gangrene: Secondary | ICD-10-CM | POA: Diagnosis not present

## 2021-08-10 ENCOUNTER — Other Ambulatory Visit: Payer: Self-pay | Admitting: Internal Medicine

## 2021-08-10 DIAGNOSIS — B351 Tinea unguium: Secondary | ICD-10-CM | POA: Diagnosis not present

## 2021-08-10 DIAGNOSIS — E1151 Type 2 diabetes mellitus with diabetic peripheral angiopathy without gangrene: Secondary | ICD-10-CM | POA: Diagnosis not present

## 2021-08-30 ENCOUNTER — Ambulatory Visit (INDEPENDENT_AMBULATORY_CARE_PROVIDER_SITE_OTHER): Payer: Medicare PPO

## 2021-08-30 ENCOUNTER — Ambulatory Visit (INDEPENDENT_AMBULATORY_CARE_PROVIDER_SITE_OTHER): Payer: Medicare PPO | Admitting: Internal Medicine

## 2021-08-30 ENCOUNTER — Other Ambulatory Visit: Payer: Self-pay

## 2021-08-30 ENCOUNTER — Encounter: Payer: Self-pay | Admitting: Internal Medicine

## 2021-08-30 DIAGNOSIS — E118 Type 2 diabetes mellitus with unspecified complications: Secondary | ICD-10-CM

## 2021-08-30 DIAGNOSIS — M5442 Lumbago with sciatica, left side: Secondary | ICD-10-CM | POA: Diagnosis not present

## 2021-08-30 DIAGNOSIS — M47816 Spondylosis without myelopathy or radiculopathy, lumbar region: Secondary | ICD-10-CM | POA: Diagnosis not present

## 2021-08-30 DIAGNOSIS — G8929 Other chronic pain: Secondary | ICD-10-CM

## 2021-08-30 DIAGNOSIS — M5441 Lumbago with sciatica, right side: Secondary | ICD-10-CM

## 2021-08-30 DIAGNOSIS — M48061 Spinal stenosis, lumbar region without neurogenic claudication: Secondary | ICD-10-CM | POA: Diagnosis not present

## 2021-08-30 MED ORDER — METHYLPREDNISOLONE 4 MG PO TBPK: ORAL_TABLET | ORAL | 0 refills | Status: DC

## 2021-08-30 MED ORDER — ACETAMINOPHEN-CODEINE #3 300-30 MG PO TABS: 1.0000 | ORAL_TABLET | Freq: Four times a day (QID) | ORAL | 0 refills | Status: DC | PRN

## 2021-08-30 NOTE — Assessment & Plan Note (Addendum)
New LBP, B LE pain - r/o spinal stenosis. T#3 prn - rare use  Potential benefits of a long term opioids use as well as potential risks (i.e. addiction risk, apnea etc) and complications (i.e. Somnolence, constipation and others) were explained to the patient and were aknowledged. Medrol pack po RTC 4 wks LS x ray

## 2021-08-30 NOTE — Patient Instructions (Signed)
Spinal Stenosis Spinal stenosis is a condition that happens when the spinal canal narrows. The spinal canal is the space between the bones of your spine (vertebrae). This narrowing puts pressure on the spinal cord or nerves. Spinal stenosis can affect the vertebrae in the neck, upper back, and lower back. This condition can range from mild to severe. In some cases, there are no symptoms. What are the causes? This condition is caused by areas of bone pushing into the spinal canal. This condition may be present at birth (congenital), or it may be caused by: Slow breakdown of your vertebrae (spinal degeneration). This usually starts around 71 years of age. Injury (trauma) to your spine. Tumors in your spine. Calcium deposits in your spine. What increases the risk? The following factors may make you more likely to develop this condition: Being older than age 50. Having a problem present at birth with an abnormally shaped spine (congenitalspinal deformity), such as scoliosis. Having arthritis. What are the signs or symptoms? Symptoms of this condition include: Pain in the neck or back that is generally worse with activities, particularly when you stand or walk. Numbness, tingling, hot or cold sensations, weakness, or tiredness (fatigue) in your leg or legs. Pain going from the buttock, down the thigh, and to the calf (sciatica). This can happen in one or both legs. Frequent episodes of falling. A foot-slapping gait that leads to muscle weakness. In more severe cases, you may develop: Problems having a bowel movement or urinating. Difficulty having sex. Loss of feeling in your legs and inability to walk. Symptoms may come on slowly and get worse over time. In some cases, there are no symptoms. How is this diagnosed? This condition is diagnosed based on your medical history and a physical exam. You will also have tests, such as an MRI, a CT scan, or an X-ray. How is this treated? Treatment for  this condition often focuses on managing your pain and any other symptoms. Treatment may include: Practicing good posture to lessen pressure on your nerves. Exercising to strengthen muscles, build endurance, improve balance, and maintain range of motion. This may include physical therapy to restore movement and strength to your back. Losing weight, if needed. Medicines to reduce inflammation or pain. This may include a medicine that is injected into your spine (steroidinjection). Assistive devices, such as a corset or brace. In some cases, surgery may be needed. The most common procedure is decompression laminectomy. This is done to remove excess bone that puts pressure on your nerve roots. Follow these instructions at home: Managing pain, stiffness, and swelling  Practice good posture. If you were given a brace or a corset, wear it as told by your health care provider. Maintain a healthy weight. Talk with your health care provider if you need help losing weight. If directed, apply heat to the affected area as often as told by your health care provider. Use the heat source that your health care provider recommends, such as a moist heat pack or a heating pad. Place a towel between your skin and the heat source. Leave the heat on for 20-30 minutes. Remove the heat if your skin turns bright red. This is especially important if you are unable to feel pain, heat, or cold. You may have a greater risk of getting burned. Activity Do all exercises and stretches as told by your health care provider. Do not do any activities that cause pain. Ask your health care provider what activities are safe for you. Do   not lift anything that is heavier than 10 lb (4.5 kg), or the limit that you are told by your health care provider. Return to your normal activities as told by your health care provider. Ask your health care provider what activities are safe for you. General instructions Take over-the-counter and  prescription medicines only as told by your health care provider. Do not use any products that contain nicotine or tobacco, such as cigarettes, e-cigarettes, and chewing tobacco. If you need help quitting, ask your health care provider. Eat a healthy diet. This includes plenty of fruits and vegetables, whole grains, and low-fat (lean) protein. Keep all follow-up visits as told by your health care provider. This is important. Contact a health care provider if: Your symptoms do not get better or they get worse. You have a fever. Get help right away if: You have new pain or symptoms of severe pain, such as: New or worsening pain in your neck or upper back. Severe pain that cannot be controlled with medicines. A severe headache that gets worse when you stand. You are dizzy. You have vision problems, such as blurred vision or double vision. You have nausea or you vomit. You develop new or worsening numbness or tingling in your back or legs. You have pain, redness, swelling, or warmth in your arm or leg. Summary Spinal stenosis is a condition that happens when the spinal canal narrows. The spinal canal is the space between the bones of your spine (vertebrae). This narrowing puts pressure on the spinal cord or nerves. This condition may be caused by a birth defect, breakdown of your vertebrae, trauma, tumors, or calcium deposits. Spinal stenosis can cause numbness, weakness, or pain in the buttocks, neck, back, and legs. This condition is usually diagnosed with your medical history, a physical exam, and tests, such as an MRI, a CT scan, or an X-ray. This information is not intended to replace advice given to you by your health care provider. Make sure you discuss any questions you have with your health care provider. Document Revised: 06/18/2019 Document Reviewed: 06/18/2019 Elsevier Patient Education  2022 [employer]  

## 2021-08-30 NOTE — Progress Notes (Signed)
Subjective:  Patient ID: [name] [name], male    DOB: 07/03/1951  Age: 70 y.o. MRN: [id number]  CC: No chief complaint on file.   HPI AARIZ MAISH presents for LBP irrad to B legs x 2 days. No injury...  Pain is 7-8/10 at times; worse w/standing, walking    Outpatient Medications Prior to Visit  Medication Sig Dispense Refill   amLODipine-olmesartan (AZOR) 10-40 MG tablet TAKE 1 TABLET BY MOUTH DAILY 90 tablet 1   aspirin EC 81 MG tablet Take 1 tablet (81 mg total) by mouth daily. 100 tablet 3   blood glucose meter kit and supplies KIT Dispense based on patient and insurance preference. Use up to four times daily as directed. (FOR ICD-9 250.00, 250.01). 1 each 0   Carboxymethylcellulose Sodium (REFRESH LIQUIGEL OP) Apply 2 drops to eye as needed (for dry eyes).      cetirizine (ZYRTEC) 10 MG tablet Take 1 tablet (10 mg total) by mouth daily. 30 tablet 0   Colchicine 0.6 MG CAPS TAKE ONE CAPSULE BY MOUTH EVERY DAY AS NEEDED FOR GOUT 90 capsule 0   diclofenac Sodium (VOLTAREN) 1 % GEL APPLY TOPICALLY TO THE AFFECTED AREA FOUR TIMES DAILY 100 g 0   fluticasone (FLONASE) 50 MCG/ACT nasal spray USE ONE SPRAY IN EACH NOSTRIL DAILY 48 g 3   furosemide (LASIX) 20 MG tablet Take 1-2 tablets (20-40 mg total) by mouth daily as needed for edema. 180 tablet 3   hydrochlorothiazide (HYDRODIURIL) 25 MG tablet Take 1 tablet (25 mg total) by mouth daily. 90 tablet 3   indomethacin (INDOCIN) 50 MG capsule TAKE 1 CAPSULE(50 MG) BY MOUTH THREE TIMES DAILY AS NEEDED FOR MODERATE PAIN OR GOUT 60 capsule 3   Iron-Vitamins (GERITOL COMPLETE PO) Take 1 tablet by mouth every morning. With Iron     metoprolol tartrate (LOPRESSOR) 50 MG tablet Take 1 tablet (50 mg total) by mouth daily. Follow-up appt due in Oct must see provider for future refills 90 tablet 3   pantoprazole (PROTONIX) 20 MG tablet Take 1 tablet (20 mg total) by mouth 2 (two) times daily before a meal. 180 tablet 3    pioglitazone-metformin (ACTOPLUS MET) 15-850 MG tablet TAKE 1 TABLET BY MOUTH TWICE DAILY( EVERY TWELVE HOURS) 180 tablet 3   potassium chloride SA (KLOR-CON) 20 MEQ tablet TAKE 1 TABLET(20 MEQ) BY MOUTH DAILY 90 tablet 3   pravastatin (PRAVACHOL) 40 MG tablet TAKE ONE TABLET BY MOUTH EACH EVENING 90 tablet 3   tamsulosin (FLOMAX) 0.4 MG CAPS capsule Take 1 capsule (0.4 mg total) by mouth every morning. 90 capsule 3   acetaminophen-codeine (TYLENOL #3) 300-30 MG tablet Take 1-2 tablets by mouth every 6 (six) hours as needed for moderate pain. 60 tablet 0   colchicine 0.6 MG tablet TAKE 1 TABLET(0.6 MG) BY MOUTH DAILY AS NEEDED FOR GOUT 90 tablet 3   amoxicillin-clavulanate (AUGMENTIN) 875-125 MG tablet Take 1 tablet by mouth every 12 (twelve) hours. (Patient not taking: Reported on 08/30/2021) 14 tablet 0   ASPIRIN LOW DOSE 81 MG chewable tablet Chew by mouth.     DUREZOL 0.05 % EMUL Apply to eye.     losartan (COZAAR) 100 MG tablet Take 100 mg by mouth daily.     metFORMIN (GLUCOPHAGE) 850 MG tablet Take 850 mg by mouth 2 (two) times daily.     moxifloxacin (VIGAMOX) 0.5 % ophthalmic solution Apply 1 drop to eye 4 (four) times daily.     pantoprazole (  PROTONIX) 40 MG tablet Take 40 mg by mouth 2 (two) times daily.     No facility-administered medications prior to visit.    ROS: Review of Systems  Constitutional:  Negative for appetite change, fatigue and unexpected weight change.  HENT:  Negative for congestion, nosebleeds, sneezing, sore throat and trouble swallowing.   Eyes:  Negative for itching and visual disturbance.  Respiratory:  Negative for cough.   Cardiovascular:  Negative for chest pain, palpitations and leg swelling.  Gastrointestinal:  Negative for abdominal distention, blood in stool, diarrhea and nausea.  Genitourinary:  Negative for frequency and hematuria.  Musculoskeletal:  Positive for back pain and gait problem. Negative for joint swelling and neck pain.  Skin:   Negative for rash.  Neurological:  Negative for dizziness, tremors, speech difficulty and weakness.  Psychiatric/Behavioral:  Negative for agitation, dysphoric mood and sleep disturbance. The patient is not nervous/anxious.    Objective:  There were no vitals taken for this visit.  BP Readings from Last 3 Encounters:  09/09/20 122/70  03/22/20 (!) 147/70  03/10/20 130/62    Wt Readings from Last 3 Encounters:  09/09/20 269 lb (122 kg)  03/10/20 271 lb 6.4 oz (123.1 kg)  12/08/19 279 lb (126.6 kg)    Physical Exam Constitutional:      General: He is not in acute distress.    Appearance: He is well-developed.     Comments: NAD  Eyes:     Conjunctiva/sclera: Conjunctivae normal.     Pupils: Pupils are equal, round, and reactive to light.  Neck:     Thyroid: No thyromegaly.     Vascular: No JVD.  Cardiovascular:     Rate and Rhythm: Normal rate and regular rhythm.     Heart sounds: Normal heart sounds. No murmur heard.   No friction rub. No gallop.  Pulmonary:     Effort: Pulmonary effort is normal. No respiratory distress.     Breath sounds: Normal breath sounds. No wheezing or rales.  Chest:     Chest wall: No tenderness.  Abdominal:     General: Bowel sounds are normal. There is no distension.     Palpations: Abdomen is soft. There is no mass.     Tenderness: There is no abdominal tenderness. There is no guarding or rebound.  Musculoskeletal:        General: Tenderness present. Normal range of motion.     Cervical back: Normal range of motion.  Lymphadenopathy:     Cervical: No cervical adenopathy.  Skin:    General: Skin is warm and dry.     Findings: No rash.  Neurological:     Mental Status: He is alert and oriented to person, place, and time.     Cranial Nerves: No cranial nerve deficit.     Motor: No abnormal muscle tone.     Coordination: Coordination normal.     Gait: Gait normal.     Deep Tendon Reflexes: Reflexes are normal and symmetric.   Psychiatric:        Behavior: Behavior normal.        Thought Content: Thought content normal.        Judgment: Judgment normal.  Antalgic gait LS stiff Str leg (+/-) B Lab Results  Component Value Date   WBC 4.6 03/10/2020   HGB 15.1 03/10/2020   HCT 44.0 03/10/2020   PLT 304 03/10/2020   GLUCOSE 128 (H) 03/10/2020   CHOL 195 03/10/2020   TRIG 95 03/10/2020  HDL 49 03/10/2020   LDLDIRECT 125.4 07/14/2008   LDLCALC 126 (H) 03/10/2020   ALT 18 03/10/2020   AST 18 03/10/2020   NA 140 03/10/2020   K 3.7 03/10/2020   CL 106 03/10/2020   CREATININE 1.05 03/10/2020   BUN 20 03/10/2020   CO2 29 03/10/2020   TSH 2.35 03/10/2020   PSA <0.1 03/10/2020   INR 1.14 06/23/2016   HGBA1C 6.7 (H) 03/10/2020   MICROALBUR 0.3 03/10/2020    No results found.  Assessment & Plan:   Problem List Items Addressed This Visit     Bilateral low back pain with bilateral sciatica - Primary    New LBP, B LE pain - r/o spinal stenosis. T#3 prn - rare use  Potential benefits of a long term opioids use as well as potential risks (i.e. addiction risk, apnea etc) and complications (i.e. Somnolence, constipation and others) were explained to the patient and were aknowledged. Medrol pack po RTC 4 wks LS x ray      Relevant Medications   ASPIRIN LOW DOSE 81 MG chewable tablet   methylPREDNISolone (MEDROL DOSEPAK) 4 MG TBPK tablet   acetaminophen-codeine (TYLENOL #3) 300-30 MG tablet   Other Relevant Orders   DG Lumbar Spine 2-3 Views   DM (diabetes mellitus), type 2 with complications ([place])     Cont on Actos/Metformin Medrol effect on DM discussed RTC 4 wks      Relevant Medications   ASPIRIN LOW DOSE 81 MG chewable tablet   metFORMIN (GLUCOPHAGE) 850 MG tablet   losartan (COZAAR) 100 MG tablet      Meds ordered this encounter  Medications   methylPREDNISolone (MEDROL DOSEPAK) 4 MG TBPK tablet    Sig: As directed    Dispense:  21 tablet    Refill:  0   acetaminophen-codeine  (TYLENOL #3) 300-30 MG tablet    Sig: Take 1-2 tablets by mouth every 6 (six) hours as needed for moderate pain.    Dispense:  60 tablet    Refill:  0      Follow-up: Return in about 4 weeks (around 09/27/2021) for a follow-up visit.  Walker Kehr, MD

## 2021-08-30 NOTE — Assessment & Plan Note (Signed)
°  Cont on Actos/Metformin Medrol effect on DM discussed RTC 4 wks

## 2021-09-18 DIAGNOSIS — C61 Malignant neoplasm of prostate: Secondary | ICD-10-CM | POA: Diagnosis not present

## 2021-09-25 DIAGNOSIS — R3915 Urgency of urination: Secondary | ICD-10-CM | POA: Diagnosis not present

## 2021-09-25 DIAGNOSIS — N5201 Erectile dysfunction due to arterial insufficiency: Secondary | ICD-10-CM | POA: Diagnosis not present

## 2021-09-25 DIAGNOSIS — C61 Malignant neoplasm of prostate: Secondary | ICD-10-CM | POA: Diagnosis not present

## 2021-10-10 ENCOUNTER — Other Ambulatory Visit: Payer: Self-pay | Admitting: Internal Medicine

## 2021-10-13 ENCOUNTER — Telehealth: Payer: Self-pay

## 2021-10-13 NOTE — Telephone Encounter (Signed)
Pt is calling in requesting a  refill on : metFORMIN (GLUCOPHAGE) 850 MG tablet  Pharmacy: [facility], [place] - [id number]  LOV: 08/30/21  Pt CB [phone]

## 2021-10-19 DIAGNOSIS — M9903 Segmental and somatic dysfunction of lumbar region: Secondary | ICD-10-CM | POA: Diagnosis not present

## 2021-10-19 DIAGNOSIS — M5431 Sciatica, right side: Secondary | ICD-10-CM | POA: Diagnosis not present

## 2021-10-19 DIAGNOSIS — M5432 Sciatica, left side: Secondary | ICD-10-CM | POA: Diagnosis not present

## 2021-10-19 MED ORDER — METFORMIN HCL 850 MG PO TABS: 850.0000 mg | ORAL_TABLET | Freq: Two times a day (BID) | ORAL | 3 refills | Status: DC

## 2021-10-19 NOTE — Telephone Encounter (Signed)
Ok Thx 

## 2021-10-19 NOTE — Addendum Note (Signed)
Addended by: Cassandria Anger on: 10/19/2021 11:42 PM   Modules accepted: Orders

## 2021-10-20 ENCOUNTER — Other Ambulatory Visit: Payer: Self-pay | Admitting: Internal Medicine

## 2021-10-23 DIAGNOSIS — M5431 Sciatica, right side: Secondary | ICD-10-CM | POA: Diagnosis not present

## 2021-10-23 DIAGNOSIS — M5432 Sciatica, left side: Secondary | ICD-10-CM | POA: Diagnosis not present

## 2021-10-23 DIAGNOSIS — M9903 Segmental and somatic dysfunction of lumbar region: Secondary | ICD-10-CM | POA: Diagnosis not present

## 2021-10-28 ENCOUNTER — Other Ambulatory Visit: Payer: Self-pay

## 2021-10-28 ENCOUNTER — Emergency Department (HOSPITAL_BASED_OUTPATIENT_CLINIC_OR_DEPARTMENT_OTHER)
Admission: EM | Admit: 2021-10-28 | Discharge: 2021-10-28 | Disposition: A | Discharge status: Home / Self Care | Payer: Medicare PPO | Attending: Emergency Medicine | Admitting: Emergency Medicine

## 2021-10-28 ENCOUNTER — Encounter (HOSPITAL_BASED_OUTPATIENT_CLINIC_OR_DEPARTMENT_OTHER): Payer: Self-pay

## 2021-10-28 DIAGNOSIS — M79652 Pain in left thigh: Secondary | ICD-10-CM | POA: Diagnosis not present

## 2021-10-28 DIAGNOSIS — Z79899 Other long term (current) drug therapy: Secondary | ICD-10-CM | POA: Diagnosis not present

## 2021-10-28 DIAGNOSIS — Z85048 Personal history of other malignant neoplasm of rectum, rectosigmoid junction, and anus: Secondary | ICD-10-CM | POA: Diagnosis not present

## 2021-10-28 DIAGNOSIS — I10 Essential (primary) hypertension: Secondary | ICD-10-CM | POA: Insufficient documentation

## 2021-10-28 DIAGNOSIS — J449 Chronic obstructive pulmonary disease, unspecified: Secondary | ICD-10-CM | POA: Diagnosis not present

## 2021-10-28 DIAGNOSIS — Z7982 Long term (current) use of aspirin: Secondary | ICD-10-CM | POA: Diagnosis not present

## 2021-10-28 DIAGNOSIS — Z7984 Long term (current) use of oral hypoglycemic drugs: Secondary | ICD-10-CM | POA: Diagnosis not present

## 2021-10-28 DIAGNOSIS — E119 Type 2 diabetes mellitus without complications: Secondary | ICD-10-CM | POA: Diagnosis not present

## 2021-10-28 MED ORDER — HYDROCODONE-ACETAMINOPHEN 5-325 MG PO TABS
1.0000 | ORAL_TABLET | Freq: Once | ORAL | Status: AC
  Administered 2021-10-28: 1 via ORAL
  Filled 2021-10-28: qty 1

## 2021-10-28 MED ORDER — ACETAMINOPHEN-CODEINE #3 300-30 MG PO TABS: 1.0000 | ORAL_TABLET | Freq: Four times a day (QID) | ORAL | 0 refills | Status: DC | PRN

## 2021-10-28 MED ORDER — ACETAMINOPHEN-CODEINE #3 300-30 MG PO TABS: 1.0000 | ORAL_TABLET | Freq: Four times a day (QID) | ORAL | 0 refills | Status: AC | PRN

## 2021-10-28 NOTE — ED Triage Notes (Signed)
Pt arrives POV with his wife w/ c/o left lateral thigh pain beginning earlier today.  Pt states he was bowling when his left leg "gave out" on him.  He did not fall.  He is having a dull, constant pain.  Denies any numbness or tingling.  Has history of L3-5 fusion in 2004.

## 2021-10-28 NOTE — ED Provider Notes (Signed)
[facility] EMERGENCY DEPT Provider Note   CSN: [id number] Arrival date & time: 10/28/21  1615     History  Chief Complaint  Patient presents with   Leg Pain    Left    [name] [name] is a 71 y.o. male.  She has a past medical history of diabetes, dyslipidemia, hypertension, will fibrillation, COPD, first-degree heart block, history of rectal cancer, gastric ulcer, OSA, and osteoarthritis.  This patient presents the emergency department with a chief complaint of left thigh pain.  He states he was bowling and was halfway through the first game when he went up the bowl planted his left leg when it suddenly gave out and he had pain in the thigh area.  He was able to ambulate and walk without difficulty, however he continues to have left thigh pain on the anterior side.  He has no difficulty moving his left hip and left knee.  He has no swelling of the limb.  Pain is only present near the quadriceps muscle.  He has no numbness or tingling.   Leg Pain     Home Medications Prior to Admission medications   Medication Sig Start Date End Date Taking? Authorizing Provider  acetaminophen-codeine (TYLENOL #3) 300-30 MG tablet Take 1 tablet by mouth every 6 (six) hours as needed for up to 5 days for moderate pain. 10/28/21 11/02/21  Shon Indelicato, Adora Fridge, PA-C  amLODipine-olmesartan (AZOR) 10-40 MG tablet TAKE 1 TABLET BY MOUTH DAILY 01/05/21   Plotnikov, Evie Lacks, MD  amoxicillin-clavulanate (AUGMENTIN) 875-125 MG tablet Take 1 tablet by mouth every 12 (twelve) hours. Patient not taking: Reported on 08/30/2021 03/25/20   Loura Halt A, NP  aspirin EC 81 MG tablet Take 1 tablet (81 mg total) by mouth daily. 12/08/19   Plotnikov, Evie Lacks, MD  ASPIRIN LOW DOSE 81 MG chewable tablet Chew by mouth. 04/26/21   [provider]  blood glucose meter kit and supplies KIT Dispense based on patient and insurance preference. Use up to four times daily as directed. (FOR ICD-9 250.00, 250.01).  08/23/17   Plotnikov, Evie Lacks, MD  Carboxymethylcellulose Sodium (REFRESH LIQUIGEL OP) Apply 2 drops to eye as needed (for dry eyes).     [provider]  cetirizine (ZYRTEC) 10 MG tablet Take 1 tablet (10 mg total) by mouth daily. 03/22/20   Loura Halt A, NP  Colchicine 0.6 MG CAPS TAKE ONE CAPSULE BY MOUTH EVERY DAY AS NEEDED FOR GOUT 08/12/20   Plotnikov, Evie Lacks, MD  diclofenac Sodium (VOLTAREN) 1 % GEL APPLY TOPICALLY TO THE AFFECTED AREA FOUR TIMES DAILY 10/23/21   Plotnikov, Evie Lacks, MD  DUREZOL 0.05 % EMUL Apply to eye. 03/23/21   [provider]  fluticasone (FLONASE) 50 MCG/ACT nasal spray USE ONE SPRAY IN EACH NOSTRIL DAILY 09/22/19   Plotnikov, Evie Lacks, MD  furosemide (LASIX) 20 MG tablet Take 1-2 tablets (20-40 mg total) by mouth daily as needed for edema. 09/22/19   Plotnikov, Evie Lacks, MD  hydrochlorothiazide (HYDRODIURIL) 25 MG tablet Take 1 tablet (25 mg total) by mouth daily. 09/22/19   Plotnikov, Evie Lacks, MD  indomethacin (INDOCIN) 50 MG capsule TAKE 1 CAPSULE(50 MG) BY MOUTH THREE TIMES DAILY AS NEEDED FOR MODERATE PAIN OR GOUT 10/12/21   Plotnikov, Evie Lacks, MD  Iron-Vitamins (GERITOL COMPLETE PO) Take 1 tablet by mouth every morning. With Iron    [provider]  losartan (COZAAR) 100 MG tablet Take 100 mg by mouth daily. 04/26/21  [provider]  metFORMIN (GLUCOPHAGE) 850 MG tablet Take 1 tablet (850 mg total) by mouth 2 (two) times daily. 10/19/21   Plotnikov, Evie Lacks, MD  methylPREDNISolone (MEDROL DOSEPAK) 4 MG TBPK tablet As directed 08/30/21   Plotnikov, Evie Lacks, MD  metoprolol tartrate (LOPRESSOR) 50 MG tablet Take 1 tablet (50 mg total) by mouth daily. Follow-up appt due in Oct must see provider for future refills 09/22/19   Plotnikov, Evie Lacks, MD  moxifloxacin (VIGAMOX) 0.5 % ophthalmic solution Apply 1 drop to eye 4 (four) times daily. 03/28/21   [provider]  pantoprazole (PROTONIX) 20 MG tablet Take 1 tablet  (20 mg total) by mouth 2 (two) times daily before a meal. 09/22/19   Plotnikov, Evie Lacks, MD  pantoprazole (PROTONIX) 40 MG tablet Take 40 mg by mouth 2 (two) times daily. 04/26/21   [provider]  pioglitazone-metformin (ACTOPLUS MET) 15-850 MG tablet TAKE 1 TABLET BY MOUTH TWICE DAILY( EVERY TWELVE HOURS) 09/22/19   Plotnikov, Evie Lacks, MD  potassium chloride SA (KLOR-CON) 20 MEQ tablet TAKE 1 TABLET(20 MEQ) BY MOUTH DAILY 01/30/21   Plotnikov, Evie Lacks, MD  pravastatin (PRAVACHOL) 40 MG tablet TAKE ONE TABLET BY MOUTH EACH EVENING 09/22/19   Plotnikov, Evie Lacks, MD  tamsulosin (FLOMAX) 0.4 MG CAPS capsule Take 1 capsule (0.4 mg total) by mouth every morning. 09/22/19   Plotnikov, Evie Lacks, MD      Allergies    Benazepril hcl and Sildenafil    Review of Systems   Review of Systems  Musculoskeletal:  Positive for myalgias.  All other systems reviewed and are negative.  Physical Exam Updated Vital Signs BP (!) 150/79 (BP Location: Right Arm)    Pulse 77    Temp 98.8 F (37.1 C)    Resp 18    Ht '6\' 2"'  (1.88 m)    Wt 121.6 kg    SpO2 100%    BMI 34.42 kg/m  Physical Exam Vitals and nursing note reviewed.  Constitutional:      General: He is not in acute distress.    Appearance: Normal appearance. He is well-developed. He is not ill-appearing, toxic-appearing or diaphoretic.  HENT:     Head: Normocephalic and atraumatic.     Nose: No nasal deformity.     Mouth/Throat:     Lips: Pink. No lesions.  Eyes:     General: Gaze aligned appropriately. No scleral icterus.       Right eye: No discharge.        Left eye: No discharge.     Conjunctiva/sclera: Conjunctivae normal.     Right eye: Right conjunctiva is not injected. No exudate or hemorrhage.    Left eye: Left conjunctiva is not injected. No exudate or hemorrhage. Pulmonary:     Effort: Pulmonary effort is normal. No respiratory distress.  Musculoskeletal:     Comments: Patient has full range of motion of left hip and  left knee.  When bending these knees, he does not experience any pain in his left thigh.  Left thigh is not swollen any there is any other part of his left leg in comparison to the right leg.  He has no overlying erythema.  There is mild tenderness to the left anterior thigh. Patient has normal pedal pulses 2+ bilaterally.  He has normal sensation.  His capillary refill is intact.  Skin:    General: Skin is warm and dry.  Neurological:     Mental Status: He is alert  and oriented to person, place, and time.  Psychiatric:        Mood and Affect: Mood normal.        Speech: Speech normal.        Behavior: Behavior normal. Behavior is cooperative.    ED Results / Procedures / Treatments   Labs (all labs ordered are listed, but only abnormal results are displayed) Labs Reviewed - No data to display  EKG None  Radiology No results found.  Procedures Procedures   Medications Ordered in ED Medications  HYDROcodone-acetaminophen (NORCO/VICODIN) 5-325 MG per tablet 1 tablet (1 tablet Oral Given 10/28/21 2040)    ED Course/ Medical Decision Making/ A&P                           Medical Decision Making Problems Addressed: Left thigh pain: acute illness or injury  Amount and/or Complexity of Data Reviewed External Data Reviewed: notes.  Risk Prescription drug management.   This is a 71 y.o. male with a pertinent PMH of diabetes, dyslipidemia, hypertension, will fibrillation, COPD, first-degree heart block, history of rectal cancer, gastric ulcer, OSA, and osteoarthritis who presents to the ED with left quadriceps pain after planting on his left leg. He had sudden onset left quadriceps pain and feelings like his leg was going to give out. There is no radiating symptoms or lower back pain to suggest sciatica. No numbness or weakness of the extremity. No swelling to suggest DVT. Patient is ambulatory and tenderness is mild so I am doubtful of fracture.   Initial Impression: I think this  is likely MSK versus sciatica sx  Additional History:  Additional history obtained from wife External records from outside source obtained and reviewed including recent office visit for lower back pain Social Determinants of Health: n/a    Medications:   I ordered medication including Vicoden for pain Reevaluation of the patient after these medicines showed that the patient improved I have reviewed the patients home medicines and have made adjustments as needed   Disposition:  After consideration of the diagnostic results and the patients response to treatment, I feel that the patent would benefit from supportive tx.  I have discussed this patient with my attending physician, Dr. Tamera Punt who has made changes to the plan accordingly.  Portions of this note were generated with [employer]. Dictation errors may occur despite best attempts at proofreading.    Final Clinical Impression(s) / ED Diagnoses Final diagnoses:  Left thigh pain    Rx / DC Orders ED Discharge Orders          Ordered    acetaminophen-codeine (TYLENOL #3) 300-30 MG tablet  Every 6 hours PRN,   Status:  Discontinued        10/28/21 2029    acetaminophen-codeine (TYLENOL #3) 300-30 MG tablet  Every 6 hours PRN        10/28/21 2036              [name] Birchwood, PA-C 10/29/21 0051    Malvin Johns, MD 10/29/21 1501

## 2021-10-28 NOTE — Discharge Instructions (Signed)
This is likely musculoskeletal pain either from sciatica versus a muscular strain of your thigh. I have refilled your Tylenol 3. Please follow up with Raliegh Ip. You can use heating pad and stretching for your symptoms.

## 2021-10-30 DIAGNOSIS — M25552 Pain in left hip: Secondary | ICD-10-CM | POA: Diagnosis not present

## 2021-10-30 DIAGNOSIS — M25562 Pain in left knee: Secondary | ICD-10-CM | POA: Diagnosis not present

## 2021-10-30 DIAGNOSIS — M545 Low back pain, unspecified: Secondary | ICD-10-CM | POA: Diagnosis not present

## 2021-11-03 DIAGNOSIS — M25552 Pain in left hip: Secondary | ICD-10-CM | POA: Diagnosis not present

## 2021-11-13 DIAGNOSIS — E1151 Type 2 diabetes mellitus with diabetic peripheral angiopathy without gangrene: Secondary | ICD-10-CM | POA: Diagnosis not present

## 2021-11-13 DIAGNOSIS — B351 Tinea unguium: Secondary | ICD-10-CM | POA: Diagnosis not present

## 2021-11-20 ENCOUNTER — Ambulatory Visit (INDEPENDENT_AMBULATORY_CARE_PROVIDER_SITE_OTHER): Payer: Medicare PPO

## 2021-11-20 DIAGNOSIS — Z0000 Encounter for general adult medical examination without abnormal findings: Secondary | ICD-10-CM

## 2021-11-20 NOTE — Progress Notes (Addendum)
?I connected with [name] [name] today by telephone and verified that I am speaking with the correct person using two identifiers. ?Location patient: home ?Location provider: work ?Persons participating in the virtual visit: patient, provider. ?  ?I discussed the limitations, risks, security and privacy concerns of performing an evaluation and management service by telephone and the availability of in person appointments. I also discussed with the patient that there may be a patient responsible charge related to this service. The patient expressed understanding and verbally consented to this telephonic visit.  ?  ?Interactive audio and video telecommunications were attempted between this provider and patient, however failed, due to patient having technical difficulties OR patient did not have access to video capability.  We continued and completed visit with audio only. ? ?Some vital signs may be absent or patient reported.  ? ?Time Spent with patient on telephone encounter: 40 minutes ? ?Subjective:  ? [name] [name] is a 71 y.o. male who presents for Medicare Annual/Subsequent preventive examination. ? ?Review of Systems    ? ?Cardiac Risk Factors include: advanced age (>39mn, >>35women);diabetes mellitus;dyslipidemia;hypertension;male gender ? ?   ?Objective:  ?  ?There were no vitals filed for this visit. ?There is no height or weight on file to calculate BMI. ? ?Advanced Directives 11/20/2021 10/28/2021 08/25/2018 08/23/2017 10/03/2016 08/02/2016 06/22/2016  ?Does Patient Have a Medical Advance Directive? Yes Yes Yes Yes Yes Yes Yes  ?Type of Advance Directive Living will;Healthcare Power of [place] will [place]Living will Living will  ?Does patient want to make changes to medical advance directive? No - Patient declined - - - - - No -  Patient declined  ?Copy of [place] Chart? No - copy requested - No - copy requested No - copy requested - - No - copy requested  ?Would patient like information on creating a medical advance directive? - - - - - - -  ? ? ?Current Medications (verified) ?Outpatient Encounter Medications as of 11/20/2021  ?Medication Sig  ? amLODipine-olmesartan (AZOR) 10-40 MG tablet TAKE 1 TABLET BY MOUTH DAILY  ? amoxicillin-clavulanate (AUGMENTIN) 875-125 MG tablet Take 1 tablet by mouth every 12 (twelve) hours. (Patient not taking: Reported on 08/30/2021)  ? aspirin EC 81 MG tablet Take 1 tablet (81 mg total) by mouth daily.  ? ASPIRIN LOW DOSE 81 MG chewable tablet Chew by mouth.  ? blood glucose meter kit and supplies KIT Dispense based on patient and insurance preference. Use up to four times daily as directed. (FOR ICD-9 250.00, 250.01).  ? Carboxymethylcellulose Sodium (REFRESH LIQUIGEL OP) Apply 2 drops to eye as needed (for dry eyes).   ? cetirizine (ZYRTEC) 10 MG tablet Take 1 tablet (10 mg total) by mouth daily.  ? Colchicine 0.6 MG CAPS TAKE ONE CAPSULE BY MOUTH EVERY DAY AS NEEDED FOR GOUT  ? diclofenac Sodium (VOLTAREN) 1 % GEL APPLY TOPICALLY TO THE AFFECTED AREA FOUR TIMES DAILY  ? DUREZOL 0.05 % EMUL Apply to eye.  ? fluticasone (FLONASE) 50 MCG/ACT nasal spray USE ONE SPRAY IN EACH NOSTRIL DAILY  ? furosemide (LASIX) 20 MG tablet Take 1-2 tablets (20-40 mg total) by mouth daily as needed for edema.  ? hydrochlorothiazide (HYDRODIURIL) 25 MG tablet Take 1 tablet (25 mg total) by mouth daily.  ? indomethacin (INDOCIN) 50 MG capsule TAKE 1 CAPSULE(50 MG) BY MOUTH THREE TIMES DAILY  AS NEEDED FOR MODERATE PAIN OR GOUT  ? Iron-Vitamins (GERITOL COMPLETE PO) Take 1 tablet by mouth every morning. With Iron  ? losartan (COZAAR) 100 MG tablet Take 100 mg by mouth daily.  ? metFORMIN (GLUCOPHAGE) 850 MG tablet Take 1 tablet (850 mg total) by mouth 2 (two) times daily.  ? methylPREDNISolone (MEDROL  DOSEPAK) 4 MG TBPK tablet As directed  ? metoprolol tartrate (LOPRESSOR) 50 MG tablet Take 1 tablet (50 mg total) by mouth daily. Follow-up appt due in Oct must see provider for future refills  ? moxifloxacin (VIGAMOX) 0.5 % ophthalmic solution Apply 1 drop to eye 4 (four) times daily.  ? pantoprazole (PROTONIX) 20 MG tablet Take 1 tablet (20 mg total) by mouth 2 (two) times daily before a meal.  ? pantoprazole (PROTONIX) 40 MG tablet Take 40 mg by mouth 2 (two) times daily.  ? pioglitazone-metformin (ACTOPLUS MET) 15-850 MG tablet TAKE 1 TABLET BY MOUTH TWICE DAILY( EVERY TWELVE HOURS)  ? potassium chloride SA (KLOR-CON) 20 MEQ tablet TAKE 1 TABLET(20 MEQ) BY MOUTH DAILY  ? pravastatin (PRAVACHOL) 40 MG tablet TAKE ONE TABLET BY MOUTH EACH EVENING  ? tamsulosin (FLOMAX) 0.4 MG CAPS capsule Take 1 capsule (0.4 mg total) by mouth every morning.  ? ?No facility-administered encounter medications on file as of 11/20/2021.  ? ? ?Allergies (verified) ?Benazepril hcl and Sildenafil  ? ?History: ?Past Medical History:  ?Diagnosis Date  ? Allergy   ? Bilateral renal cysts   ? benign/ Patient denies  ? COPD (chronic obstructive pulmonary disease) ([place])   ?  PFT 2010 due to previous 20y h/o smoking  ? DDD (degenerative disc disease), lumbar   ? Diabetes mellitus type II   ? ED (erectile dysfunction) of organic origin   ? First degree heart block   ? History of colon polyps   ? History of prostate cancer current PSA per pt 0.13  ? dx March 2014---  s/p radiactive prostate seed implants and intraop radiotherapy in [place], [place]  ? Hyperlipidemia   ? Hypertension   ? Liver mass 06/2013  ? OVER HALF OF LIVER REMOVED/ BENIGN  ? OSA on CPAP   ? Osteoarthritis   ? PAF (paroxysmal atrial fibrillation) ([place])   ? cardiologist--  dr hochrein  (episode prior to liver surgery at [facility] 2014 w/ post-op RVR)  ? Prostate cancer [facility]) 11-11-2012  ? s/p chemo, radiation   ? Rectal cancer ([place])   ? 01-25-2015----per pathology --  complete  rectal polypectomy-- showed  invasive carcinoma rising in tubular adnemoa high grade hyperplasia--  per dr Ardis Hughs per MRI  no other area seen -- will have colonoscopy done in 3 months  ? Sleep apnea   ? wears cpap  ? Ulcer   ? STOMACH ULCER  ? Wears glasses   ? ?Past Surgical History:  ?Procedure Laterality Date  ?  RIGHT LOBE HEPATECTOMY  06-12-2013    [facility]  ? benign mass  ? CARDIOVASCULAR STRESS TEST  10-01-2014   dr hochrein  ? normal perfusion study, no ischemia,  normal LV function and wall motion, ef 70%  ? COLONOSCOPY    ? ESOPHAGOGASTRODUODENOSCOPY N/A 06/23/2016  ? Procedure: ESOPHAGOGASTRODUODENOSCOPY (EGD);  Surgeon: Manus Gunning, MD;  Location: [place];  Service: Gastroenterology;  Laterality: N/A;  ? EUS N/A 02/10/2015  ? Procedure: LOWER ENDOSCOPIC ULTRASOUND (EUS);  Surgeon: Milus Banister, MD;  Location: [facility];  Service: Endoscopy;  Laterality: N/A;  ? KNEE ARTHROSCOPY Bilateral  1990's  ? LUMBAR FUSION  2001  ? PENILE PROSTHESIS IMPLANT N/A 04/25/2015  ? Procedure: PENILE PROSTHESIS THREE PIECE INFLATABLE( COLOPLAST) SCROTAL APPROACH;  Surgeon: Kathie Rhodes, MD;  Location: [place];  Service: Urology;  Laterality: N/A;  ? POLYPECTOMY    ? RADIOACTIVE PROSTATE SEED IMPLANTS  May 2014  in [place], [place] 02/21/2016  ? TOTAL KNEE ARTHROPLASTY Left 06/2012  ? TOTAL KNEE ARTHROPLASTY Right 2017  ? TRANSTHORACIC ECHOCARDIOGRAM  12/26/2012  ? mild focal basal septal hypertrophy,  grade II diastolic dysfunction,  ef 55-60%,  mild LAE,  trivial MR and TR  ? UPPER GASTROINTESTINAL ENDOSCOPY    ? VASECTOMY  1980's w/ gen. anes.  ? ?Family History  ?Problem Relation Age of Onset  ? Diabetes Mother   ? Heart disease Mother   ?     Pacemaker  ? Pancreatic cancer Father   ? Stomach cancer Father   ? Stomach cancer Sister   ? Pancreatic cancer Sister   ? Hypertension Other   ? Colon polyps Neg Hx   ? Rectal cancer Neg Hx   ? Esophageal cancer  Neg Hx   ? Colon cancer Neg Hx   ? ?Social History  ? ?Socioeconomic History  ? Marital status: Married  ?  Spouse name: Not on file  ? Number of children: 2  ? Years of education: Not on file  ? Highest education l

## 2021-11-20 NOTE — Patient Instructions (Signed)
Mr. Kaigler , ?Thank you for taking time to come for your Medicare Wellness Visit. I appreciate your ongoing commitment to your health goals. Please review the following plan we discussed and let me know if I can assist you in the future.  ? ?Screening recommendations/referrals: ?Colonoscopy: 11/29/2017; due every 5 years ?Recommended yearly ophthalmology/optometry visit for glaucoma screening and checkup ?Recommended yearly dental visit for hygiene and checkup ? ?Vaccinations: ?Influenza vaccine: Fall  ?Pneumococcal vaccine: 06/23/2016, 10/29/2016 ?Tdap vaccine: 12/27/2017; due every 10 years ?Shingles vaccine: 02/27/2018, 05/06/2018, 05/07/2019   ?Covid-19: 09/28/2019, 10/19/2019, 9/292021, 12/11/2020, 05/26/2021 ? ?Advanced directives: Please bring a copy of your health care power of attorney and living will to the office at your convenience. ? ?Conditions/risks identified: Yes; Client understands the importance of follow-up appointments with providers by attending scheduled visits and discussed goals to eat healthier, increase physical activity 5 times a week for 30 minutes each, exercise the brain by doing stimulating brain exercises (reading, adult coloring, crafting, listening to music, puzzles, etc.), socialize and enjoy life more, get enough sleep at least 8-9 hours average per night and make time for laughter. ? ?Next appointment: Please schedule your next Medicare Wellness Visit with your Nurse Health Advisor in 1 year by calling [phone]. ? ?Preventive Care 72 Years and Older, Male ?Preventive care refers to lifestyle choices and visits with your health care provider that can promote health and wellness. ?What does preventive care include? ?A yearly physical exam. This is also called an annual well check. ?Dental exams once or twice a year. ?Routine eye exams. Ask your health care provider how often you should have your eyes checked. ?Personal lifestyle choices, including: ?Daily care of your teeth and  gums. ?Regular physical activity. ?Eating a healthy diet. ?Avoiding tobacco and drug use. ?Limiting alcohol use. ?Practicing safe sex. ?Taking low doses of aspirin every day. ?Taking vitamin and mineral supplements as recommended by your health care provider. ?What happens during an annual well check? ?The services and screenings done by your health care provider during your annual well check will depend on your age, overall health, lifestyle risk factors, and family history of disease. ?Counseling  ?Your health care provider may ask you questions about your: ?Alcohol use. ?Tobacco use. ?Drug use. ?Emotional well-being. ?Home and relationship well-being. ?Sexual activity. ?Eating habits. ?History of falls. ?Memory and ability to understand (cognition). ?Work and work Statistician. ?Screening  ?You may have the following tests or measurements: ?Height, weight, and BMI. ?Blood pressure. ?Lipid and cholesterol levels. These may be checked every 5 years, or more frequently if you are over 90 years old. ?Skin check. ?Lung cancer screening. You may have this screening every year starting at age 86 if you have a 30-pack-year history of smoking and currently smoke or have quit within the past 15 years. ?Fecal occult blood test (FOBT) of the stool. You may have this test every year starting at age 59. ?Flexible sigmoidoscopy or colonoscopy. You may have a sigmoidoscopy every 5 years or a colonoscopy every 10 years starting at age 79. ?Prostate cancer screening. Recommendations will vary depending on your family history and other risks. ?Hepatitis C blood test. ?Hepatitis B blood test. ?Sexually transmitted disease (STD) testing. ?Diabetes screening. This is done by checking your blood sugar (glucose) after you have not eaten for a while (fasting). You may have this done every 1-3 years. ?Abdominal aortic aneurysm (AAA) screening. You may need this if you are a current or former smoker. ?Osteoporosis. You may be screened  starting at age 59 if you are at high risk. ?Talk with your health care provider about your test results, treatment options, and if necessary, the need for more tests. ?Vaccines  ?Your health care provider may recommend certain vaccines, such as: ?Influenza vaccine. This is recommended every year. ?Tetanus, diphtheria, and acellular pertussis (Tdap, Td) vaccine. You may need a Td booster every 10 years. ?Zoster vaccine. You may need this after age 7. ?Pneumococcal 13-valent conjugate (PCV13) vaccine. One dose is recommended after age 47. ?Pneumococcal polysaccharide (PPSV23) vaccine. One dose is recommended after age 31. ?Talk to your health care provider about which screenings and vaccines you need and how often you need them. ?This information is not intended to replace advice given to you by your health care provider. Make sure you discuss any questions you have with your health care provider. ?Document Released: 09/16/2015 Document Revised: 05/09/2016 Document Reviewed: 06/21/2015 ?Elsevier Interactive Patient Education ? 2017 [place]. ? ?Fall Prevention in the Home ?Falls can cause injuries. They can happen to people of all ages. There are many things you can do to make your home safe and to help prevent falls. ?What can I do on the outside of my home? ?Regularly fix the edges of walkways and driveways and fix any cracks. ?Remove anything that might make you trip as you walk through a door, such as a raised step or threshold. ?Trim any bushes or trees on the path to your home. ?Use bright outdoor lighting. ?Clear any walking paths of anything that might make someone trip, such as rocks or tools. ?Regularly check to see if handrails are loose or broken. Make sure that both sides of any steps have handrails. ?Any raised decks and porches should have guardrails on the edges. ?Have any leaves, snow, or ice cleared regularly. ?Use sand or salt on walking paths during winter. ?Clean up any spills in your garage  right away. This includes oil or grease spills. ?What can I do in the bathroom? ?Use night lights. ?Install grab bars by the toilet and in the tub and shower. Do not use towel bars as grab bars. ?Use non-skid mats or decals in the tub or shower. ?If you need to sit down in the shower, use a plastic, non-slip stool. ?Keep the floor dry. Clean up any water that spills on the floor as soon as it happens. ?Remove soap buildup in the tub or shower regularly. ?Attach bath mats securely with double-sided non-slip rug tape. ?Do not have throw rugs and other things on the floor that can make you trip. ?What can I do in the bedroom? ?Use night lights. ?Make sure that you have a light by your bed that is easy to reach. ?Do not use any sheets or blankets that are too big for your bed. They should not hang down onto the floor. ?Have a firm chair that has side arms. You can use this for support while you get dressed. ?Do not have throw rugs and other things on the floor that can make you trip. ?What can I do in the kitchen? ?Clean up any spills right away. ?Avoid walking on wet floors. ?Keep items that you use a lot in easy-to-reach places. ?If you need to reach something above you, use a strong step stool that has a grab bar. ?Keep electrical cords out of the way. ?Do not use floor polish or wax that makes floors slippery. If you must use wax, use non-skid floor wax. ?Do not have throw rugs  and other things on the floor that can make you trip. ?What can I do with my stairs? ?Do not leave any items on the stairs. ?Make sure that there are handrails on both sides of the stairs and use them. Fix handrails that are broken or loose. Make sure that handrails are as long as the stairways. ?Check any carpeting to make sure that it is firmly attached to the stairs. Fix any carpet that is loose or worn. ?Avoid having throw rugs at the top or bottom of the stairs. If you do have throw rugs, attach them to the floor with carpet tape. ?Make  sure that you have a light switch at the top of the stairs and the bottom of the stairs. If you do not have them, ask someone to add them for you. ?What else can I do to help prevent falls? ?Wear shoes that: ?Do

## 2021-11-21 ENCOUNTER — Telehealth: Payer: Self-pay

## 2021-11-21 NOTE — Telephone Encounter (Signed)
Patient would like to know should he be taking the following: Actos-Metformin 15-'850mg'$  and Metformin '850mg'$  BID.  He said that his glucose is running around 105 in the morning.  Not sure if he is taking too much or not.  CB# [phone] ?

## 2021-11-23 NOTE — Telephone Encounter (Signed)
It should be Actos plus met 15-850 1 tablet twice a day.  Thank you ?

## 2021-11-28 ENCOUNTER — Other Ambulatory Visit: Payer: Self-pay

## 2021-11-28 ENCOUNTER — Telehealth: Payer: Self-pay

## 2021-11-28 MED ORDER — PIOGLITAZONE HCL-METFORMIN HCL 15-850 MG PO TABS: ORAL_TABLET | ORAL | 3 refills | Status: DC

## 2021-11-28 NOTE — Telephone Encounter (Addendum)
Pt is requesting a refill on: ?pioglitazone-metformin (ACTOPLUS MET) 15-850 MG tablet ? ?Pharmacy ?[id number] - [place], [place] - [address] ? ?LOV 08/30/21 ?ROV 12/12/21 ?

## 2021-12-06 ENCOUNTER — Other Ambulatory Visit: Payer: Self-pay | Admitting: Internal Medicine

## 2021-12-12 ENCOUNTER — Encounter: Payer: Self-pay | Admitting: Internal Medicine

## 2021-12-12 ENCOUNTER — Ambulatory Visit (INDEPENDENT_AMBULATORY_CARE_PROVIDER_SITE_OTHER): Payer: Medicare PPO | Admitting: Internal Medicine

## 2021-12-12 VITALS — BP 126/80 | HR 71 | Temp 97.8°F | Ht 74.0 in | Wt 277.4 lb

## 2021-12-12 DIAGNOSIS — M25511 Pain in right shoulder: Secondary | ICD-10-CM

## 2021-12-12 DIAGNOSIS — Z9989 Dependence on other enabling machines and devices: Secondary | ICD-10-CM

## 2021-12-12 DIAGNOSIS — E785 Hyperlipidemia, unspecified: Secondary | ICD-10-CM | POA: Diagnosis not present

## 2021-12-12 DIAGNOSIS — I10 Essential (primary) hypertension: Secondary | ICD-10-CM | POA: Diagnosis not present

## 2021-12-12 DIAGNOSIS — C61 Malignant neoplasm of prostate: Secondary | ICD-10-CM | POA: Diagnosis not present

## 2021-12-12 DIAGNOSIS — G4733 Obstructive sleep apnea (adult) (pediatric): Secondary | ICD-10-CM | POA: Diagnosis not present

## 2021-12-12 DIAGNOSIS — E118 Type 2 diabetes mellitus with unspecified complications: Secondary | ICD-10-CM | POA: Diagnosis not present

## 2021-12-12 LAB — COMPREHENSIVE METABOLIC PANEL
ALT: 16 U/L (ref 0–53)
AST: 14 U/L (ref 0–37)
Albumin: 4 g/dL (ref 3.5–5.2)
Alkaline Phosphatase: 85 U/L (ref 39–117)
BUN: 15 mg/dL (ref 6–23)
CO2: 32 mEq/L (ref 19–32)
Calcium: 9.8 mg/dL (ref 8.4–10.5)
Chloride: 99 mEq/L (ref 96–112)
Creatinine, Ser: 1.09 mg/dL (ref 0.40–1.50)
GFR: 67.93 mL/min (ref >60.00)
Glucose, Bld: 163 mg/dL — ABNORMAL HIGH (ref 70–99)
Potassium: 3.5 mEq/L (ref 3.5–5.1)
Sodium: 138 mEq/L (ref 135–145)
Total Bilirubin: 0.9 mg/dL (ref 0.2–1.2)
Total Protein: 7.4 g/dL (ref 6.0–8.3)

## 2021-12-12 LAB — URINALYSIS
Bilirubin Urine: NEGATIVE
Hgb urine dipstick: NEGATIVE
Ketones, ur: NEGATIVE
Leukocytes,Ua: NEGATIVE
Nitrite: NEGATIVE
Specific Gravity, Urine: 1.015 (ref 1.000–1.030)
Total Protein, Urine: NEGATIVE
Urine Glucose: NEGATIVE
Urobilinogen, UA: 0.2 (ref 0.0–1.0)
pH: 7.5 (ref 5.0–8.0)

## 2021-12-12 LAB — CBC WITH DIFFERENTIAL/PLATELET
Basophils Absolute: 0 10*3/uL (ref 0.0–0.1)
Basophils Relative: 0.4 % (ref 0.0–3.0)
Eosinophils Absolute: 0.1 10*3/uL (ref 0.0–0.7)
Eosinophils Relative: 1.4 % (ref 0.0–5.0)
HCT: 43.7 % (ref 39.0–52.0)
Hemoglobin: 14.7 g/dL (ref 13.0–17.0)
Lymphocytes Relative: 24.4 % (ref 12.0–46.0)
Lymphs Abs: 1.6 10*3/uL (ref 0.7–4.0)
MCHC: 33.5 g/dL (ref 30.0–36.0)
MCV: 89.3 fl (ref 78.0–100.0)
Monocytes Absolute: 0.4 10*3/uL (ref 0.1–1.0)
Monocytes Relative: 6.8 % (ref 3.0–12.0)
Neutro Abs: 4.4 10*3/uL (ref 1.4–7.7)
Neutrophils Relative %: 67 % (ref 43.0–77.0)
Platelets: 322 10*3/uL (ref 150.0–400.0)
RBC: 4.89 Mil/uL (ref 4.22–5.81)
RDW: 13.2 % (ref 11.5–15.5)
WBC: 6.6 10*3/uL (ref 4.0–10.5)

## 2021-12-12 LAB — LIPID PANEL
Cholesterol: 213 mg/dL — ABNORMAL HIGH (ref 0–200)
HDL: 48.9 mg/dL (ref >39.00)
LDL Cholesterol: 141 mg/dL — ABNORMAL HIGH (ref 0–99)
NonHDL: 163.76
Total CHOL/HDL Ratio: 4
Triglycerides: 113 mg/dL (ref 0.0–149.0)
VLDL: 22.6 mg/dL (ref 0.0–40.0)

## 2021-12-12 LAB — HEMOGLOBIN A1C: Hgb A1c MFr Bld: 8.3 % — ABNORMAL HIGH (ref 4.6–6.5)

## 2021-12-12 LAB — TSH: TSH: 2.64 u[IU]/mL (ref 0.35–5.50)

## 2021-12-12 MED ORDER — LIDOCAINE HCL 2 % IJ SOLN
20.0000 mL | Freq: Once | INTRAMUSCULAR | Status: AC
  Administered 2021-12-12: 400 mg

## 2021-12-12 MED ORDER — PRAVASTATIN SODIUM 40 MG PO TABS: 40.0000 mg | ORAL_TABLET | Freq: Every day | ORAL | 3 refills | Status: AC

## 2021-12-12 MED ORDER — AMLODIPINE-OLMESARTAN 10-40 MG PO TABS: 1.0000 | ORAL_TABLET | Freq: Every day | ORAL | 3 refills | Status: DC

## 2021-12-12 MED ORDER — PIOGLITAZONE HCL-METFORMIN HCL 15-850 MG PO TABS: ORAL_TABLET | ORAL | 3 refills | Status: DC

## 2021-12-12 MED ORDER — METOPROLOL TARTRATE 50 MG PO TABS
50.0000 mg | ORAL_TABLET | Freq: Every day | ORAL | 3 refills | Status: DC
  Filled 2022-07-13: qty 90, 90d supply, fill #0

## 2021-12-12 MED ORDER — HYDROCHLOROTHIAZIDE 25 MG PO TABS
25.0000 mg | ORAL_TABLET | Freq: Every day | ORAL | 3 refills | Status: DC
  Filled 2022-07-13 – 2022-09-19 (×2): qty 90, 90d supply, fill #0

## 2021-12-12 MED ORDER — POTASSIUM CHLORIDE CRYS ER 20 MEQ PO TBCR: EXTENDED_RELEASE_TABLET | ORAL | 3 refills | Status: AC

## 2021-12-12 MED ORDER — TAMSULOSIN HCL 0.4 MG PO CAPS: 0.4000 mg | ORAL_CAPSULE | Freq: Every morning | ORAL | 3 refills | Status: AC

## 2021-12-12 MED ORDER — METHYLPREDNISOLONE ACETATE 40 MG/ML IJ SUSP
40.0000 mg | Freq: Once | INTRAMUSCULAR | Status: AC
  Administered 2021-12-12: 40 mg via INTRAMUSCULAR

## 2021-12-12 MED ORDER — PANTOPRAZOLE SODIUM 40 MG PO TBEC
40.0000 mg | DELAYED_RELEASE_TABLET | Freq: Two times a day (BID) | ORAL | 3 refills | Status: DC
Start: 2021-12-12 — End: 2023-03-18
  Filled 2022-06-15 – 2022-09-19 (×3): qty 180, 90d supply, fill #0

## 2021-12-12 NOTE — Assessment & Plan Note (Signed)
On Pravachol 40 mg/d ?

## 2021-12-12 NOTE — Assessment & Plan Note (Signed)
PSA was 0 ?

## 2021-12-12 NOTE — Assessment & Plan Note (Signed)
Doing well - [facility] ?

## 2021-12-12 NOTE — Patient Instructions (Signed)
For a mild COVID-19 case - take zinc 50 mg a day for 1 week, vitamin C 1000 mg daily for 1 week, vitamin D2 50,000 units weekly for 2 months (unless  taking vitamin D daily already), an antioxidant Quercetin 500 mg twice a day for 1 week (if you can get it quick enough). Take Allegra or Benadryl.  Maintain good oral hydration and take Tylenol for high fever.  Call if problems. Isolate for 5 days, then wear a mask for 5 days per CDC.  

## 2021-12-12 NOTE — Assessment & Plan Note (Addendum)
Worse ?Cont on Actos/Metformin ?Ozempic option discussed ?Check labs ?

## 2021-12-12 NOTE — Progress Notes (Addendum)
? ?Subjective:  ?Patient ID: [name] [name], male    DOB: 11-02-1950  Age: 71 y.o. MRN: [id number] ? ?CC: Follow-up (No concerns. ) ? ? ?HPI ?[name] [name] presents for DM, HTN, GERD f/u ?C/o R AC joint pain - worse ? ?Outpatient Medications Prior to Visit  ?Medication Sig Dispense Refill  ? ASPIRIN LOW DOSE 81 MG chewable tablet Chew by mouth.    ? blood glucose meter kit and supplies KIT Dispense based on patient and insurance preference. Use up to four times daily as directed. (FOR ICD-9 250.00, 250.01). 1 each 0  ? diclofenac Sodium (VOLTAREN) 1 % GEL APPLY TOPICALLY TO THE AFFECTED AREA FOUR TIMES DAILY 100 g 0  ? fluticasone (FLONASE) 50 MCG/ACT nasal spray USE ONE SPRAY IN EACH NOSTRIL DAILY 48 g 3  ? furosemide (LASIX) 20 MG tablet Take 1-2 tablets (20-40 mg total) by mouth daily as needed for edema. 180 tablet 3  ? indomethacin (INDOCIN) 50 MG capsule TAKE 1 CAPSULE(50 MG) BY MOUTH THREE TIMES DAILY AS NEEDED FOR MODERATE PAIN OR GOUT 60 capsule 3  ? Iron-Vitamins (GERITOL COMPLETE PO) Take 1 tablet by mouth every morning. With Iron    ? amLODipine-olmesartan (AZOR) 10-40 MG tablet TAKE 1 TABLET BY MOUTH DAILY 90 tablet 1  ? hydrochlorothiazide (HYDRODIURIL) 25 MG tablet Take 1 tablet (25 mg total) by mouth daily. 90 tablet 3  ? metoprolol tartrate (LOPRESSOR) 50 MG tablet Take 1 tablet (50 mg total) by mouth daily. Follow-up appt due in Oct must see provider for future refills 90 tablet 3  ? pantoprazole (PROTONIX) 40 MG tablet Take 40 mg by mouth 2 (two) times daily.    ? pioglitazone-metformin (ACTOPLUS MET) 15-850 MG tablet TAKE 1 TABLET BY MOUTH TWICE DAILY( EVERY TWELVE HOURS) 180 tablet 3  ? potassium chloride SA (KLOR-CON) 20 MEQ tablet TAKE 1 TABLET(20 MEQ) BY MOUTH DAILY 90 tablet 3  ? tamsulosin (FLOMAX) 0.4 MG CAPS capsule Take 1 capsule (0.4 mg total) by mouth every morning. 90 capsule 3  ? amoxicillin-clavulanate (AUGMENTIN) 875-125 MG tablet Take 1 tablet by mouth every 12 (twelve)  hours. (Patient not taking: Reported on 08/30/2021) 14 tablet 0  ? aspirin EC 81 MG tablet Take 1 tablet (81 mg total) by mouth daily. 100 tablet 3  ? Carboxymethylcellulose Sodium (REFRESH LIQUIGEL OP) Apply 2 drops to eye as needed (for dry eyes).     ? cetirizine (ZYRTEC) 10 MG tablet Take 1 tablet (10 mg total) by mouth daily. 30 tablet 0  ? Colchicine 0.6 MG CAPS TAKE ONE CAPSULE BY MOUTH EVERY DAY AS NEEDED FOR GOUT 90 capsule 0  ? DUREZOL 0.05 % EMUL Apply to eye.    ? losartan (COZAAR) 100 MG tablet Take 100 mg by mouth daily.    ? metFORMIN (GLUCOPHAGE) 850 MG tablet Take 1 tablet (850 mg total) by mouth 2 (two) times daily. 180 tablet 3  ? methylPREDNISolone (MEDROL DOSEPAK) 4 MG TBPK tablet As directed 21 tablet 0  ? moxifloxacin (VIGAMOX) 0.5 % ophthalmic solution Apply 1 drop to eye 4 (four) times daily.    ? pantoprazole (PROTONIX) 20 MG tablet Take 1 tablet (20 mg total) by mouth 2 (two) times daily before a meal. 180 tablet 3  ? pravastatin (PRAVACHOL) 40 MG tablet TAKE ONE TABLET BY MOUTH EACH EVENING 90 tablet 3  ? ?No facility-administered medications prior to visit.  ? ? ?ROS: ?Review of Systems  ?Constitutional:  Negative for appetite change, fatigue  and unexpected weight change.  ?HENT:  Negative for congestion, nosebleeds, sneezing, sore throat and trouble swallowing.   ?Eyes:  Negative for itching and visual disturbance.  ?Respiratory:  Negative for cough.   ?Cardiovascular:  Negative for chest pain, palpitations and leg swelling.  ?Gastrointestinal:  Negative for abdominal distention, blood in stool, diarrhea and nausea.  ?Genitourinary:  Negative for frequency and hematuria.  ?Musculoskeletal:  Negative for back pain, gait problem, joint swelling and neck pain.  ?Skin:  Negative for rash.  ?Neurological:  Negative for dizziness, tremors, speech difficulty and weakness.  ?Psychiatric/Behavioral:  Negative for agitation, dysphoric mood and sleep disturbance. The patient is not  nervous/anxious.   ? ?Objective:  ?BP 126/80   Pulse 71   Temp 97.8 ?F (36.6 ?C) (Oral)   Ht 6' 2" (1.88 m)   Wt 277 lb 6.4 oz (125.8 kg)   SpO2 97%   BMI 35.62 kg/m?  ? ?BP Readings from Last 3 Encounters:  ?12/12/21 126/80  ?10/28/21 (!) 150/79  ?09/09/20 122/70  ? ? ?Wt Readings from Last 3 Encounters:  ?12/12/21 277 lb 6.4 oz (125.8 kg)  ?10/28/21 268 lb 1.3 oz (121.6 kg)  ?09/09/20 269 lb (122 kg)  ? ? ?Physical Exam ?Constitutional:   ?   General: He is not in acute distress. ?   Appearance: He is well-developed.  ?   Comments: NAD  ?Eyes:  ?   Conjunctiva/sclera: Conjunctivae normal.  ?   Pupils: Pupils are equal, round, and reactive to light.  ?Neck:  ?   Thyroid: No thyromegaly.  ?   Vascular: No JVD.  ?Cardiovascular:  ?   Rate and Rhythm: Normal rate and regular rhythm.  ?   Heart sounds: Normal heart sounds. No murmur heard. ?  No friction rub. No gallop.  ?Pulmonary:  ?   Effort: Pulmonary effort is normal. No respiratory distress.  ?   Breath sounds: Normal breath sounds. No wheezing or rales.  ?Chest:  ?   Chest wall: No tenderness.  ?Abdominal:  ?   General: Bowel sounds are normal. There is no distension.  ?   Palpations: Abdomen is soft. There is no mass.  ?   Tenderness: There is no abdominal tenderness. There is no guarding or rebound.  ?Musculoskeletal:     ?   General: No tenderness. Normal range of motion.  ?   Cervical back: Normal range of motion.  ?Lymphadenopathy:  ?   Cervical: No cervical adenopathy.  ?Skin: ?   General: Skin is warm and dry.  ?   Findings: No rash.  ?Neurological:  ?   Mental Status: He is alert and oriented to person, place, and time.  ?   Cranial Nerves: No cranial nerve deficit.  ?   Motor: No abnormal muscle tone.  ?   Coordination: Coordination normal.  ?   Gait: Gait normal.  ?   Deep Tendon Reflexes: Reflexes are normal and symmetric.  ?Psychiatric:     ?   Behavior: Behavior normal.     ?   Thought Content: Thought content normal.     ?   Judgment:  Judgment normal.  ? ?R AC w/pain on palpation and w/ROM ? ? ? ? Procedure :Joint Injection,  R AC joint ? ? Indication: AC OA with refractory  chronic pain. ? ? Risks including unsuccessful procedure , bleeding, infection, bruising, skin atrophy, "steroid flare-up" and others were explained to the patient in detail as well as the benefits. Informed  consent was obtained and signed.  ? ?Tthe patient was placed in a comfortable position. Vertical approach was used. Skin was prepped with Betadine and alcohol  and anesthetized with a cooling spray. Then, a 3 cc syringe with a 2 inch long 24-gauge needle was used for a joint injection.. The needle was advanced  Into the [facility] space and was injected with 1 mL of 2% lidocaine and 20 mg of Depo-Medrol .  Band-Aid was applied. ? ? Tolerated well. Complications: None. Good pain relief following the procedure. ? ? Postprocedure instructions :  ? ? A Band-Aid should be left on for 12 hours. Injection therapy is not a cure itself. It is used in conjunction with other modalities. You can use nonsteroidal anti-inflammatories like ibuprofen , hot and cold compresses. Rest is recommended in the next 24 hours. You need to report immediately  if fever, chills or any signs of infection develop. ? ?Lab Results  ?Component Value Date  ? WBC 6.6 12/12/2021  ? HGB 14.7 12/12/2021  ? HCT 43.7 12/12/2021  ? PLT 322.0 12/12/2021  ? GLUCOSE 163 (H) 12/12/2021  ? CHOL 213 (H) 12/12/2021  ? TRIG 113.0 12/12/2021  ? HDL 48.90 12/12/2021  ? LDLDIRECT 125.4 07/14/2008  ? LDLCALC 141 (H) 12/12/2021  ? ALT 16 12/12/2021  ? AST 14 12/12/2021  ? NA 138 12/12/2021  ? K 3.5 12/12/2021  ? CL 99 12/12/2021  ? CREATININE 1.09 12/12/2021  ? BUN 15 12/12/2021  ? CO2 32 12/12/2021  ? TSH 2.64 12/12/2021  ? PSA <0.1 03/10/2020  ? INR 1.14 06/23/2016  ? HGBA1C 8.3 (H) 12/12/2021  ? MICROALBUR 0.3 03/10/2020  ? ? ?No results found. ? ?Assessment & Plan:  ? ?Problem List Items Addressed This Visit   ? ? Essential  hypertension  ? Relevant Medications  ? amLODipine-olmesartan (AZOR) 10-40 MG tablet  ? hydrochlorothiazide (HYDRODIURIL) 25 MG tablet  ? metoprolol tartrate (LOPRESSOR) 50 MG tablet  ? pravastatin (PRAVACHOL) 40 MG tablet  ? Other Rele

## 2021-12-12 NOTE — Assessment & Plan Note (Signed)
Pain is worse ?R AC inj ?

## 2022-01-18 DIAGNOSIS — M25552 Pain in left hip: Secondary | ICD-10-CM | POA: Diagnosis not present

## 2022-01-22 DIAGNOSIS — M6281 Muscle weakness (generalized): Secondary | ICD-10-CM | POA: Diagnosis not present

## 2022-01-22 DIAGNOSIS — M7062 Trochanteric bursitis, left hip: Secondary | ICD-10-CM | POA: Diagnosis not present

## 2022-01-24 DIAGNOSIS — M7062 Trochanteric bursitis, left hip: Secondary | ICD-10-CM | POA: Diagnosis not present

## 2022-01-24 DIAGNOSIS — M6281 Muscle weakness (generalized): Secondary | ICD-10-CM | POA: Diagnosis not present

## 2022-01-30 DIAGNOSIS — M7062 Trochanteric bursitis, left hip: Secondary | ICD-10-CM | POA: Diagnosis not present

## 2022-01-30 DIAGNOSIS — M6281 Muscle weakness (generalized): Secondary | ICD-10-CM | POA: Diagnosis not present

## 2022-02-15 DIAGNOSIS — E1151 Type 2 diabetes mellitus with diabetic peripheral angiopathy without gangrene: Secondary | ICD-10-CM | POA: Diagnosis not present

## 2022-02-15 DIAGNOSIS — I70203 Unspecified atherosclerosis of native arteries of extremities, bilateral legs: Secondary | ICD-10-CM | POA: Diagnosis not present

## 2022-02-15 DIAGNOSIS — I739 Peripheral vascular disease, unspecified: Secondary | ICD-10-CM | POA: Diagnosis not present

## 2022-02-15 DIAGNOSIS — B351 Tinea unguium: Secondary | ICD-10-CM | POA: Diagnosis not present

## 2022-03-15 IMAGING — DX DG LUMBAR SPINE 2-3V
3 series · 3 of 3 positions shown · non-contrast
Comparison: None.

CLINICAL DATA: Low back pain, bilateral hip pain

EXAM:
LUMBAR SPINE - 2-3 VIEW

[l-spine ap]
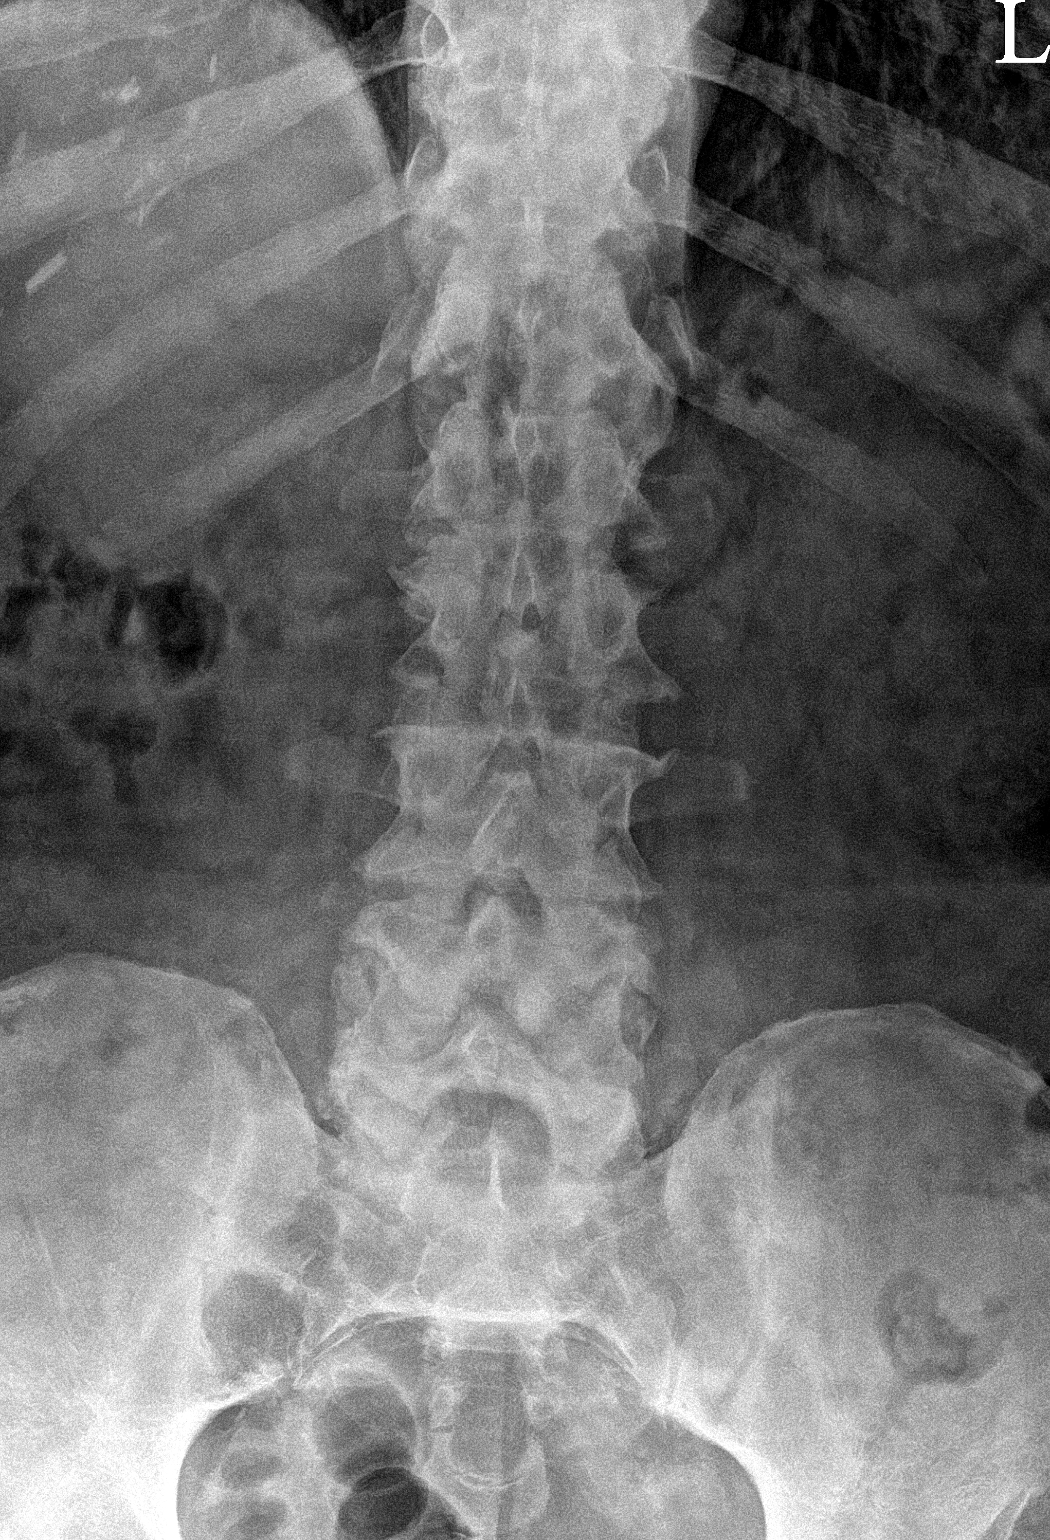

[l-spine lateral]
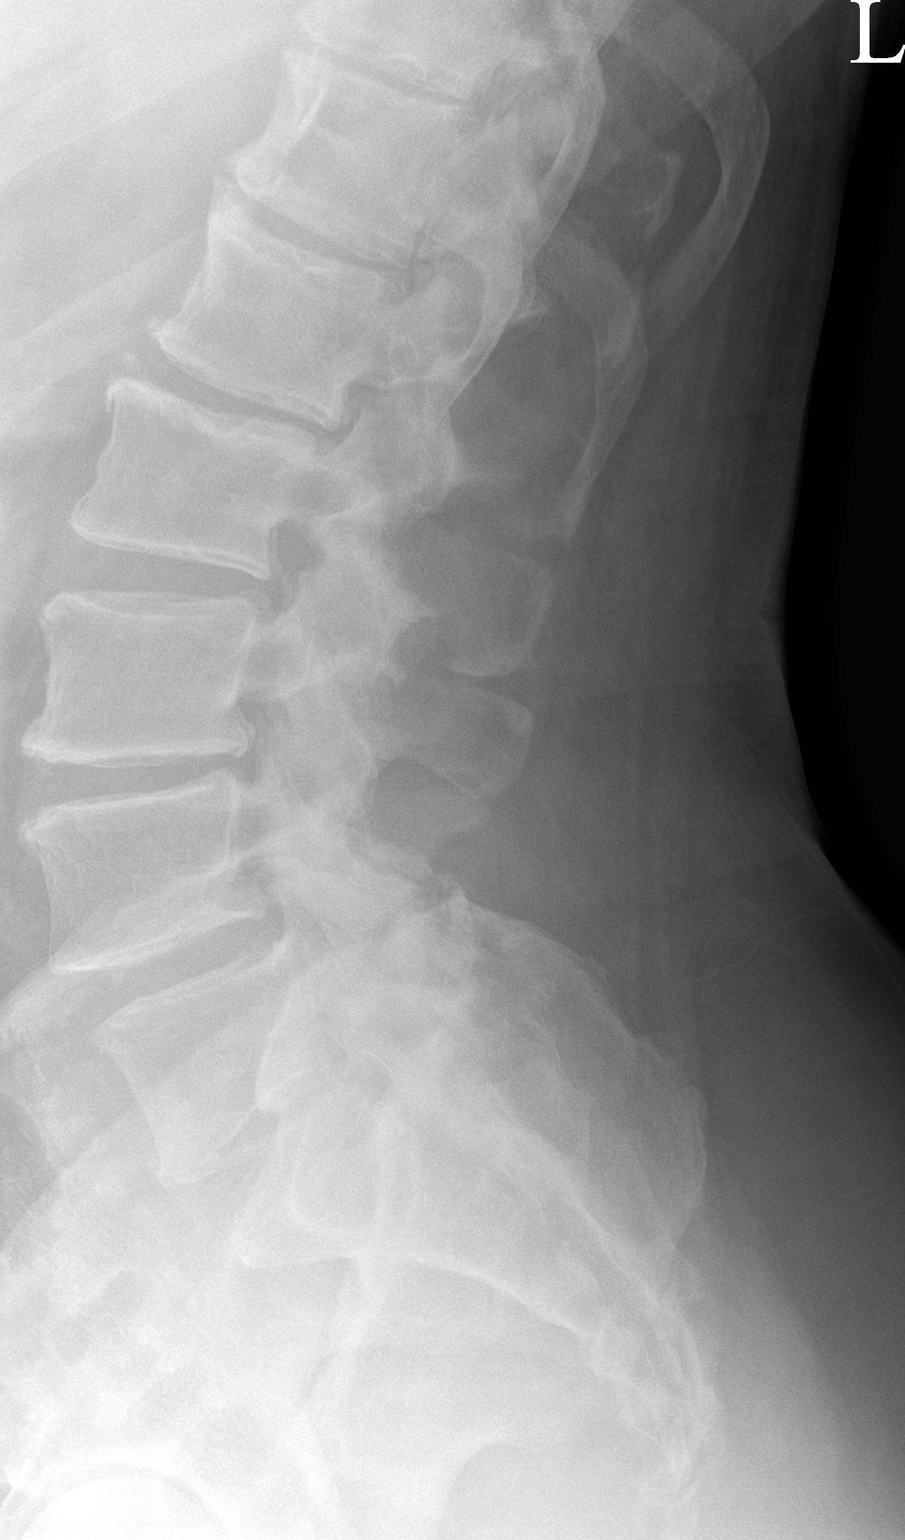

[l-spine spot]
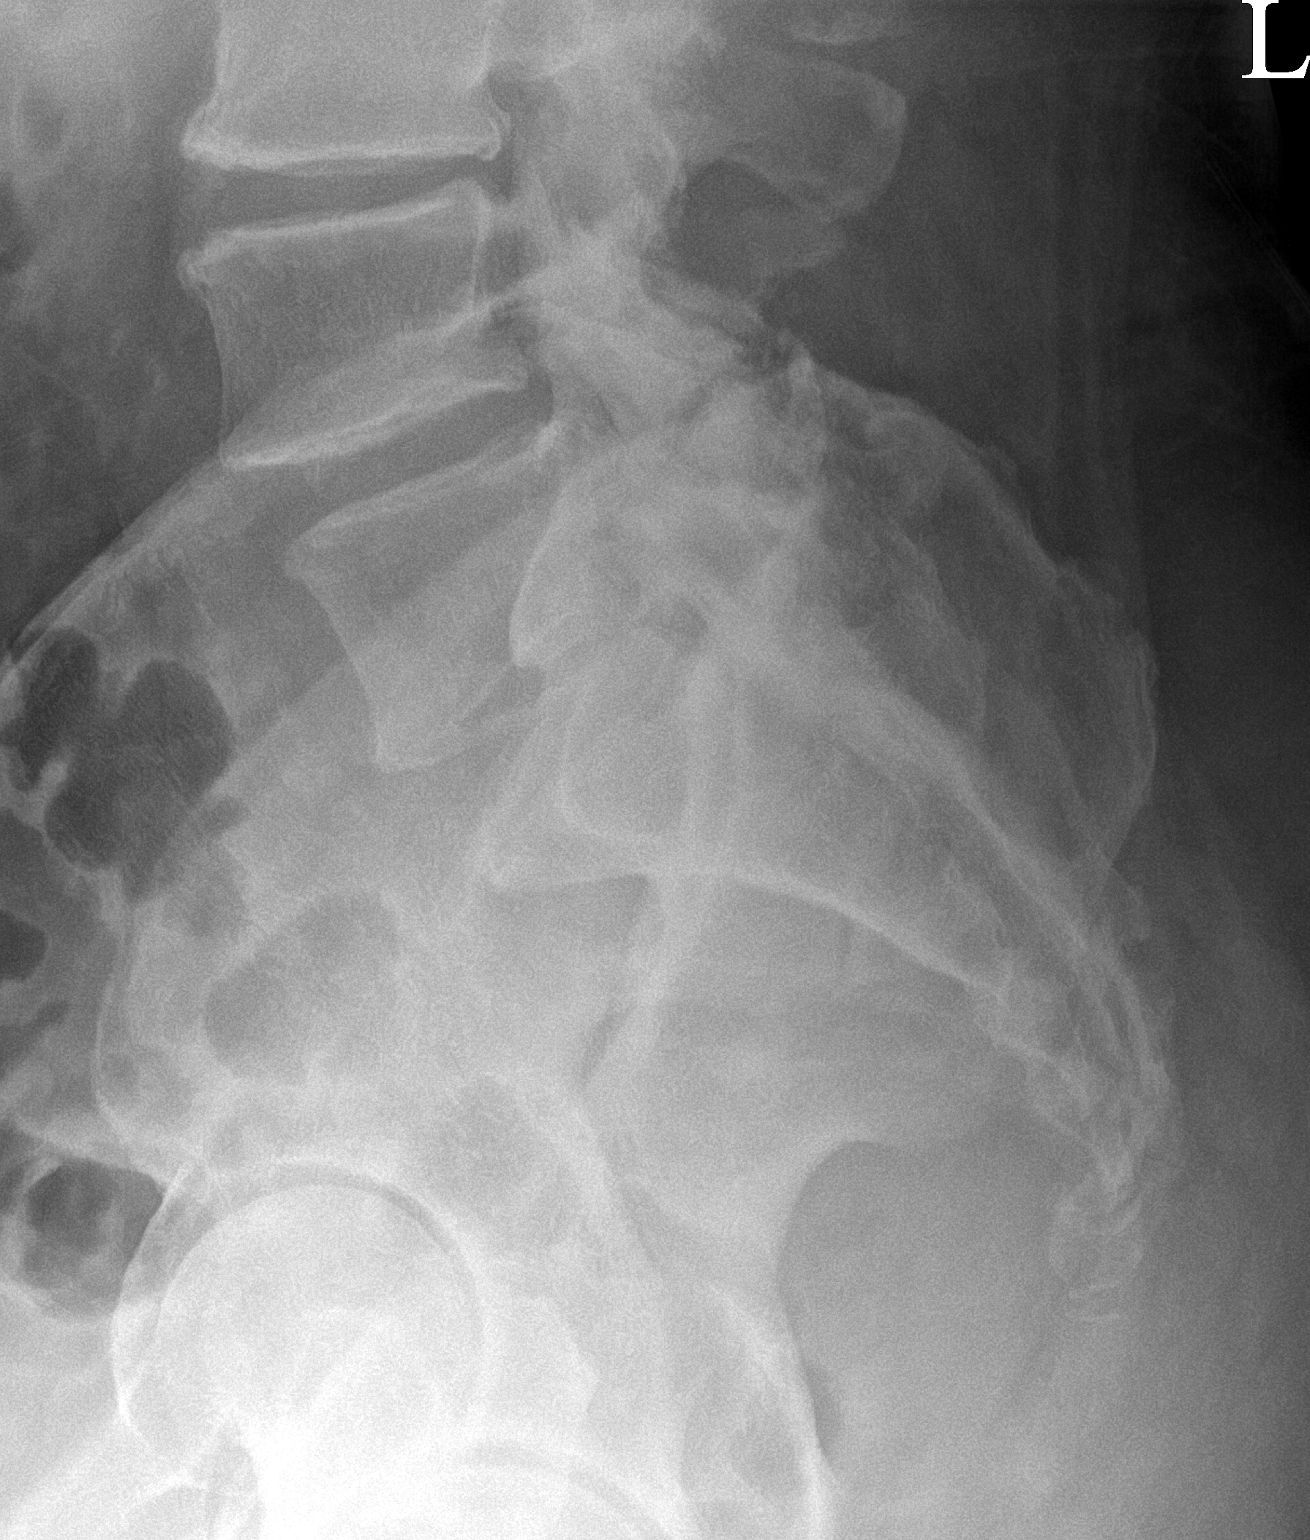

[3 of 3 positions shown; findings below may reference images not displayed]

FINDINGS: No recent fracture is seen. Degenerative changes are noted with disc
space narrowing, bony spurs and facet hypertrophy at multiple
levels. Alignment of posterior margins of vertebral bodies is
unremarkable. Surgical clips are seen in the right upper quadrant of
abdomen.
IMPRESSION: No recent fracture is seen in the lumbar spine. Degenerative changes
are noted with bony spurs, disc space narrowing and facet
hypertrophy at multiple levels in the lumbar spine.

## 2022-03-22 ENCOUNTER — Encounter: Payer: Self-pay | Admitting: Internal Medicine

## 2022-05-03 DIAGNOSIS — I70203 Unspecified atherosclerosis of native arteries of extremities, bilateral legs: Secondary | ICD-10-CM | POA: Diagnosis not present

## 2022-05-03 DIAGNOSIS — L851 Acquired keratosis [keratoderma] palmaris et plantaris: Secondary | ICD-10-CM | POA: Diagnosis not present

## 2022-05-03 DIAGNOSIS — E1151 Type 2 diabetes mellitus with diabetic peripheral angiopathy without gangrene: Secondary | ICD-10-CM | POA: Diagnosis not present

## 2022-05-03 DIAGNOSIS — L603 Nail dystrophy: Secondary | ICD-10-CM | POA: Diagnosis not present

## 2022-05-03 DIAGNOSIS — I739 Peripheral vascular disease, unspecified: Secondary | ICD-10-CM | POA: Diagnosis not present

## 2022-05-03 DIAGNOSIS — B351 Tinea unguium: Secondary | ICD-10-CM | POA: Diagnosis not present

## 2022-05-09 ENCOUNTER — Ambulatory Visit (AMBULATORY_SURGERY_CENTER): Payer: Self-pay | Admitting: *Deleted

## 2022-05-09 VITALS — Ht 74.0 in | Wt 273.0 lb

## 2022-05-09 DIAGNOSIS — Z85038 Personal history of other malignant neoplasm of large intestine: Secondary | ICD-10-CM

## 2022-05-09 DIAGNOSIS — Z86010 Personal history of colonic polyps: Secondary | ICD-10-CM

## 2022-05-09 MED ORDER — NA SULFATE-K SULFATE-MG SULF 17.5-3.13-1.6 GM/177ML PO SOLN: 1.0000 | ORAL | 0 refills | Status: DC

## 2022-05-09 NOTE — Progress Notes (Signed)
Patient is here in-person for PV. Patient denies any allergies to eggs or soy. Patient denies any problems with anesthesia/sedation. Patient is not on any oxygen at home. Patient is not taking any diet/weight loss medications or blood thinners. Went over procedure prep instructions with the patient. Patient is aware of our care-partner policy. Patient notified to use Good-Rx coupon given for prescription.

## 2022-06-01 DIAGNOSIS — H5201 Hypermetropia, right eye: Secondary | ICD-10-CM | POA: Diagnosis not present

## 2022-06-01 DIAGNOSIS — Z961 Presence of intraocular lens: Secondary | ICD-10-CM | POA: Diagnosis not present

## 2022-06-01 DIAGNOSIS — E119 Type 2 diabetes mellitus without complications: Secondary | ICD-10-CM | POA: Diagnosis not present

## 2022-06-01 LAB — HM DIABETES EYE EXAM

## 2022-06-04 ENCOUNTER — Encounter: Payer: Self-pay | Admitting: Internal Medicine

## 2022-06-04 ENCOUNTER — Ambulatory Visit (AMBULATORY_SURGERY_CENTER): Payer: Medicare PPO | Admitting: Internal Medicine

## 2022-06-04 VITALS — BP 140/75 | HR 66 | Temp 96.0°F | Resp 18 | Ht 74.0 in | Wt 273.0 lb

## 2022-06-04 DIAGNOSIS — K635 Polyp of colon: Secondary | ICD-10-CM | POA: Diagnosis not present

## 2022-06-04 DIAGNOSIS — Z08 Encounter for follow-up examination after completed treatment for malignant neoplasm: Secondary | ICD-10-CM

## 2022-06-04 DIAGNOSIS — J449 Chronic obstructive pulmonary disease, unspecified: Secondary | ICD-10-CM | POA: Diagnosis not present

## 2022-06-04 DIAGNOSIS — Z85038 Personal history of other malignant neoplasm of large intestine: Secondary | ICD-10-CM

## 2022-06-04 DIAGNOSIS — Z86010 Personal history of colonic polyps: Secondary | ICD-10-CM | POA: Diagnosis not present

## 2022-06-04 DIAGNOSIS — D124 Benign neoplasm of descending colon: Secondary | ICD-10-CM

## 2022-06-04 DIAGNOSIS — Z09 Encounter for follow-up examination after completed treatment for conditions other than malignant neoplasm: Secondary | ICD-10-CM | POA: Diagnosis not present

## 2022-06-04 DIAGNOSIS — G4733 Obstructive sleep apnea (adult) (pediatric): Secondary | ICD-10-CM | POA: Diagnosis not present

## 2022-06-04 MED ORDER — SODIUM CHLORIDE 0.9 % IV SOLN: 500.0000 mL | Freq: Once | INTRAVENOUS | Status: DC

## 2022-06-04 NOTE — Progress Notes (Signed)
Called to room to assist during endoscopic procedure.  Patient ID and intended procedure confirmed with present staff. Received instructions for my participation in the procedure from the performing physician.  

## 2022-06-04 NOTE — Patient Instructions (Signed)
Information on polyps given to you today.  Await pathology results.  Resume previous diet and medications.    YOU HAD AN ENDOSCOPIC PROCEDURE TODAY AT THE [facility]:   Refer to the procedure report that was given to you for any specific questions about what was found during the examination.  If the procedure report does not answer your questions, please call your gastroenterologist to clarify.  If you requested that your care partner not be given the details of your procedure findings, then the procedure report has been included in a sealed envelope for you to review at your convenience later.  YOU SHOULD EXPECT: Some feelings of bloating in the abdomen. Passage of more gas than usual.  Walking can help get rid of the air that was put into your GI tract during the procedure and reduce the bloating. If you had a lower endoscopy (such as a colonoscopy or flexible sigmoidoscopy) you may notice spotting of blood in your stool or on the toilet paper. If you underwent a bowel prep for your procedure, you may not have a normal bowel movement for a few days.  Please Note:  You might notice some irritation and congestion in your nose or some drainage.  This is from the oxygen used during your procedure.  There is no need for concern and it should clear up in a day or so.  SYMPTOMS TO REPORT IMMEDIATELY:  Following lower endoscopy (colonoscopy or flexible sigmoidoscopy):  Excessive amounts of blood in the stool  Significant tenderness or worsening of abdominal pains  Swelling of the abdomen that is new, acute  Fever of 100F or higher  For urgent or emergent issues, a gastroenterologist can be reached at any hour by calling [phone]. Do not use MyChart messaging for urgent concerns.    DIET:  We do recommend a small meal at first, but then you may proceed to your regular diet.  Drink plenty of fluids but you should avoid alcoholic beverages for 24 hours.  ACTIVITY:  You should  plan to take it easy for the rest of today and you should NOT DRIVE or use heavy machinery until tomorrow (because of the sedation medicines used during the test).    FOLLOW UP: Our staff will call the number listed on your records the next business day following your procedure.  We will call around 7:15- 8:00 am to check on you and address any questions or concerns that you may have regarding the information given to you following your procedure. If we do not reach you, we will leave a message.     If any biopsies were taken you will be contacted by phone or by letter within the next 1-3 weeks.  Please call us at [phone] if you have not heard about the biopsies in 3 weeks.    SIGNATURES/CONFIDENTIALITY: You and/or your care partner have signed paperwork which will be entered into your electronic medical record.  These signatures attest to the fact that that the information above on your After Visit Summary has been reviewed and is understood.  Full responsibility of the confidentiality of this discharge information lies with you and/or your care-partner.  

## 2022-06-04 NOTE — Progress Notes (Signed)
HISTORY OF PRESENT ILLNESS:  [name] [name] is a 71 y.o. male with a history of multiple adenomatous colon polyps, advanced lesions, and malignant polyp.  Now for surveillance  REVIEW OF SYSTEMS:  All non-GI ROS negative.  Past Medical History:  Diagnosis Date   Allergy    Bilateral renal cysts    benign/ Patient denies   COPD (chronic obstructive pulmonary disease) ([place])     PFT 2010 due to previous 20y h/o smoking   DDD (degenerative disc disease), lumbar    Diabetes mellitus type II    ED (erectile dysfunction) of organic origin    First degree heart block    History of colon polyps    History of prostate cancer current PSA per pt 0.13   dx March 2014---  s/p radiactive prostate seed implants and intraop radiotherapy in [place], [place]   Hyperlipidemia    Hypertension    Liver mass 06/2013   OVER HALF OF LIVER REMOVED/ BENIGN   OSA on CPAP    Osteoarthritis    PAF (paroxysmal atrial fibrillation) [facility])    cardiologist--  dr Percival Spanish  (episode prior to liver surgery at [facility] 2014 w/ post-op RVR)   Prostate cancer ([place]) 11-11-2012   s/p chemo, radiation    Rectal cancer ([place])    01-25-2015----per pathology --  complete rectal polypectomy-- showed  invasive carcinoma rising in tubular adnemoa high grade hyperplasia--  per dr Ardis Hughs per MRI  no other area seen -- will have colonoscopy done in 3 months   Sleep apnea    wears cpap   Ulcer    STOMACH ULCER   Wears glasses     Past Surgical History:  Procedure Laterality Date    RIGHT LOBE HEPATECTOMY  06-12-2013    [facility]   benign mass   CARDIOVASCULAR STRESS TEST  10-01-2014   dr hochrein   normal perfusion study, no ischemia,  normal LV function and wall motion, ef 70%   COLONOSCOPY  2019   ESOPHAGOGASTRODUODENOSCOPY N/A 06/23/2016   Procedure: ESOPHAGOGASTRODUODENOSCOPY (EGD);  Surgeon: Manus Gunning, MD;  Location: [facility];  Service: Gastroenterology;  Laterality: N/A;   EUS N/A 02/10/2015    Procedure: LOWER ENDOSCOPIC ULTRASOUND (EUS);  Surgeon: Milus Banister, MD;  Location: [facility];  Service: Endoscopy;  Laterality: N/A;   KNEE ARTHROSCOPY Bilateral 1990's   LUMBAR FUSION  2001   PENILE PROSTHESIS IMPLANT N/A 04/25/2015   Procedure: PENILE PROSTHESIS THREE PIECE INFLATABLE( COLOPLAST) SCROTAL APPROACH;  Surgeon: Kathie Rhodes, MD;  Location: [facility];  Service: Urology;  Laterality: N/A;   POLYPECTOMY     RADIOACTIVE PROSTATE SEED IMPLANTS  May 2014  in [place], [place] Right 02/21/2016   TOTAL KNEE ARTHROPLASTY Left 06/2012   TOTAL KNEE ARTHROPLASTY Right 2017   TRANSTHORACIC ECHOCARDIOGRAM  12/26/2012   mild focal basal septal hypertrophy,  grade II diastolic dysfunction,  ef 55-60%,  mild LAE,  trivial MR and TR   UPPER GASTROINTESTINAL ENDOSCOPY     VASECTOMY  1980's w/ gen. anes.    Social History [name] [name]  reports that he quit smoking about 22 years ago. His smoking use included cigarettes. He has a 30.00 pack-year smoking history. He has never used smokeless tobacco. He reports current alcohol use. He reports that he does not use drugs.  family history includes Diabetes in his mother; Heart disease in his mother; Hypertension in an other family member; Pancreatic cancer in his  father and sister; Stomach cancer in his father and sister.  Allergies  Allergen Reactions   Benazepril Hcl Cough   Sildenafil Other (See Comments)    Caused heart to race and elevated B/P       PHYSICAL EXAMINATION: Vital signs: BP (!) 141/72   Pulse 69   Temp (!) 96 F (35.6 C) (Temporal)   Resp 18   Ht '6\' 2"'$  (1.88 m)   Wt 273 lb (123.8 kg)   SpO2 98%   BMI 35.05 kg/m  General: Well-developed, well-nourished, no acute distress HEENT: Sclerae are anicteric, conjunctiva pink. Oral mucosa intact Lungs: Clear Heart: Regular Abdomen: soft, nontender, nondistended, no obvious ascites, no peritoneal signs, normal bowel sounds. No  organomegaly. Extremities: No edema Psychiatric: alert and oriented x3. Cooperative      ASSESSMENT:  Personal history of multiple adenomatous colon polyps, malignant colon polyp, and advanced lesions.  Now for surveillance   PLAN:   Surveillance colonoscopy

## 2022-06-04 NOTE — Progress Notes (Signed)
VS completed by DT.  Pt's states no medical or surgical changes since previsit or office visit.  

## 2022-06-04 NOTE — Progress Notes (Signed)
Report to PACU, RN, vss, BBS= Clear.  

## 2022-06-04 NOTE — Op Note (Signed)
[facility] Patient Name: [name] [name] Procedure Date: 06/04/2022 9:13 AM MRN: [id number] Endoscopist: Docia Chuck. [name] , MD Age: 71 Referring MD:  Date of Birth: May 03, 1951 Gender: Male Account #: [id number] Procedure:                Colonoscopy with cold snare polypectomy x 1 Indications:              High risk colon cancer surveillance: Personal                            history of adenoma (10 mm or greater in size), High                            risk colon cancer surveillance: Personal history of                            adenoma with high grade dysplasia, High risk colon                            cancer surveillance: Personal history of multiple                            (3 or more) adenomas, High risk colon cancer                            surveillance: Personal history of colon cancer.                            2016 with malignant polyp at the anal verge.                            Follow-up in 2017 with residual adenoma. Follow-up                            2017. Follow-up 2019 with residual adenoma with                            high-grade dysplasia. Felt to be entirely resected.                            Follow-up in 2020 with flexible sigmoidoscopy                            showed no evidence of recurrence. Medicines:                Monitored Anesthesia Care Procedure:                Pre-Anesthesia Assessment:                           - Prior to the procedure, a History and Physical                            was performed, and patient medications and  allergies were reviewed. The patient's tolerance of                            previous anesthesia was also reviewed. The risks                            and benefits of the procedure and the sedation                            options and risks were discussed with the patient.                            All questions were answered, and informed consent                             was obtained. Prior Anticoagulants: The patient has                            taken no previous anticoagulant or antiplatelet                            agents. ASA Grade Assessment: II - A patient with                            mild systemic disease. After reviewing the risks                            and benefits, the patient was deemed in                            satisfactory condition to undergo the procedure.                           After obtaining informed consent, the colonoscope                            was passed under direct vision. Throughout the                            procedure, the patient's blood pressure, pulse, and                            oxygen saturations were monitored continuously. The                            CF [id number] #[id number] was introduced through the anus                            and advanced to the the cecum, identified by                            appendiceal orifice and ileocecal valve. The  ileocecal valve, appendiceal orifice, and rectum                            were photographed. The quality of the bowel                            preparation was excellent. The colonoscopy was                            performed without difficulty. The patient tolerated                            the procedure well. The bowel preparation used was                            SUPREP via split dose instruction. Scope In: 9:28:53 AM Scope Out: 9:46:21 AM Scope Withdrawal Time: 0 hours 8 minutes 50 seconds  Total Procedure Duration: 0 hours 17 minutes 28 seconds  Findings:                 A 3 mm polyp was found in the descending colon. The                            polyp was removed with a cold snare. Resection and                            retrieval were complete.                           Internal hemorrhoids were found during                            retroflexion. The colon was redundant.                           The exam  was otherwise without abnormality on                            direct and retroflexion views. Complications:            No immediate complications. Estimated blood loss:                            None. Estimated Blood Loss:     Estimated blood loss: none. Impression:               - One 3 mm polyp in the descending colon, removed                            with a cold snare. Resected and retrieved.                           - Internal hemorrhoids.                           - REDUNDANT COLON                           -  The examination was otherwise normal on direct                            and retroflexion views. Recommendation:           - Repeat colonoscopy in 5 years for surveillance.                           - Patient has a contact number available for                            emergencies. The signs and symptoms of potential                            delayed complications were discussed with the                            patient. Return to normal activities tomorrow.                            Written discharge instructions were provided to the                            patient.                           - Resume previous diet.                           - Continue present medications.                           - Await pathology results. Docia Chuck. [name], MD 06/04/2022 9:53:13 AM This report has been signed electronically.

## 2022-06-05 ENCOUNTER — Telehealth: Payer: Self-pay

## 2022-06-05 NOTE — Telephone Encounter (Signed)
  Follow up Call-     06/04/2022    8:18 AM  Call back number  Post procedure Call Back phone  # [phone]  Permission to leave phone message Yes     Post op call attempted, no answer, left WM.

## 2022-06-07 ENCOUNTER — Encounter: Payer: Self-pay | Admitting: *Deleted

## 2022-06-07 ENCOUNTER — Encounter: Payer: Self-pay | Admitting: Internal Medicine

## 2022-06-09 ENCOUNTER — Other Ambulatory Visit: Payer: Self-pay | Admitting: Internal Medicine

## 2022-06-15 ENCOUNTER — Other Ambulatory Visit (HOSPITAL_BASED_OUTPATIENT_CLINIC_OR_DEPARTMENT_OTHER): Payer: Self-pay

## 2022-06-15 MED ORDER — PIOGLITAZONE HCL-METFORMIN HCL 15-850 MG PO TABS
ORAL_TABLET | ORAL | 3 refills | Status: DC
  Filled 2022-06-15 – 2022-07-13 (×2): qty 180, 90d supply, fill #0

## 2022-06-15 MED ORDER — POTASSIUM CHLORIDE CRYS ER 20 MEQ PO TBCR
20.0000 meq | EXTENDED_RELEASE_TABLET | Freq: Every day | ORAL | 2 refills | Status: DC
  Filled 2022-06-15 – 2022-07-13 (×2): qty 90, 90d supply, fill #0
  Filled 2022-11-30: qty 90, 90d supply, fill #1

## 2022-06-15 MED ORDER — AMLODIPINE-OLMESARTAN 10-40 MG PO TABS
1.0000 | ORAL_TABLET | Freq: Every day | ORAL | 1 refills | Status: DC
  Filled 2022-06-15 – 2022-09-19 (×4): qty 90, 90d supply, fill #0

## 2022-06-15 MED ORDER — COVID-19 MRNA 2023-2024 VACCINE (COMIRNATY) 0.3 ML INJECTION
INTRAMUSCULAR | 0 refills | Status: AC
  Filled 2022-06-15: qty 0.3, 1d supply, fill #0

## 2022-06-15 MED ORDER — TAMSULOSIN HCL 0.4 MG PO CAPS
0.4000 mg | ORAL_CAPSULE | Freq: Every morning | ORAL | 3 refills | Status: DC
  Filled 2022-06-15: qty 90, 90d supply, fill #0
  Filled 2022-09-19: qty 90, 90d supply, fill #1

## 2022-06-16 ENCOUNTER — Other Ambulatory Visit (HOSPITAL_BASED_OUTPATIENT_CLINIC_OR_DEPARTMENT_OTHER): Payer: Self-pay

## 2022-06-18 ENCOUNTER — Other Ambulatory Visit (HOSPITAL_BASED_OUTPATIENT_CLINIC_OR_DEPARTMENT_OTHER): Payer: Self-pay

## 2022-06-26 ENCOUNTER — Other Ambulatory Visit (HOSPITAL_BASED_OUTPATIENT_CLINIC_OR_DEPARTMENT_OTHER): Payer: Self-pay

## 2022-06-26 MED ORDER — INFLUENZA VAC A&B SA ADJ QUAD 0.5 ML IM PRSY
PREFILLED_SYRINGE | INTRAMUSCULAR | 0 refills | Status: AC
  Filled 2022-06-26: qty 0.5, 1d supply, fill #0

## 2022-07-13 ENCOUNTER — Other Ambulatory Visit (HOSPITAL_BASED_OUTPATIENT_CLINIC_OR_DEPARTMENT_OTHER): Payer: Self-pay

## 2022-07-31 ENCOUNTER — Other Ambulatory Visit (HOSPITAL_BASED_OUTPATIENT_CLINIC_OR_DEPARTMENT_OTHER): Payer: Self-pay

## 2022-07-31 ENCOUNTER — Other Ambulatory Visit: Payer: Self-pay | Admitting: Internal Medicine

## 2022-07-31 MED ORDER — OZEMPIC (2 MG/DOSE) 8 MG/3ML ~~LOC~~ SOPN
2.0000 mg | PEN_INJECTOR | SUBCUTANEOUS | 5 refills | Status: DC
  Filled 2022-07-31: qty 3, 28d supply, fill #0

## 2022-07-31 MED ORDER — INDOMETHACIN 50 MG PO CAPS
50.0000 mg | ORAL_CAPSULE | Freq: Three times a day (TID) | ORAL | 3 refills | Status: AC | PRN
  Filled 2022-07-31: qty 60, 20d supply, fill #0
  Filled 2022-09-19: qty 60, 20d supply, fill #1
  Filled 2022-11-30: qty 60, 20d supply, fill #2
  Filled 2023-01-02: qty 60, 20d supply, fill #3

## 2022-08-02 ENCOUNTER — Other Ambulatory Visit (HOSPITAL_BASED_OUTPATIENT_CLINIC_OR_DEPARTMENT_OTHER): Payer: Self-pay

## 2022-08-06 ENCOUNTER — Telehealth: Payer: Self-pay | Admitting: Internal Medicine

## 2022-08-06 DIAGNOSIS — B351 Tinea unguium: Secondary | ICD-10-CM | POA: Diagnosis not present

## 2022-08-06 DIAGNOSIS — I70203 Unspecified atherosclerosis of native arteries of extremities, bilateral legs: Secondary | ICD-10-CM | POA: Diagnosis not present

## 2022-08-06 DIAGNOSIS — I739 Peripheral vascular disease, unspecified: Secondary | ICD-10-CM | POA: Diagnosis not present

## 2022-08-06 DIAGNOSIS — E1151 Type 2 diabetes mellitus with diabetic peripheral angiopathy without gangrene: Secondary | ICD-10-CM | POA: Diagnosis not present

## 2022-08-06 DIAGNOSIS — E1142 Type 2 diabetes mellitus with diabetic polyneuropathy: Secondary | ICD-10-CM | POA: Diagnosis not present

## 2022-08-06 DIAGNOSIS — L603 Nail dystrophy: Secondary | ICD-10-CM | POA: Diagnosis not present

## 2022-08-06 NOTE — Telephone Encounter (Signed)
Called pt back inform pt he suppose to be taking Actos/Metformin and Ozempic. He states he just wanted to make sure just got then ozempic filled...[name] [name]

## 2022-08-06 NOTE — Telephone Encounter (Signed)
Patient wants to know if he should be on Ozempic or the metformin - please advise.

## 2022-08-10 ENCOUNTER — Other Ambulatory Visit (HOSPITAL_BASED_OUTPATIENT_CLINIC_OR_DEPARTMENT_OTHER): Payer: Self-pay

## 2022-08-10 MED ORDER — AREXVY 120 MCG/0.5ML IM SUSR
INTRAMUSCULAR | 0 refills | Status: AC
  Filled 2022-08-10: qty 0.5, 1d supply, fill #0

## 2022-08-21 ENCOUNTER — Telehealth: Payer: Medicare PPO | Admitting: Physician Assistant

## 2022-08-21 DIAGNOSIS — R6883 Chills (without fever): Secondary | ICD-10-CM | POA: Diagnosis not present

## 2022-08-21 DIAGNOSIS — R112 Nausea with vomiting, unspecified: Secondary | ICD-10-CM

## 2022-08-21 NOTE — Progress Notes (Signed)
Virtual Visit Consent   [name] [name], you are scheduled for a virtual visit with a [facility] provider today. Just as with appointments in the office, your consent must be obtained to participate. Your consent will be active for this visit and any virtual visit you may have with one of our providers in the next 365 days. If you have a MyChart account, a copy of this consent can be sent to you electronically.  As this is a virtual visit, video technology does not allow for your provider to perform a traditional examination. This may limit your provider's ability to fully assess your condition. If your provider identifies any concerns that need to be evaluated in person or the need to arrange testing (such as labs, EKG, etc.), we will make arrangements to do so. Although advances in technology are sophisticated, we cannot ensure that it will always work on either your end or our end. If the connection with a video visit is poor, the visit may have to be switched to a telephone visit. With either a video or telephone visit, we are not always able to ensure that we have a secure connection.  By engaging in this virtual visit, you consent to the provision of healthcare and authorize for your insurance to be billed (if applicable) for the services provided during this visit. Depending on your insurance coverage, you may receive a charge related to this service.  I need to obtain your verbal consent now. Are you willing to proceed with your visit today? Rector S Kelemen has provided verbal consent on 08/21/2022 for a virtual visit (video or telephone).  Cody , PA-C  Date: 08/21/2022 5:16 PM  Virtual Visit via Video Note   I,  Cody , connected with  [name] [name]  ([id number], 12/29/1950) on 08/21/22 at  5:00 PM EST by a video-enabled telemedicine application and verified that I am speaking with the correct person using two identifiers.  Location: Patient: Virtual Visit  Location Patient: Home Provider: Virtual Visit Location Provider: Home Office   I discussed the limitations of evaluation and management by telemedicine and the availability of in person appointments. The patient expressed understanding and agreed to proceed.    History of Present Illness: [name] [name] is a 71 y.o. who identifies as a male who was assigned male at birth, and is being seen today for possible flu. Wife and patient note starting last night with nausea/vomiting throughout night associated with some urinary urgency/frequency. Wife checked glucose at time of emesis -- 99. Today with continued N/V, aching and chills. Wife notes some sweats. Patient denies dysuria, hematuria or incomplete emptying but notes increased urinary urgency/frequency. Denies URI symptoms. Denies diarrhea. Has been laying in bed all day.  Wife notes he did start Ozempic for the first time yesterday.  HPI: HPI  Problems:  Patient Active Problem List   Diagnosis Date Noted   Intermittent diarrhea 09/09/2020   TIA (transient ischemic attack) 12/08/2019   AC joint pain 09/22/2019   Biceps tendinitis 09/25/2018   Melena 06/22/2016   Fatigue 08/10/2015   Erectile dysfunction 04/25/2015   Anemia due to chronic blood loss 12/21/2014   GI bleed 12/21/2014   Chest pain at rest 09/30/2014   Edema 07/13/2014   Allergic rhinitis 03/17/2014   PAF (paroxysmal atrial fibrillation) (HCC) 05/25/2013   Cardiomegaly 12/12/2012   Liver mass 12/12/2012   Prostate cancer (HCC) 12/11/2012   Hepatic cyst 12/11/2012   Hematuria, gross 09/05/2012   Knee osteoarthritis   09/05/2012   OSA on CPAP 09/05/2012   Snoring 09/07/2011   Paresthesia of left arm 07/06/2011   Gout attack 03/13/2011   Well adult exam 03/03/2011   Arthritis of knee 03/03/2011   MENTAL CONFUSION 03/09/2010   GAIT DISTURBANCE 03/09/2010   HEAT STROKE 03/09/2010   FOOT PAIN 11/08/2009   CHEST PAIN UNSPECIFIED 08/19/2009   COPD (chronic obstructive  pulmonary disease) (HCC) 07/08/2009   TOBACCO USE, QUIT 07/08/2009   RECTAL BLEEDING 12/07/2008   PERSONAL HISTORY OF COLONIC POLYPS 12/07/2008   Dyslipidemia 03/02/2008   HEMATOCHEZIA 03/02/2008   COUGH 03/02/2008   HYPOKALEMIA 02/03/2008   Acute sinusitis 02/03/2008   SYNCOPE 02/03/2008   DM (diabetes mellitus), type 2 with complications (HCC) 11/27/2007   Shoulder pain 11/27/2007   ERECTILE DYSFUNCTION 10/30/2007   Essential hypertension 10/30/2007   OSTEOARTHRITIS 10/30/2007   Bilateral low back pain with bilateral sciatica 10/30/2007    Allergies:  Allergies  Allergen Reactions   Benazepril Hcl Cough   Sildenafil Other (See Comments)    Caused heart to race and elevated B/P   Medications:  Current Outpatient Medications:    acetaminophen-codeine (TYLENOL #3) 300-30 MG tablet, Take by mouth every 4 (four) hours as needed for moderate pain., Disp: , Rfl:    amLODipine-benazepril (LOTREL) 10-40 MG capsule, Take 1 capsule by mouth once daily, Disp: 90 capsule, Rfl: 1   ASPIRIN LOW DOSE 81 MG chewable tablet, Chew by mouth., Disp: , Rfl:    blood glucose meter kit and supplies KIT, Dispense based on patient and insurance preference. Use up to four times daily as directed. (FOR ICD-9 250.00, 250.01)., Disp: 1 each, Rfl: 0   COVID-19 mRNA vaccine 2023-2024 (COMIRNATY) SUSP injection, Inject into the muscle., Disp: 0.3 mL, Rfl: 0   diclofenac Sodium (VOLTAREN) 1 % GEL, APPLY TOPICALLY TO THE AFFECTED AREA FOUR TIMES DAILY, Disp: 100 g, Rfl: 0   EPINEPHrine 0.3 mg/0.3 mL IJ SOAJ injection, SMARTSIG:1 pre-filled pen syringe IM Once, Disp: , Rfl:    fluticasone (FLONASE) 50 MCG/ACT nasal spray, USE ONE SPRAY IN EACH NOSTRIL DAILY, Disp: 48 g, Rfl: 3   furosemide (LASIX) 20 MG tablet, Take 1-2 tablets (20-40 mg total) by mouth daily as needed for edema., Disp: 180 tablet, Rfl: 3   hydrochlorothiazide (HYDRODIURIL) 25 MG tablet, Take 1 tablet (25 mg total) by mouth daily., Disp: 90 tablet,  Rfl: 3   indomethacin (INDOCIN) 50 MG capsule, Take 1 capsule (50 mg total) by mouth 3 (three) times daily as needed for moderate pain or gout, Disp: 60 capsule, Rfl: 3   influenza vaccine adjuvanted (FLUAD) 0.5 ML injection, Inject into the muscle., Disp: 0.5 mL, Rfl: 0   metoprolol tartrate (LOPRESSOR) 50 MG tablet, Take 1 tablet (50 mg total) by mouth daily. Follow-up appt due in Oct must see provider for future refills, Disp: 90 tablet, Rfl: 3   Multiple Vitamin (MULTIVITAMIN) tablet, Take 1 tablet by mouth daily., Disp: , Rfl:    Omega-3 Fatty Acids (FISH OIL PO), Take 2 tablets by mouth daily., Disp: , Rfl:    pantoprazole (PROTONIX) 40 MG tablet, Take 1 tablet (40 mg total) by mouth 2 (two) times daily., Disp: 180 tablet, Rfl: 3   pioglitazone-metformin (ACTOPLUS MET) 15-850 MG tablet, TAKE 1 TABLET BY MOUTH TWICE DAILY( EVERY TWELVE HOURS), Disp: 180 tablet, Rfl: 3   pioglitazone-metformin (ACTOPLUS MET) 15-850 MG tablet, Take 1 tablet by mouth every 12 hours, Disp: 180 tablet, Rfl: 3   potassium chloride SA (  KLOR-CON M) 20 MEQ tablet, TAKE 1 TABLET(20 MEQ) BY MOUTH DAILY, Disp: 90 tablet, Rfl: 3   potassium chloride SA (KLOR-CON M) 20 MEQ tablet, Take 1 tablet (20 mEq total) by mouth daily., Disp: 90 tablet, Rfl: 2   pravastatin (PRAVACHOL) 40 MG tablet, Take 1 tablet (40 mg total) by mouth daily., Disp: 90 tablet, Rfl: 3   RSV vaccine recomb adjuvanted (AREXVY) 120 MCG/0.5ML injection, Inject into the muscle., Disp: 0.5 mL, Rfl: 0   Semaglutide, 2 MG/DOSE, (OZEMPIC, 2 MG/DOSE,) 8 MG/3ML SOPN, Inject 2 mg into the skin once a week., Disp: 9 mL, Rfl: 5   tamsulosin (FLOMAX) 0.4 MG CAPS capsule, Take 1 capsule (0.4 mg total) by mouth every morning., Disp: 90 capsule, Rfl: 3   tamsulosin (FLOMAX) 0.4 MG CAPS capsule, Take 1 capsule (0.4 mg total) by mouth every morning., Disp: 90 capsule, Rfl: 3  Observations/Objective: Patient is well-developed, well-nourished in no acute distress.  Resting  comfortably at home.  Head is normocephalic, atraumatic.  No labored breathing. Speech is clear and coherent with logical content.  Patient is alert and oriented at baseline.   Assessment and Plan: 1. Nausea and vomiting, unspecified vomiting type  2. Chills  Unclear etiology.  Discussed that with chills and aches, certainly you cannot rule out a flulike illness or potentially COVID, however symptoms started with urinary urgency as/frequency and nausea and vomiting which could indicate a complicated urinary infection in an elderly, male patient.  Also discussed potential of Ozempic and causing the GI distress since this was just started, and giving his inability to keep anything and and his significant medical history, he requires an in person evaluation this evening.  Send instructions that they could be evaluated at nearest urgent care as they are hesitant to go to the emergency room.  If urgent care provider not available or anything worsens, they are to seek an ER evaluation  Follow Up Instructions: I discussed the assessment and treatment plan with the patient. The patient was provided an opportunity to ask questions and all were answered. The patient agreed with the plan and demonstrated an understanding of the instructions.  A copy of instructions were sent to the patient via MyChart unless otherwise noted below.   The patient was advised to call back or seek an in-person evaluation if the symptoms worsen or if the condition fails to improve as anticipated.  Time:  I spent 10 minutes with the patient via telehealth technology discussing the above problems/concerns.     Cody , PA-C 

## 2022-08-21 NOTE — Patient Instructions (Signed)
[name], thank you for joining Leeanne Rio, PA-C for today's virtual visit.  While this provider is not your primary care provider (PCP), if your PCP is located in our provider database this encounter information will be shared with them immediately following your visit.   [facility] account gives you access to today's visit and all your visits, tests, and labs performed at [facility] " click here if you don't have a [facility] account or go to [email or web address]  Consent: (Patient) [name] [name] provided verbal consent for this virtual visit at the beginning of the encounter.  Current Medications:  Current Outpatient Medications:    acetaminophen-codeine (TYLENOL #3) 300-30 MG tablet, Take by mouth every 4 (four) hours as needed for moderate pain., Disp: , Rfl:    amLODipine-benazepril (LOTREL) 10-40 MG capsule, Take 1 capsule by mouth once daily, Disp: 90 capsule, Rfl: 1   amLODipine-olmesartan (AZOR) 10-40 MG tablet, Take 1 tablet by mouth daily., Disp: 90 tablet, Rfl: 3   ASPIRIN LOW DOSE 81 MG chewable tablet, Chew by mouth., Disp: , Rfl:    blood glucose meter kit and supplies KIT, Dispense based on patient and insurance preference. Use up to four times daily as directed. (FOR ICD-9 250.00, 250.01)., Disp: 1 each, Rfl: 0   COVID-19 mRNA vaccine 2023-2024 (COMIRNATY) SUSP injection, Inject into the muscle., Disp: 0.3 mL, Rfl: 0   diclofenac Sodium (VOLTAREN) 1 % GEL, APPLY TOPICALLY TO THE AFFECTED AREA FOUR TIMES DAILY, Disp: 100 g, Rfl: 0   EPINEPHrine 0.3 mg/0.3 mL IJ SOAJ injection, SMARTSIG:1 pre-filled pen syringe IM Once, Disp: , Rfl:    fluticasone (FLONASE) 50 MCG/ACT nasal spray, USE ONE SPRAY IN EACH NOSTRIL DAILY, Disp: 48 g, Rfl: 3   furosemide (LASIX) 20 MG tablet, Take 1-2 tablets (20-40 mg total) by mouth daily as needed for edema., Disp: 180 tablet, Rfl: 3   hydrochlorothiazide (HYDRODIURIL) 25 MG tablet, Take 1  tablet (25 mg total) by mouth daily., Disp: 90 tablet, Rfl: 3   indomethacin (INDOCIN) 50 MG capsule, Take 1 capsule (50 mg total) by mouth 3 (three) times daily as needed for moderate pain or gout, Disp: 60 capsule, Rfl: 3   influenza vaccine adjuvanted (FLUAD) 0.5 ML injection, Inject into the muscle., Disp: 0.5 mL, Rfl: 0   metoprolol tartrate (LOPRESSOR) 50 MG tablet, Take 1 tablet (50 mg total) by mouth daily. Follow-up appt due in Oct must see provider for future refills, Disp: 90 tablet, Rfl: 3   Multiple Vitamin (MULTIVITAMIN) tablet, Take 1 tablet by mouth daily., Disp: , Rfl:    Omega-3 Fatty Acids (FISH OIL PO), Take 2 tablets by mouth daily., Disp: , Rfl:    pantoprazole (PROTONIX) 40 MG tablet, Take 1 tablet (40 mg total) by mouth 2 (two) times daily., Disp: 180 tablet, Rfl: 3   pioglitazone-metformin (ACTOPLUS MET) 15-850 MG tablet, TAKE 1 TABLET BY MOUTH TWICE DAILY( EVERY TWELVE HOURS), Disp: 180 tablet, Rfl: 3   pioglitazone-metformin (ACTOPLUS MET) 15-850 MG tablet, Take 1 tablet by mouth every 12 hours, Disp: 180 tablet, Rfl: 3   potassium chloride SA (KLOR-CON M) 20 MEQ tablet, TAKE 1 TABLET(20 MEQ) BY MOUTH DAILY, Disp: 90 tablet, Rfl: 3   potassium chloride SA (KLOR-CON M) 20 MEQ tablet, Take 1 tablet (20 mEq total) by mouth daily., Disp: 90 tablet, Rfl: 2   pravastatin (PRAVACHOL) 40 MG tablet, Take 1 tablet (40 mg total) by mouth daily., Disp: 90 tablet, Rfl:  3   RSV vaccine recomb adjuvanted (AREXVY) 120 MCG/0.5ML injection, Inject into the muscle., Disp: 0.5 mL, Rfl: 0   Semaglutide, 2 MG/DOSE, (OZEMPIC, 2 MG/DOSE,) 8 MG/3ML SOPN, Inject 2 mg into the skin once a week., Disp: 9 mL, Rfl: 5   tamsulosin (FLOMAX) 0.4 MG CAPS capsule, Take 1 capsule (0.4 mg total) by mouth every morning., Disp: 90 capsule, Rfl: 3   tamsulosin (FLOMAX) 0.4 MG CAPS capsule, Take 1 capsule (0.4 mg total) by mouth every morning., Disp: 90 capsule, Rfl: 3   Medications ordered in this encounter:   No orders of the defined types were placed in this encounter.    *If you need refills on other medications prior to your next appointment, please contact your pharmacy*  Follow-Up: Call back or seek an in-person evaluation if the symptoms worsen or if the condition fails to improve as anticipated.  [facility] [phone]  Other Instructions Please use link below to get scheduled this evening at an in person urgent care. Again, if they are unable to see you in a timely fashion or if there are any new or worsening symptoms, you have to go to an emergency room for evaluation. Please do not delay care!   If you have been instructed to have an in-person evaluation today at a local Urgent Care facility, please use the link below. It will take you to a list of all of our available [facility], including address, phone number and hours of operation. Please do not delay care.  [facility]  If you or a family member do not have a primary care provider, use the link below to schedule a visit and establish care. When you choose a [facility] primary care physician or advanced practice provider, you gain a long-term partner in health. Find a Primary Care Provider  Learn more about [facility]'s in-office and virtual care options: [facility]

## 2022-08-22 ENCOUNTER — Other Ambulatory Visit (HOSPITAL_BASED_OUTPATIENT_CLINIC_OR_DEPARTMENT_OTHER): Payer: Self-pay

## 2022-08-22 ENCOUNTER — Ambulatory Visit
Admission: RE | Admit: 2022-08-22 | Discharge: 2022-08-22 | Disposition: A | Discharge status: Home / Self Care | Payer: Medicare PPO | Source: Ambulatory Visit | Attending: Emergency Medicine | Admitting: Emergency Medicine

## 2022-08-22 VITALS — BP 132/86 | HR 103 | Temp 98.5°F | Resp 20

## 2022-08-22 DIAGNOSIS — N10 Acute tubulo-interstitial nephritis: Secondary | ICD-10-CM | POA: Insufficient documentation

## 2022-08-22 DIAGNOSIS — R509 Fever, unspecified: Secondary | ICD-10-CM | POA: Diagnosis not present

## 2022-08-22 DIAGNOSIS — E1165 Type 2 diabetes mellitus with hyperglycemia: Secondary | ICD-10-CM | POA: Diagnosis not present

## 2022-08-22 LAB — POCT URINALYSIS DIP (MANUAL ENTRY)
Blood, UA: NEGATIVE
Glucose, UA: NEGATIVE mg/dL
Leukocytes, UA: NEGATIVE
Nitrite, UA: POSITIVE — AB
Protein Ur, POC: 100 mg/dL — AB
Spec Grav, UA: >=1.03 — AB (ref 1.010–1.025)
Urobilinogen, UA: 0.2 E.U./dL
pH, UA: 5.5 (ref 5.0–8.0)

## 2022-08-22 LAB — POCT INFLUENZA A/B
Influenza A, POC: NEGATIVE
Influenza B, POC: NEGATIVE

## 2022-08-22 LAB — POCT FASTING CBG KUC MANUAL ENTRY: POCT Glucose (KUC): 142 mg/dL — AB (ref 70–99)

## 2022-08-22 MED ORDER — SULFAMETHOXAZOLE-TRIMETHOPRIM 800-160 MG PO TABS
1.0000 | ORAL_TABLET | Freq: Two times a day (BID) | ORAL | 0 refills | Status: AC
  Filled 2022-08-22 (×2): qty 20, 10d supply, fill #0

## 2022-08-22 MED ORDER — PIOGLITAZONE HCL 30 MG PO TABS: 30.0000 mg | ORAL_TABLET | Freq: Every day | ORAL | 0 refills | Status: DC

## 2022-08-22 MED ORDER — PIOGLITAZONE HCL 30 MG PO TABS
30.0000 mg | ORAL_TABLET | Freq: Every day | ORAL | 0 refills | Status: AC
  Filled 2022-08-22 – 2022-09-19 (×2): qty 90, 90d supply, fill #0

## 2022-08-22 MED ORDER — OZEMPIC (0.25 OR 0.5 MG/DOSE) 2 MG/3ML ~~LOC~~ SOPN
PEN_INJECTOR | SUBCUTANEOUS | 1 refills | Status: AC
  Filled 2022-08-22: qty 3, 42d supply, fill #0

## 2022-08-22 MED ORDER — SULFAMETHOXAZOLE-TRIMETHOPRIM 800-160 MG PO TABS: 1.0000 | ORAL_TABLET | Freq: Two times a day (BID) | ORAL | 0 refills | Status: DC

## 2022-08-22 MED ORDER — OZEMPIC (0.25 OR 0.5 MG/DOSE) 2 MG/3ML ~~LOC~~ SOPN: PEN_INJECTOR | SUBCUTANEOUS | 1 refills | Status: DC

## 2022-08-22 NOTE — ED Provider Notes (Signed)
[facility]    CSN: [id number] Arrival date & time: 08/22/22  1021    HISTORY   Chief Complaint  Patient presents with   Nausea    Onset of nausea, vomiting, chills, frequent urination - Entered by patient   HPI [name] [name] is a pleasant, 71 y.o. male who presents to urgent care today. Patient presents with wife today.  Patient states he has been feeling nauseated, having chills and subjective fever accompanied by increased frequency of urination.  Patient states that he is a type II diabetic, not well-controlled, and began taking Ozempic 2 days ago.  Patient states his symptoms began after taking Ozempic.  Patient's wife states that his doctor started him on Ozempic at 2 g weekly which she knows is not the starting dose.  Wife states his blood sugar levels have been between 95 and 125 at home.  Blood glucose level today is 142.  Patient's last A1c was 8.3 in April 2023.  Patient is afebrile at this time but does have an elevated heart rate.  Patient states he has not been vomiting or having diarrhea.  Patient states he does not tolerate metformin well and is unable to take his prescription for Actos-metformin due to his symptoms and therefore has been off of diabetic medications for the past 48 hours with the exception of his Ozempic injection which he had 2 days ago.  The history is provided by the patient and the spouse.   Past Medical History:  Diagnosis Date   Allergy    Bilateral renal cysts    benign/ Patient denies   COPD (chronic obstructive pulmonary disease) ([place])     PFT 2010 due to previous 20y h/o smoking   DDD (degenerative disc disease), lumbar    Diabetes mellitus type II    ED (erectile dysfunction) of organic origin    First degree heart block    History of colon polyps    History of prostate cancer current PSA per pt 0.13   dx March 2014---  s/p radiactive prostate seed implants and intraop radiotherapy in [place], [place]   Hyperlipidemia     Hypertension    Liver mass 06/2013   OVER HALF OF LIVER REMOVED/ BENIGN   OSA on CPAP    Osteoarthritis    PAF (paroxysmal atrial fibrillation) [facility])    cardiologist--  dr Percival Spanish  (episode prior to liver surgery at [facility] 2014 w/ post-op RVR)   Prostate cancer ([place]) 11-11-2012   s/p chemo, radiation    Rectal cancer ([place])    01-25-2015----per pathology --  complete rectal polypectomy-- showed  invasive carcinoma rising in tubular adnemoa high grade hyperplasia--  per dr Ardis Hughs per MRI  no other area seen -- will have colonoscopy done in 3 months   Sleep apnea    wears cpap   Ulcer    STOMACH ULCER   Wears glasses    Patient Active Problem List   Diagnosis Date Noted   Intermittent diarrhea 09/09/2020   TIA (transient ischemic attack) 12/08/2019   AC joint pain 09/22/2019   Biceps tendinitis 09/25/2018   Melena 06/22/2016   Fatigue 08/10/2015   Erectile dysfunction 04/25/2015   Anemia due to chronic blood loss 12/21/2014   GI bleed 12/21/2014   Chest pain at rest 09/30/2014   Edema 07/13/2014   Allergic rhinitis 03/17/2014   PAF (paroxysmal atrial fibrillation) ([place]) 05/25/2013   Cardiomegaly 12/12/2012   Liver mass 12/12/2012   Prostate cancer ([place]) 12/11/2012  Hepatic cyst 12/11/2012   Hematuria, gross 09/05/2012   Knee osteoarthritis 09/05/2012   OSA on CPAP 09/05/2012   Snoring 09/07/2011   Paresthesia of left arm 07/06/2011   Gout attack 03/13/2011   Well adult exam 03/03/2011   Arthritis of knee 03/03/2011   MENTAL CONFUSION 03/09/2010   GAIT DISTURBANCE 03/09/2010   HEAT STROKE 03/09/2010   FOOT PAIN 11/08/2009   CHEST PAIN UNSPECIFIED 08/19/2009   COPD (chronic obstructive pulmonary disease) ([place]) 07/08/2009   TOBACCO USE, QUIT 07/08/2009   RECTAL BLEEDING 12/07/2008   PERSONAL HISTORY OF COLONIC POLYPS 12/07/2008   Dyslipidemia 03/02/2008   HEMATOCHEZIA 03/02/2008   COUGH 03/02/2008   HYPOKALEMIA 02/03/2008   Acute sinusitis 02/03/2008    SYNCOPE 02/03/2008   DM (diabetes mellitus), type 2 with complications ([place]) 02/24/7627   Shoulder pain 11/27/2007   ERECTILE DYSFUNCTION 10/30/2007   Essential hypertension 10/30/2007   OSTEOARTHRITIS 10/30/2007   Bilateral low back pain with bilateral sciatica 10/30/2007   Past Surgical History:  Procedure Laterality Date    RIGHT LOBE HEPATECTOMY  06-12-2013    [facility]   benign mass   CARDIOVASCULAR STRESS TEST  10-01-2014   dr hochrein   normal perfusion study, no ischemia,  normal LV function and wall motion, ef 70%   COLONOSCOPY  2019   ESOPHAGOGASTRODUODENOSCOPY N/A 06/23/2016   Procedure: ESOPHAGOGASTRODUODENOSCOPY (EGD);  Surgeon: Manus Gunning, MD;  Location: [place] [facility];  Service: Gastroenterology;  Laterality: N/A;   EUS N/A 02/10/2015   Procedure: LOWER ENDOSCOPIC ULTRASOUND (EUS);  Surgeon: Milus Banister, MD;  Location[facility];  Service: Endoscopy;  Laterality: N/A;   KNEE ARTHROSCOPY Bilateral 1990's   LUMBAR FUSION  2001   PENILE PROSTHESIS IMPLANT N/A 04/25/2015   Procedure: PENILE PROSTHESIS THREE PIECE INFLATABLE( COLOPLAST) SCROTAL APPROACH;  Surgeon: Kathie Rhodes, MD;  Location: [facility];  Service: Urology;  Laterality: N/A;   POLYPECTOMY     RADIOACTIVE PROSTATE SEED IMPLANTS  May 2014  in [place], [place] Right 02/21/2016   TOTAL KNEE ARTHROPLASTY Left 06/2012   TOTAL KNEE ARTHROPLASTY Right 2017   TRANSTHORACIC ECHOCARDIOGRAM  12/26/2012   mild focal basal septal hypertrophy,  grade II diastolic dysfunction,  ef 55-60%,  mild LAE,  trivial MR and TR   UPPER GASTROINTESTINAL ENDOSCOPY     VASECTOMY  1980's w/ gen. anes.    Home Medications    Prior to Admission medications   Medication Sig Start Date End Date Taking? Authorizing Provider  acetaminophen-codeine (TYLENOL #3) 300-30 MG tablet Take by mouth every 4 (four) hours as needed for moderate pain.    [provider]   amLODipine-benazepril (LOTREL) 10-40 MG capsule Take 1 capsule by mouth once daily 06/05/22   Plotnikov, Evie Lacks, MD  ASPIRIN LOW DOSE 81 MG chewable tablet Chew by mouth. 04/26/21   [provider]  blood glucose meter kit and supplies KIT Dispense based on patient and insurance preference. Use up to four times daily as directed. (FOR ICD-9 250.00, 250.01). 08/23/17   Plotnikov, Evie Lacks, MD  COVID-19 mRNA vaccine [phone] (COMIRNATY) SUSP injection Inject into the muscle. 06/15/22   Carlyle Basques, MD  diclofenac Sodium (VOLTAREN) 1 % GEL APPLY TOPICALLY TO THE AFFECTED AREA FOUR TIMES DAILY 10/23/21   Plotnikov, Evie Lacks, MD  EPINEPHrine 0.3 mg/0.3 mL IJ SOAJ injection SMARTSIG:1 pre-filled pen syringe IM Once 04/04/22   [provider]  fluticasone (FLONASE) 50 MCG/ACT nasal spray USE ONE SPRAY  IN [facility] NOSTRIL DAILY 09/22/19   Plotnikov, Evie Lacks, MD  furosemide (LASIX) 20 MG tablet Take 1-2 tablets (20-40 mg total) by mouth daily as needed for edema. 09/22/19   Plotnikov, Evie Lacks, MD  hydrochlorothiazide (HYDRODIURIL) 25 MG tablet Take 1 tablet (25 mg total) by mouth daily. 12/12/21   Plotnikov, Evie Lacks, MD  indomethacin (INDOCIN) 50 MG capsule Take 1 capsule (50 mg total) by mouth 3 (three) times daily as needed for moderate pain or gout 07/31/22   Plotnikov, Evie Lacks, MD  influenza vaccine adjuvanted (FLUAD) 0.5 ML injection Inject into the muscle. 06/26/22   Carlyle Basques, MD  metoprolol tartrate (LOPRESSOR) 50 MG tablet Take 1 tablet (50 mg total) by mouth daily. Follow-up appt due in Oct must see provider for future refills 12/12/21   Plotnikov, Evie Lacks, MD  Multiple Vitamin (MULTIVITAMIN) tablet Take 1 tablet by mouth daily.    [provider]  Omega-3 Fatty Acids (FISH OIL PO) Take 2 tablets by mouth daily.    [provider]  pantoprazole (PROTONIX) 40 MG tablet Take 1 tablet (40 mg total) by mouth 2 (two) times daily. 12/12/21   Plotnikov,  Evie Lacks, MD  pioglitazone (ACTOS) 30 MG tablet Take 1 tablet (30 mg total) by mouth daily. 08/22/22 11/20/22  Lynden [place] Scales, PA-C  potassium chloride SA (KLOR-CON M) 20 MEQ tablet TAKE 1 TABLET(20 MEQ) BY MOUTH DAILY 12/12/21   Plotnikov, Evie Lacks, MD  potassium chloride SA (KLOR-CON M) 20 MEQ tablet Take 1 tablet (20 mEq total) by mouth daily. 04/12/22   Plotnikov, Evie Lacks, MD  pravastatin (PRAVACHOL) 40 MG tablet Take 1 tablet (40 mg total) by mouth daily. 12/12/21   Plotnikov, Evie Lacks, MD  RSV vaccine recomb adjuvanted (AREXVY) 120 MCG/0.5ML injection Inject into the muscle. 08/10/22   Carlyle Basques, MD  Semaglutide,0.25 or 0.5MG/DOS, (OZEMPIC, 0.25 OR 0.5 MG/DOSE,) 2 MG/3ML SOPN Inject 0.25 mg into the skin once a week for 28 days, THEN 0.5 mg once a week for 28 days. 08/22/22 10/17/22  Lynden [place] Scales, PA-C  sulfamethoxazole-trimethoprim (BACTRIM DS) 800-160 MG tablet Take 1 tablet by mouth 2 (two) times daily for 10 days. 08/22/22 09/01/22  Lynden [place] Scales, PA-C  tamsulosin (FLOMAX) 0.4 MG CAPS capsule Take 1 capsule (0.4 mg total) by mouth every morning. 12/12/21   Plotnikov, Evie Lacks, MD  tamsulosin (FLOMAX) 0.4 MG CAPS capsule Take 1 capsule (0.4 mg total) by mouth every morning. 12/12/21   Plotnikov, Evie Lacks, MD    Family History Family History  Problem Relation Age of Onset   Diabetes Mother    Heart disease Mother        Pacemaker   Pancreatic cancer Father    Stomach cancer Father    Stomach cancer Sister    Pancreatic cancer Sister    Hypertension Other    Colon polyps Neg Hx    Rectal cancer Neg Hx    Esophageal cancer Neg Hx    Colon cancer Neg Hx    Social History Social History   Tobacco Use   Smoking status: Former    Packs/day: 2.00    Years: 15.00    Total pack years: 30.00    Types: Cigarettes    Quit date: 09/04/1999    Years since quitting: 22.9   Smokeless tobacco: Never  Vaping Use   Vaping Use: Never used  Substance Use  Topics   Alcohol use: Yes    Comment: Occasional   Drug  use: No   Allergies   Benazepril hcl and Sildenafil  Review of Systems Review of Systems Pertinent findings revealed after performing a 14 point review of systems has been noted in the history of present illness.  Physical Exam Triage Vital Signs ED Triage Vitals  Enc Vitals Group     BP 06/30/21 0827 (!) 147/82     Pulse Rate 06/30/21 0827 72     Resp 06/30/21 0827 18     Temp 06/30/21 0827 98.3 F (36.8 C)     Temp Source 06/30/21 0827 Oral     SpO2 06/30/21 0827 98 %     Weight --      Height --      Head Circumference --      Peak Flow --      Pain Score 06/30/21 0826 5     Pain Loc --      Pain Edu? --      Excl. in [place]? --   No data found.  Updated Vital Signs BP 132/86 (BP Location: Left Arm)   Pulse (!) 103   Temp 98.5 F (36.9 C) (Oral)   Resp 20   SpO2 98%   Physical Exam Vitals and nursing note reviewed.  Constitutional:      General: He is not in acute distress.    Appearance: Normal appearance. He is normal weight. He is not ill-appearing.  HENT:     Head: Normocephalic and atraumatic.  Eyes:     Extraocular Movements: Extraocular movements intact.     Conjunctiva/sclera: Conjunctivae normal.     Pupils: Pupils are equal, round, and reactive to light.  Cardiovascular:     Rate and Rhythm: Normal rate and regular rhythm.  Pulmonary:     Effort: Pulmonary effort is normal.     Breath sounds: Normal breath sounds.  Abdominal:     General: Abdomen is flat. Bowel sounds are normal. There is no distension.     Palpations: Abdomen is soft. There is no mass.     Tenderness: There is no abdominal tenderness. There is left CVA tenderness. There is no right CVA tenderness, guarding or rebound.     Hernia: No hernia is present.  Musculoskeletal:        General: Normal range of motion.     Cervical back: Normal range of motion and neck supple.  Skin:    General: Skin is warm and dry.   Neurological:     General: No focal deficit present.     Mental Status: He is alert and oriented to person, place, and time. Mental status is at baseline.  Psychiatric:        Mood and Affect: Mood normal.        Behavior: Behavior normal.        Thought Content: Thought content normal.        Judgment: Judgment normal.     Visual Acuity Right Eye Distance:   Left Eye Distance:   Bilateral Distance:    Right Eye Near:   Left Eye Near:    Bilateral Near:     UC Couse / Diagnostics / Procedures:     Radiology No results found.  Procedures Procedures (including critical care time) EKG  Pending results:  Labs Reviewed  POCT URINALYSIS DIP (MANUAL ENTRY) - Abnormal; Notable for the following components:      Result Value   Color, UA straw (*)    Bilirubin, UA moderate (*)    Ketones,  POC UA moderate (40) (*)    Spec Grav, UA >=1.030 (*)    Protein Ur, POC =100 (*)    Nitrite, UA Positive (*)    All other components within normal limits  POCT FASTING CBG [facility] MANUAL ENTRY - Abnormal; Notable for the following components:   POCT Glucose ([facility]) 142 (*)    All other components within normal limits  URINE CULTURE  POCT INFLUENZA A/B    Medications Ordered in UC: Medications - No data to display  UC Diagnoses / Final Clinical Impressions(s)   I have reviewed the triage vital signs and the nursing notes.  Pertinent labs & imaging results that were available during my care of the patient were reviewed by me and considered in my medical decision making (see chart for details).    Final diagnoses:  Acute pyelonephritis  Type 2 diabetes mellitus with hyperglycemia, without long-term current use of insulin (HCC)  Fever with chills   Urine dip today revealed nitrites, pyuria, protein, cloudy appearance, and elevated specific gravity.   Patient was advised to begin antibiotics now due to findings on urine dip. Patient was advised to begin antibiotics today due to having  active symptoms of urinary tract infection.                    Patient was advised to take all doses exactly as prescribed.  Patient also advised of risks of worsening infection with incomplete antibiotic therapy. Patient advised that they will be contacted with results and that adjustments to treatment will be provided as indicated based on the results.   Patient was advised of possibility that urine culture results may be negative if sample provided was obtained late in the day causing urine to be more diluted.  Patient was advised that if antibiotics were effective after the first 24 to 36 hours, despite negative urine culture result, it is recommended that they complete the full course as prescribed.   Patient provided with a renewal of Ozempic so that he can begin taking at the starting dose of 0.25 mg weekly for 4 weeks then increase to 0.5 mg for 4 weeks.  Actos-metformin was changed to plain Actos due to patient stating that metformin gives him diarrhea.  Patient advised to follow-up with PCP for further refills. Return precautions advised.  Please see discharge instructions below for details of plan of care as provided to patient. ED Prescriptions     Medication Sig Dispense Auth. Provider   sulfamethoxazole-trimethoprim (BACTRIM DS) 800-160 MG tablet  (Status: Discontinued) Take 1 tablet by mouth 2 (two) times daily for 10 days. 20 tablet Lynden [place] Scales, PA-C   Semaglutide,0.25 or 0.5MG/DOS, (OZEMPIC, 0.25 OR 0.5 MG/DOSE,) 2 MG/3ML SOPN  (Status: Discontinued) Inject 0.25 mg into the skin once a week for 28 days, THEN 0.5 mg once a week for 28 days. 3 mL [place] Scales, PA-C   pioglitazone (ACTOS) 30 MG tablet  (Status: Discontinued) Take 1 tablet (30 mg total) by mouth daily. 90 tablet [place] Scales, PA-C   pioglitazone (ACTOS) 30 MG tablet Take 1 tablet (30 mg total) by mouth daily. 90 tablet [place] Scales, PA-C   Semaglutide,0.25 or 0.5MG/DOS, (OZEMPIC,  0.25 OR 0.5 MG/DOSE,) 2 MG/3ML SOPN Inject 0.25 mg into the skin once a week for 28 days, THEN 0.5 mg once a week for 28 days. 3 mL [place] Scales, PA-C   sulfamethoxazole-trimethoprim (BACTRIM DS) 800-160 MG tablet Take 1 tablet by mouth 2 (  two) times daily for 10 days. 20 tablet [place] Scales, PA-C      PDMP not reviewed this encounter.  Disposition Upon Discharge:  Condition: stable for discharge home  Patient presented with concern for an acute illness with associated systemic symptoms and significant discomfort requiring urgent management. In my opinion, this is a condition that a prudent lay person (someone who possesses an average knowledge of health and medicine) may potentially expect to result in complications if not addressed urgently such as respiratory distress, impairment of bodily function or dysfunction of bodily organs.   As such, the patient has been evaluated and assessed, work-up was performed and treatment was provided in alignment with urgent care protocols and evidence based medicine.  Patient/parent/caregiver has been advised that the patient may require follow up for further testing and/or treatment if the symptoms continue in spite of treatment, as clinically indicated and appropriate.  Routine symptom specific, illness specific and/or disease specific instructions were discussed with the patient and/or caregiver at length.  Prevention strategies for avoiding STD exposure were also discussed.  The patient will follow up with their current PCP if and as advised. If the patient does not currently have a PCP we will assist them in obtaining one.   The patient may need specialty follow up if the symptoms continue, in spite of conservative treatment and management, for further workup, evaluation, consultation and treatment as clinically indicated and appropriate.  Patient/parent/caregiver verbalized understanding and agreement of plan as discussed.  All  questions were addressed during visit.  Please see discharge instructions below for further details of plan.  Discharge Instructions:   Discharge Instructions      To treat your urinary tract infection, I have sent a prescription for a 10-day course of Bactrim to your pharmacy.  Please take 1 tablet twice daily.  If you have a robust response within the first 48 hours, you can discontinue Bactrim after 7 days.  If you do not have a robust response within the first 48 hours, please finish the full 10-day course.  For your diabetes, please discontinue prior diabetes medications and begin Actos once daily and Ozempic 0.25 mg once weekly for 4 weeks and increase to 0.5 mg weekly for the following 4 weeks.  I have sent new prescriptions to your pharmacy.  I enclosed some information about diabetes and nutrition for a quick refresher.  Please follow-up with your new primary care provider soon as possible.  Thank you for visiting urgent care today.      This office note has been dictated using Museum/gallery curator.  Unfortunately, this method of dictation can sometimes lead to typographical or grammatical errors.  I apologize for your inconvenience in advance if this occurs.  Please do not hesitate to reach out to me if clarification is needed.       [place] Scales, PA-C 08/22/22 1339

## 2022-08-22 NOTE — ED Triage Notes (Addendum)
Pt reports feeling nauseated, chills, fever, frequent urination. The patient reports Monday was his first time taking Ozempic (symptoms began after taking). The patients family member states the patients glucose levels were WNL (99-125).  Started: Monday

## 2022-08-22 NOTE — Discharge Instructions (Signed)
To treat your urinary tract infection, I have sent a prescription for a 10-day course of Bactrim to your pharmacy.  Please take 1 tablet twice daily.  If you have a robust response within the first 48 hours, you can discontinue Bactrim after 7 days.  If you do not have a robust response within the first 48 hours, please finish the full 10-day course.  For your diabetes, please discontinue prior diabetes medications and begin Actos once daily and Ozempic 0.25 mg once weekly for 4 weeks and increase to 0.5 mg weekly for the following 4 weeks.  I have sent new prescriptions to your pharmacy.  I enclosed some information about diabetes and nutrition for a quick refresher.  Please follow-up with your new primary care provider soon as possible.  Thank you for visiting urgent care today.

## 2022-08-23 ENCOUNTER — Telehealth: Payer: Self-pay | Admitting: Internal Medicine

## 2022-08-23 NOTE — Telephone Encounter (Signed)
Pharmacist, community at [place] [facility] [place] [facility] [place] [facility] Provider [name]- [place] Contact Type Call Who Is Calling Patient / Member / Family / Caregiver Caller Name [place] Phone Number [phone] Patient Name [name] [name] Patient DOB [birth year] Call Type Message Only Information Provided Reason for Call Request to Schedule Office Appointment Initial Comment Caller states she would like [name] to call her. She is at Urgent Care with her husband and wants him to be scheduled an appointment. Patient request to speak to RN No Additional Comment Office hours provided. Disp. Time Disposition Final User 08/22/2022 12:33:41 PM General Information Provided Yes [name] Call Closed By: [name] Transaction Date/Time: 08/22/2022 12:30:37 PM (ET)

## 2022-08-24 LAB — URINE CULTURE: Culture: NO GROWTH

## 2022-09-18 DIAGNOSIS — C61 Malignant neoplasm of prostate: Secondary | ICD-10-CM | POA: Diagnosis not present

## 2022-09-19 ENCOUNTER — Other Ambulatory Visit: Payer: Self-pay | Admitting: Internal Medicine

## 2022-09-19 ENCOUNTER — Other Ambulatory Visit (HOSPITAL_BASED_OUTPATIENT_CLINIC_OR_DEPARTMENT_OTHER): Payer: Self-pay

## 2022-09-20 ENCOUNTER — Other Ambulatory Visit (HOSPITAL_BASED_OUTPATIENT_CLINIC_OR_DEPARTMENT_OTHER): Payer: Self-pay

## 2022-09-20 MED ORDER — ACETAMINOPHEN-CODEINE 300-30 MG PO TABS
1.0000 | ORAL_TABLET | Freq: Four times a day (QID) | ORAL | 0 refills | Status: AC | PRN
  Filled 2022-09-20: qty 20, 5d supply, fill #0

## 2022-09-25 ENCOUNTER — Other Ambulatory Visit (HOSPITAL_BASED_OUTPATIENT_CLINIC_OR_DEPARTMENT_OTHER): Payer: Self-pay

## 2022-09-25 DIAGNOSIS — R3915 Urgency of urination: Secondary | ICD-10-CM | POA: Diagnosis not present

## 2022-09-25 DIAGNOSIS — C61 Malignant neoplasm of prostate: Secondary | ICD-10-CM | POA: Diagnosis not present

## 2022-09-25 DIAGNOSIS — N5201 Erectile dysfunction due to arterial insufficiency: Secondary | ICD-10-CM | POA: Diagnosis not present

## 2022-09-25 MED ORDER — TAMSULOSIN HCL 0.4 MG PO CAPS
0.4000 mg | ORAL_CAPSULE | Freq: Every day | ORAL | 3 refills | Status: AC
  Filled 2023-03-18: qty 90, 90d supply, fill #0

## 2022-10-19 ENCOUNTER — Other Ambulatory Visit (HOSPITAL_BASED_OUTPATIENT_CLINIC_OR_DEPARTMENT_OTHER): Payer: Self-pay

## 2022-11-12 DIAGNOSIS — L603 Nail dystrophy: Secondary | ICD-10-CM | POA: Diagnosis not present

## 2022-11-12 DIAGNOSIS — I70203 Unspecified atherosclerosis of native arteries of extremities, bilateral legs: Secondary | ICD-10-CM | POA: Diagnosis not present

## 2022-11-12 DIAGNOSIS — E1142 Type 2 diabetes mellitus with diabetic polyneuropathy: Secondary | ICD-10-CM | POA: Diagnosis not present

## 2022-11-12 DIAGNOSIS — I739 Peripheral vascular disease, unspecified: Secondary | ICD-10-CM | POA: Diagnosis not present

## 2022-11-12 DIAGNOSIS — B351 Tinea unguium: Secondary | ICD-10-CM | POA: Diagnosis not present

## 2022-11-12 DIAGNOSIS — E1151 Type 2 diabetes mellitus with diabetic peripheral angiopathy without gangrene: Secondary | ICD-10-CM | POA: Diagnosis not present

## 2022-11-13 ENCOUNTER — Telehealth: Payer: Self-pay

## 2022-11-13 NOTE — Telephone Encounter (Signed)
Called patient to schedule Medicare Annual Wellness Visit (AWV). Left message for patient to call back and schedule Medicare Annual Wellness Visit (AWV).  Last date of AWV: 11/20/21  Please schedule an AWV-S appointment at any time with Norton Blizzard, [place] ([place])  [place] [phone] .   Norton Blizzard, [place] ([facility])  [place] [facility] [phone]

## 2022-11-14 NOTE — Telephone Encounter (Signed)
Pt will no longer be under the care the provider as the pt has stated he is now going to [facility] for his health care needs.

## 2022-11-15 ENCOUNTER — Other Ambulatory Visit (HOSPITAL_BASED_OUTPATIENT_CLINIC_OR_DEPARTMENT_OTHER): Payer: Self-pay

## 2022-11-15 DIAGNOSIS — C20 Malignant neoplasm of rectum: Secondary | ICD-10-CM | POA: Diagnosis not present

## 2022-11-15 DIAGNOSIS — E1169 Type 2 diabetes mellitus with other specified complication: Secondary | ICD-10-CM | POA: Diagnosis not present

## 2022-11-15 DIAGNOSIS — I70203 Unspecified atherosclerosis of native arteries of extremities, bilateral legs: Secondary | ICD-10-CM | POA: Diagnosis not present

## 2022-11-15 DIAGNOSIS — I10 Essential (primary) hypertension: Secondary | ICD-10-CM | POA: Diagnosis not present

## 2022-11-15 DIAGNOSIS — M109 Gout, unspecified: Secondary | ICD-10-CM | POA: Diagnosis not present

## 2022-11-15 DIAGNOSIS — I480 Paroxysmal atrial fibrillation: Secondary | ICD-10-CM | POA: Diagnosis not present

## 2022-11-15 DIAGNOSIS — E785 Hyperlipidemia, unspecified: Secondary | ICD-10-CM | POA: Diagnosis not present

## 2022-11-15 DIAGNOSIS — Z8546 Personal history of malignant neoplasm of prostate: Secondary | ICD-10-CM | POA: Diagnosis not present

## 2022-11-15 MED ORDER — ROSUVASTATIN CALCIUM 10 MG PO TABS
10.0000 mg | ORAL_TABLET | Freq: Every day | ORAL | 3 refills | Status: AC
  Filled 2022-11-15: qty 90, 90d supply, fill #0
  Filled 2023-02-04: qty 90, 90d supply, fill #1

## 2022-11-15 MED ORDER — METFORMIN HCL ER 500 MG PO TB24
ORAL_TABLET | ORAL | 3 refills | Status: AC
  Filled 2022-11-15: qty 180, 90d supply, fill #0
  Filled 2023-02-04: qty 180, 90d supply, fill #1

## 2022-11-16 ENCOUNTER — Other Ambulatory Visit (HOSPITAL_BASED_OUTPATIENT_CLINIC_OR_DEPARTMENT_OTHER): Payer: Self-pay

## 2022-11-30 ENCOUNTER — Other Ambulatory Visit (HOSPITAL_BASED_OUTPATIENT_CLINIC_OR_DEPARTMENT_OTHER): Payer: Self-pay

## 2023-01-02 ENCOUNTER — Other Ambulatory Visit: Payer: Self-pay | Admitting: Internal Medicine

## 2023-01-03 ENCOUNTER — Other Ambulatory Visit (HOSPITAL_BASED_OUTPATIENT_CLINIC_OR_DEPARTMENT_OTHER): Payer: Self-pay

## 2023-01-03 ENCOUNTER — Other Ambulatory Visit: Payer: Self-pay

## 2023-01-03 MED ORDER — AMLODIPINE-OLMESARTAN 10-40 MG PO TABS
1.0000 | ORAL_TABLET | Freq: Every day | ORAL | 0 refills | Status: DC
  Filled 2023-01-03: qty 30, 30d supply, fill #0

## 2023-01-25 ENCOUNTER — Other Ambulatory Visit (HOSPITAL_BASED_OUTPATIENT_CLINIC_OR_DEPARTMENT_OTHER): Payer: Self-pay

## 2023-01-25 DIAGNOSIS — E1141 Type 2 diabetes mellitus with diabetic mononeuropathy: Secondary | ICD-10-CM | POA: Diagnosis not present

## 2023-01-25 DIAGNOSIS — E1169 Type 2 diabetes mellitus with other specified complication: Secondary | ICD-10-CM | POA: Diagnosis not present

## 2023-01-25 DIAGNOSIS — K529 Noninfective gastroenteritis and colitis, unspecified: Secondary | ICD-10-CM | POA: Diagnosis not present

## 2023-01-25 DIAGNOSIS — R739 Hyperglycemia, unspecified: Secondary | ICD-10-CM | POA: Diagnosis not present

## 2023-01-25 MED ORDER — PIOGLITAZONE HCL 15 MG PO TABS
15.0000 mg | ORAL_TABLET | Freq: Every day | ORAL | 0 refills | Status: DC
  Filled 2023-01-25: qty 30, 30d supply, fill #0

## 2023-01-29 DIAGNOSIS — E1169 Type 2 diabetes mellitus with other specified complication: Secondary | ICD-10-CM | POA: Diagnosis not present

## 2023-01-29 DIAGNOSIS — E1142 Type 2 diabetes mellitus with diabetic polyneuropathy: Secondary | ICD-10-CM | POA: Diagnosis not present

## 2023-01-29 DIAGNOSIS — R739 Hyperglycemia, unspecified: Secondary | ICD-10-CM | POA: Diagnosis not present

## 2023-02-04 ENCOUNTER — Other Ambulatory Visit (HOSPITAL_BASED_OUTPATIENT_CLINIC_OR_DEPARTMENT_OTHER): Payer: Self-pay

## 2023-02-04 ENCOUNTER — Other Ambulatory Visit: Payer: Self-pay | Admitting: Internal Medicine

## 2023-02-04 MED ORDER — POTASSIUM CHLORIDE CRYS ER 20 MEQ PO TBCR
20.0000 meq | EXTENDED_RELEASE_TABLET | Freq: Every day | ORAL | 3 refills | Status: AC
  Filled 2023-02-04 – 2023-03-05 (×2): qty 90, 90d supply, fill #0

## 2023-02-04 MED ORDER — AMLODIPINE-OLMESARTAN 10-40 MG PO TABS
1.0000 | ORAL_TABLET | Freq: Every day | ORAL | 3 refills | Status: AC
  Filled 2023-02-04: qty 90, 90d supply, fill #0
  Filled 2023-05-01: qty 90, 90d supply, fill #1
  Filled 2023-08-10: qty 90, 90d supply, fill #2

## 2023-02-05 ENCOUNTER — Other Ambulatory Visit (HOSPITAL_BASED_OUTPATIENT_CLINIC_OR_DEPARTMENT_OTHER): Payer: Self-pay

## 2023-02-05 ENCOUNTER — Other Ambulatory Visit: Payer: Self-pay

## 2023-02-05 MED ORDER — JARDIANCE 10 MG PO TABS
10.0000 mg | ORAL_TABLET | Freq: Every day | ORAL | 0 refills | Status: DC
  Filled 2023-02-05: qty 30, 30d supply, fill #0

## 2023-02-11 DIAGNOSIS — I70203 Unspecified atherosclerosis of native arteries of extremities, bilateral legs: Secondary | ICD-10-CM | POA: Diagnosis not present

## 2023-02-11 DIAGNOSIS — E1151 Type 2 diabetes mellitus with diabetic peripheral angiopathy without gangrene: Secondary | ICD-10-CM | POA: Diagnosis not present

## 2023-02-11 DIAGNOSIS — L603 Nail dystrophy: Secondary | ICD-10-CM | POA: Diagnosis not present

## 2023-02-11 DIAGNOSIS — L84 Corns and callosities: Secondary | ICD-10-CM | POA: Diagnosis not present

## 2023-02-11 DIAGNOSIS — E1142 Type 2 diabetes mellitus with diabetic polyneuropathy: Secondary | ICD-10-CM | POA: Diagnosis not present

## 2023-02-11 DIAGNOSIS — I739 Peripheral vascular disease, unspecified: Secondary | ICD-10-CM | POA: Diagnosis not present

## 2023-02-11 DIAGNOSIS — B351 Tinea unguium: Secondary | ICD-10-CM | POA: Diagnosis not present

## 2023-02-13 ENCOUNTER — Other Ambulatory Visit (HOSPITAL_BASED_OUTPATIENT_CLINIC_OR_DEPARTMENT_OTHER): Payer: Self-pay

## 2023-02-13 MED ORDER — BLOOD GLUCOSE TEST VI STRP
ORAL_STRIP | 3 refills | Status: AC
  Filled 2023-02-13 – 2023-03-15 (×2): qty 300, 90d supply, fill #0

## 2023-02-14 ENCOUNTER — Other Ambulatory Visit (HOSPITAL_BASED_OUTPATIENT_CLINIC_OR_DEPARTMENT_OTHER): Payer: Self-pay

## 2023-02-14 DIAGNOSIS — E1169 Type 2 diabetes mellitus with other specified complication: Secondary | ICD-10-CM | POA: Diagnosis not present

## 2023-02-14 DIAGNOSIS — R739 Hyperglycemia, unspecified: Secondary | ICD-10-CM | POA: Diagnosis not present

## 2023-02-25 ENCOUNTER — Other Ambulatory Visit (HOSPITAL_BASED_OUTPATIENT_CLINIC_OR_DEPARTMENT_OTHER): Payer: Self-pay

## 2023-03-05 ENCOUNTER — Other Ambulatory Visit (HOSPITAL_BASED_OUTPATIENT_CLINIC_OR_DEPARTMENT_OTHER): Payer: Self-pay

## 2023-03-05 ENCOUNTER — Other Ambulatory Visit: Payer: Self-pay | Admitting: Internal Medicine

## 2023-03-05 ENCOUNTER — Encounter (HOSPITAL_BASED_OUTPATIENT_CLINIC_OR_DEPARTMENT_OTHER): Payer: Self-pay

## 2023-03-06 ENCOUNTER — Other Ambulatory Visit (HOSPITAL_BASED_OUTPATIENT_CLINIC_OR_DEPARTMENT_OTHER): Payer: Self-pay

## 2023-03-06 MED ORDER — JARDIANCE 10 MG PO TABS
10.0000 mg | ORAL_TABLET | Freq: Every day | ORAL | 1 refills | Status: AC
  Filled 2023-03-06: qty 30, 30d supply, fill #0
  Filled 2023-04-14: qty 30, 30d supply, fill #1

## 2023-03-15 ENCOUNTER — Other Ambulatory Visit (HOSPITAL_BASED_OUTPATIENT_CLINIC_OR_DEPARTMENT_OTHER): Payer: Self-pay

## 2023-03-15 MED ORDER — ACCU-CHEK GUIDE W/DEVICE KIT
1.0000 | PACK | Freq: Three times a day (TID) | 1 refills | Status: AC
  Filled 2023-03-15: qty 1, 90d supply, fill #0

## 2023-03-18 ENCOUNTER — Other Ambulatory Visit: Payer: Self-pay

## 2023-03-18 ENCOUNTER — Other Ambulatory Visit (HOSPITAL_BASED_OUTPATIENT_CLINIC_OR_DEPARTMENT_OTHER): Payer: Self-pay

## 2023-03-18 ENCOUNTER — Other Ambulatory Visit: Payer: Self-pay | Admitting: Internal Medicine

## 2023-03-18 MED ORDER — HYDROCHLOROTHIAZIDE 25 MG PO TABS
25.0000 mg | ORAL_TABLET | Freq: Every morning | ORAL | 3 refills | Status: AC
  Filled 2023-03-18: qty 90, 90d supply, fill #0

## 2023-03-18 MED ORDER — PANTOPRAZOLE SODIUM 40 MG PO TBEC
40.0000 mg | DELAYED_RELEASE_TABLET | Freq: Two times a day (BID) | ORAL | 3 refills | Status: AC
  Filled 2023-03-18: qty 180, 90d supply, fill #0

## 2023-03-18 MED ORDER — METOPROLOL TARTRATE 50 MG PO TABS
50.0000 mg | ORAL_TABLET | Freq: Two times a day (BID) | ORAL | 3 refills | Status: DC
  Filled 2023-03-18: qty 180, 90d supply, fill #0

## 2023-03-18 MED ORDER — PIOGLITAZONE HCL 15 MG PO TABS
15.0000 mg | ORAL_TABLET | Freq: Every day | ORAL | 0 refills | Status: DC
  Filled 2023-03-18: qty 30, 30d supply, fill #0

## 2023-03-19 ENCOUNTER — Other Ambulatory Visit (HOSPITAL_BASED_OUTPATIENT_CLINIC_OR_DEPARTMENT_OTHER): Payer: Self-pay

## 2023-03-19 MED ORDER — POTASSIUM CHLORIDE CRYS ER 20 MEQ PO TBCR: 20.0000 meq | EXTENDED_RELEASE_TABLET | Freq: Every day | ORAL | 3 refills | Status: AC

## 2023-03-19 MED ORDER — PIOGLITAZONE HCL 15 MG PO TABS: 15.0000 mg | ORAL_TABLET | Freq: Every day | ORAL | 3 refills | Status: AC

## 2023-03-19 MED ORDER — JARDIANCE 10 MG PO TABS: 10.0000 mg | ORAL_TABLET | Freq: Every day | ORAL | 3 refills | Status: AC

## 2023-03-19 MED ORDER — AMLODIPINE BESYLATE 10 MG PO TABS
10.0000 mg | ORAL_TABLET | Freq: Every day | ORAL | 3 refills | Status: AC
  Filled 2023-03-19: qty 90, 90d supply, fill #0

## 2023-03-19 MED ORDER — ROSUVASTATIN CALCIUM 10 MG PO TABS
10.0000 mg | ORAL_TABLET | Freq: Every day | ORAL | 3 refills | Status: AC
  Filled 2023-05-01: qty 90, 90d supply, fill #0

## 2023-03-19 MED ORDER — FUROSEMIDE 20 MG PO TABS
20.0000 mg | ORAL_TABLET | Freq: Every day | ORAL | 0 refills | Status: AC | PRN
  Filled 2023-03-19: qty 90, 90d supply, fill #0

## 2023-03-19 MED ORDER — BLOOD GLUCOSE TEST VI STRP: 1.0000 | ORAL_STRIP | Freq: Three times a day (TID) | 3 refills | Status: AC

## 2023-03-19 MED ORDER — METFORMIN HCL ER 500 MG PO TB24
1000.0000 mg | ORAL_TABLET | ORAL | 3 refills | Status: AC
  Filled 2023-03-19: qty 270, 90d supply, fill #0

## 2023-03-19 MED ORDER — PANTOPRAZOLE SODIUM 40 MG PO TBEC: 40.0000 mg | DELAYED_RELEASE_TABLET | Freq: Two times a day (BID) | ORAL | 3 refills | Status: AC

## 2023-03-19 MED ORDER — METOPROLOL TARTRATE 50 MG PO TABS: 50.0000 mg | ORAL_TABLET | Freq: Two times a day (BID) | ORAL | 3 refills | Status: AC

## 2023-03-19 MED ORDER — TAMSULOSIN HCL 0.4 MG PO CAPS: 0.4000 mg | ORAL_CAPSULE | Freq: Every day | ORAL | 3 refills | Status: AC

## 2023-03-19 MED ORDER — HYDROCHLOROTHIAZIDE 25 MG PO TABS: 25.0000 mg | ORAL_TABLET | Freq: Every morning | ORAL | 3 refills | Status: AC

## 2023-03-25 ENCOUNTER — Other Ambulatory Visit (HOSPITAL_BASED_OUTPATIENT_CLINIC_OR_DEPARTMENT_OTHER): Payer: Self-pay

## 2023-04-04 DIAGNOSIS — E785 Hyperlipidemia, unspecified: Secondary | ICD-10-CM | POA: Diagnosis not present

## 2023-04-04 DIAGNOSIS — M109 Gout, unspecified: Secondary | ICD-10-CM | POA: Diagnosis not present

## 2023-04-04 DIAGNOSIS — E1169 Type 2 diabetes mellitus with other specified complication: Secondary | ICD-10-CM | POA: Diagnosis not present

## 2023-04-04 DIAGNOSIS — Z8546 Personal history of malignant neoplasm of prostate: Secondary | ICD-10-CM | POA: Diagnosis not present

## 2023-04-04 DIAGNOSIS — R739 Hyperglycemia, unspecified: Secondary | ICD-10-CM | POA: Diagnosis not present

## 2023-04-04 DIAGNOSIS — E1142 Type 2 diabetes mellitus with diabetic polyneuropathy: Secondary | ICD-10-CM | POA: Diagnosis not present

## 2023-04-15 ENCOUNTER — Other Ambulatory Visit: Payer: Self-pay

## 2023-04-15 ENCOUNTER — Other Ambulatory Visit (HOSPITAL_BASED_OUTPATIENT_CLINIC_OR_DEPARTMENT_OTHER): Payer: Self-pay

## 2023-04-15 MED ORDER — INDOMETHACIN 50 MG PO CAPS
50.0000 mg | ORAL_CAPSULE | Freq: Three times a day (TID) | ORAL | 0 refills | Status: AC | PRN
  Filled 2023-04-15: qty 60, 20d supply, fill #0

## 2023-04-15 MED ORDER — PANTOPRAZOLE SODIUM 40 MG PO TBEC
40.0000 mg | DELAYED_RELEASE_TABLET | Freq: Two times a day (BID) | ORAL | 0 refills | Status: AC
  Filled 2023-04-15: qty 180, 90d supply, fill #0

## 2023-04-15 MED ORDER — METFORMIN HCL ER 500 MG PO TB24
1000.0000 mg | ORAL_TABLET | ORAL | 0 refills | Status: DC
  Filled 2023-04-15: qty 270, 90d supply, fill #0

## 2023-04-15 MED ORDER — JARDIANCE 10 MG PO TABS
10.0000 mg | ORAL_TABLET | Freq: Every day | ORAL | 0 refills | Status: AC
  Filled 2023-04-15: qty 90, 90d supply, fill #0

## 2023-04-16 ENCOUNTER — Other Ambulatory Visit (HOSPITAL_BASED_OUTPATIENT_CLINIC_OR_DEPARTMENT_OTHER): Payer: Self-pay

## 2023-04-17 ENCOUNTER — Other Ambulatory Visit (HOSPITAL_BASED_OUTPATIENT_CLINIC_OR_DEPARTMENT_OTHER): Payer: Self-pay

## 2023-04-17 DIAGNOSIS — R82998 Other abnormal findings in urine: Secondary | ICD-10-CM | POA: Diagnosis not present

## 2023-04-17 DIAGNOSIS — I70203 Unspecified atherosclerosis of native arteries of extremities, bilateral legs: Secondary | ICD-10-CM | POA: Diagnosis not present

## 2023-04-17 DIAGNOSIS — E1142 Type 2 diabetes mellitus with diabetic polyneuropathy: Secondary | ICD-10-CM | POA: Diagnosis not present

## 2023-04-17 DIAGNOSIS — Z8546 Personal history of malignant neoplasm of prostate: Secondary | ICD-10-CM | POA: Diagnosis not present

## 2023-04-17 DIAGNOSIS — I10 Essential (primary) hypertension: Secondary | ICD-10-CM | POA: Diagnosis not present

## 2023-04-17 DIAGNOSIS — E1169 Type 2 diabetes mellitus with other specified complication: Secondary | ICD-10-CM | POA: Diagnosis not present

## 2023-04-17 DIAGNOSIS — I700 Atherosclerosis of aorta: Secondary | ICD-10-CM | POA: Diagnosis not present

## 2023-04-17 DIAGNOSIS — R160 Hepatomegaly, not elsewhere classified: Secondary | ICD-10-CM | POA: Diagnosis not present

## 2023-04-17 DIAGNOSIS — E1151 Type 2 diabetes mellitus with diabetic peripheral angiopathy without gangrene: Secondary | ICD-10-CM | POA: Diagnosis not present

## 2023-04-17 DIAGNOSIS — E785 Hyperlipidemia, unspecified: Secondary | ICD-10-CM | POA: Diagnosis not present

## 2023-04-17 DIAGNOSIS — Z0000 Encounter for general adult medical examination without abnormal findings: Secondary | ICD-10-CM | POA: Diagnosis not present

## 2023-04-17 MED ORDER — JANUVIA 100 MG PO TABS
100.0000 mg | ORAL_TABLET | Freq: Every day | ORAL | 3 refills | Status: AC
  Filled 2023-04-17 – 2023-05-01 (×2): qty 90, 90d supply, fill #0

## 2023-04-17 MED ORDER — CHLORTHALIDONE 25 MG PO TABS
25.0000 mg | ORAL_TABLET | Freq: Every morning | ORAL | 3 refills | Status: AC
  Filled 2023-04-17: qty 90, 90d supply, fill #0

## 2023-04-17 MED ORDER — JARDIANCE 25 MG PO TABS
25.0000 mg | ORAL_TABLET | Freq: Every day | ORAL | 3 refills | Status: AC
  Filled 2023-04-17: qty 90, 90d supply, fill #0

## 2023-04-25 ENCOUNTER — Other Ambulatory Visit: Payer: Self-pay | Admitting: Internal Medicine

## 2023-04-25 DIAGNOSIS — R160 Hepatomegaly, not elsewhere classified: Secondary | ICD-10-CM

## 2023-04-29 ENCOUNTER — Other Ambulatory Visit (HOSPITAL_BASED_OUTPATIENT_CLINIC_OR_DEPARTMENT_OTHER): Payer: Self-pay

## 2023-04-30 ENCOUNTER — Other Ambulatory Visit: Payer: Medicare PPO

## 2023-05-01 ENCOUNTER — Ambulatory Visit
Admission: RE | Admit: 2023-05-01 | Discharge: 2023-05-01 | Disposition: A | Discharge status: Home / Self Care | Payer: Medicare PPO | Source: Ambulatory Visit | Attending: Internal Medicine | Admitting: Internal Medicine

## 2023-05-01 DIAGNOSIS — R160 Hepatomegaly, not elsewhere classified: Secondary | ICD-10-CM | POA: Diagnosis not present

## 2023-05-01 DIAGNOSIS — K760 Fatty (change of) liver, not elsewhere classified: Secondary | ICD-10-CM | POA: Diagnosis not present

## 2023-05-02 ENCOUNTER — Other Ambulatory Visit: Payer: Self-pay

## 2023-05-02 ENCOUNTER — Other Ambulatory Visit (HOSPITAL_BASED_OUTPATIENT_CLINIC_OR_DEPARTMENT_OTHER): Payer: Self-pay

## 2023-05-07 DIAGNOSIS — B351 Tinea unguium: Secondary | ICD-10-CM | POA: Diagnosis not present

## 2023-05-07 DIAGNOSIS — M2141 Flat foot [pes planus] (acquired), right foot: Secondary | ICD-10-CM | POA: Diagnosis not present

## 2023-05-07 DIAGNOSIS — M2142 Flat foot [pes planus] (acquired), left foot: Secondary | ICD-10-CM | POA: Diagnosis not present

## 2023-05-07 DIAGNOSIS — M2042 Other hammer toe(s) (acquired), left foot: Secondary | ICD-10-CM | POA: Diagnosis not present

## 2023-05-07 DIAGNOSIS — L603 Nail dystrophy: Secondary | ICD-10-CM | POA: Diagnosis not present

## 2023-05-07 DIAGNOSIS — M2041 Other hammer toe(s) (acquired), right foot: Secondary | ICD-10-CM | POA: Diagnosis not present

## 2023-05-07 DIAGNOSIS — E1151 Type 2 diabetes mellitus with diabetic peripheral angiopathy without gangrene: Secondary | ICD-10-CM | POA: Diagnosis not present

## 2023-05-07 DIAGNOSIS — Q666 Other congenital valgus deformities of feet: Secondary | ICD-10-CM | POA: Diagnosis not present

## 2023-05-07 DIAGNOSIS — I70203 Unspecified atherosclerosis of native arteries of extremities, bilateral legs: Secondary | ICD-10-CM | POA: Diagnosis not present

## 2023-05-15 ENCOUNTER — Other Ambulatory Visit (HOSPITAL_BASED_OUTPATIENT_CLINIC_OR_DEPARTMENT_OTHER): Payer: Self-pay

## 2023-05-15 DIAGNOSIS — E1151 Type 2 diabetes mellitus with diabetic peripheral angiopathy without gangrene: Secondary | ICD-10-CM | POA: Diagnosis not present

## 2023-05-15 DIAGNOSIS — I10 Essential (primary) hypertension: Secondary | ICD-10-CM | POA: Diagnosis not present

## 2023-05-15 DIAGNOSIS — G4733 Obstructive sleep apnea (adult) (pediatric): Secondary | ICD-10-CM | POA: Diagnosis not present

## 2023-05-15 DIAGNOSIS — E876 Hypokalemia: Secondary | ICD-10-CM | POA: Diagnosis not present

## 2023-05-15 MED ORDER — JARDIANCE 25 MG PO TABS
25.0000 mg | ORAL_TABLET | Freq: Every day | ORAL | 0 refills | Status: AC
  Filled 2023-05-15: qty 90, 90d supply, fill #0

## 2023-05-15 MED ORDER — POTASSIUM CHLORIDE CRYS ER 20 MEQ PO TBCR: 20.0000 meq | EXTENDED_RELEASE_TABLET | Freq: Every day | ORAL | 0 refills | Status: AC

## 2023-05-20 DIAGNOSIS — L603 Nail dystrophy: Secondary | ICD-10-CM | POA: Diagnosis not present

## 2023-05-27 ENCOUNTER — Other Ambulatory Visit (HOSPITAL_BASED_OUTPATIENT_CLINIC_OR_DEPARTMENT_OTHER): Payer: Self-pay

## 2023-05-31 ENCOUNTER — Other Ambulatory Visit (HOSPITAL_BASED_OUTPATIENT_CLINIC_OR_DEPARTMENT_OTHER): Payer: Self-pay

## 2023-05-31 MED ORDER — INFLUENZA VAC A&B SURF ANT ADJ 0.5 ML IM SUSY
0.5000 mL | PREFILLED_SYRINGE | Freq: Once | INTRAMUSCULAR | 0 refills | Status: AC
  Filled 2023-05-31: qty 0.5, 1d supply, fill #0

## 2023-05-31 MED ORDER — COVID-19 MRNA VAC-TRIS(PFIZER) 30 MCG/0.3ML IM SUSY
0.3000 mL | PREFILLED_SYRINGE | Freq: Once | INTRAMUSCULAR | 0 refills | Status: AC
  Filled 2023-05-31: qty 0.3, 1d supply, fill #0

## 2023-06-06 DIAGNOSIS — E119 Type 2 diabetes mellitus without complications: Secondary | ICD-10-CM | POA: Diagnosis not present

## 2023-06-06 DIAGNOSIS — Z961 Presence of intraocular lens: Secondary | ICD-10-CM | POA: Diagnosis not present

## 2023-06-06 DIAGNOSIS — H524 Presbyopia: Secondary | ICD-10-CM | POA: Diagnosis not present

## 2023-06-11 ENCOUNTER — Other Ambulatory Visit (HOSPITAL_BASED_OUTPATIENT_CLINIC_OR_DEPARTMENT_OTHER): Payer: Self-pay

## 2023-06-11 MED ORDER — POTASSIUM CHLORIDE CRYS ER 20 MEQ PO TBCR
20.0000 meq | EXTENDED_RELEASE_TABLET | Freq: Every day | ORAL | 0 refills | Status: AC
  Filled 2023-06-11: qty 90, 90d supply, fill #0

## 2023-06-11 MED ORDER — INDOMETHACIN 50 MG PO CAPS
50.0000 mg | ORAL_CAPSULE | Freq: Three times a day (TID) | ORAL | 0 refills | Status: DC
  Filled 2023-06-11: qty 60, 20d supply, fill #0

## 2023-06-14 ENCOUNTER — Other Ambulatory Visit: Payer: Self-pay

## 2023-07-15 ENCOUNTER — Other Ambulatory Visit (HOSPITAL_BASED_OUTPATIENT_CLINIC_OR_DEPARTMENT_OTHER): Payer: Self-pay

## 2023-07-15 DIAGNOSIS — G4733 Obstructive sleep apnea (adult) (pediatric): Secondary | ICD-10-CM | POA: Diagnosis not present

## 2023-07-15 DIAGNOSIS — I10 Essential (primary) hypertension: Secondary | ICD-10-CM | POA: Diagnosis not present

## 2023-07-15 DIAGNOSIS — E1142 Type 2 diabetes mellitus with diabetic polyneuropathy: Secondary | ICD-10-CM | POA: Diagnosis not present

## 2023-07-15 MED ORDER — JARDIANCE 25 MG PO TABS
25.0000 mg | ORAL_TABLET | Freq: Every day | ORAL | 3 refills | Status: AC
  Filled 2023-07-15: qty 90, 90d supply, fill #0

## 2023-07-25 ENCOUNTER — Other Ambulatory Visit (HOSPITAL_BASED_OUTPATIENT_CLINIC_OR_DEPARTMENT_OTHER): Payer: Self-pay

## 2023-08-10 ENCOUNTER — Other Ambulatory Visit (HOSPITAL_BASED_OUTPATIENT_CLINIC_OR_DEPARTMENT_OTHER): Payer: Self-pay

## 2023-08-12 ENCOUNTER — Other Ambulatory Visit: Payer: Self-pay

## 2023-08-12 ENCOUNTER — Other Ambulatory Visit (HOSPITAL_BASED_OUTPATIENT_CLINIC_OR_DEPARTMENT_OTHER): Payer: Self-pay

## 2023-08-12 MED ORDER — INDOMETHACIN 50 MG PO CAPS
50.0000 mg | ORAL_CAPSULE | Freq: Three times a day (TID) | ORAL | 1 refills | Status: DC | PRN
  Filled 2023-08-12: qty 60, 20d supply, fill #0
  Filled 2023-10-07: qty 60, 20d supply, fill #1

## 2023-08-14 ENCOUNTER — Other Ambulatory Visit (HOSPITAL_BASED_OUTPATIENT_CLINIC_OR_DEPARTMENT_OTHER): Payer: Self-pay

## 2023-08-16 ENCOUNTER — Other Ambulatory Visit (HOSPITAL_COMMUNITY): Payer: Self-pay

## 2023-08-20 DIAGNOSIS — M2141 Flat foot [pes planus] (acquired), right foot: Secondary | ICD-10-CM | POA: Diagnosis not present

## 2023-08-20 DIAGNOSIS — E1151 Type 2 diabetes mellitus with diabetic peripheral angiopathy without gangrene: Secondary | ICD-10-CM | POA: Diagnosis not present

## 2023-08-20 DIAGNOSIS — L602 Onychogryphosis: Secondary | ICD-10-CM | POA: Diagnosis not present

## 2023-08-20 DIAGNOSIS — I70203 Unspecified atherosclerosis of native arteries of extremities, bilateral legs: Secondary | ICD-10-CM | POA: Diagnosis not present

## 2023-08-20 DIAGNOSIS — M2041 Other hammer toe(s) (acquired), right foot: Secondary | ICD-10-CM | POA: Diagnosis not present

## 2023-08-20 DIAGNOSIS — M2042 Other hammer toe(s) (acquired), left foot: Secondary | ICD-10-CM | POA: Diagnosis not present

## 2023-08-20 DIAGNOSIS — Q666 Other congenital valgus deformities of feet: Secondary | ICD-10-CM | POA: Diagnosis not present

## 2023-08-20 DIAGNOSIS — M2142 Flat foot [pes planus] (acquired), left foot: Secondary | ICD-10-CM | POA: Diagnosis not present

## 2023-10-07 ENCOUNTER — Other Ambulatory Visit (HOSPITAL_BASED_OUTPATIENT_CLINIC_OR_DEPARTMENT_OTHER): Payer: Self-pay

## 2023-10-07 MED ORDER — METFORMIN HCL ER 500 MG PO TB24
ORAL_TABLET | ORAL | 0 refills | Status: DC
  Filled 2023-10-07: qty 270, 90d supply, fill #0

## 2023-10-08 ENCOUNTER — Other Ambulatory Visit (HOSPITAL_BASED_OUTPATIENT_CLINIC_OR_DEPARTMENT_OTHER): Payer: Self-pay

## 2023-10-21 ENCOUNTER — Other Ambulatory Visit (HOSPITAL_BASED_OUTPATIENT_CLINIC_OR_DEPARTMENT_OTHER): Payer: Self-pay

## 2023-10-25 DIAGNOSIS — E1169 Type 2 diabetes mellitus with other specified complication: Secondary | ICD-10-CM | POA: Diagnosis not present

## 2023-10-25 DIAGNOSIS — I10 Essential (primary) hypertension: Secondary | ICD-10-CM | POA: Diagnosis not present

## 2023-10-25 DIAGNOSIS — M7581 Other shoulder lesions, right shoulder: Secondary | ICD-10-CM | POA: Diagnosis not present

## 2023-10-25 DIAGNOSIS — C20 Malignant neoplasm of rectum: Secondary | ICD-10-CM | POA: Diagnosis not present

## 2023-10-25 DIAGNOSIS — E785 Hyperlipidemia, unspecified: Secondary | ICD-10-CM | POA: Diagnosis not present

## 2023-10-25 DIAGNOSIS — I70203 Unspecified atherosclerosis of native arteries of extremities, bilateral legs: Secondary | ICD-10-CM | POA: Diagnosis not present

## 2023-10-25 DIAGNOSIS — I480 Paroxysmal atrial fibrillation: Secondary | ICD-10-CM | POA: Diagnosis not present

## 2023-10-25 DIAGNOSIS — I700 Atherosclerosis of aorta: Secondary | ICD-10-CM | POA: Diagnosis not present

## 2023-11-11 ENCOUNTER — Other Ambulatory Visit: Payer: Self-pay

## 2023-11-11 ENCOUNTER — Other Ambulatory Visit (HOSPITAL_BASED_OUTPATIENT_CLINIC_OR_DEPARTMENT_OTHER): Payer: Self-pay

## 2023-11-11 MED ORDER — ROSUVASTATIN CALCIUM 10 MG PO TABS
10.0000 mg | ORAL_TABLET | Freq: Every day | ORAL | 1 refills | Status: DC
  Filled 2023-11-11: qty 90, 90d supply, fill #0
  Filled 2024-03-18: qty 90, 90d supply, fill #1

## 2023-11-11 MED ORDER — AMLODIPINE-OLMESARTAN 10-40 MG PO TABS
1.0000 | ORAL_TABLET | Freq: Every day | ORAL | 1 refills | Status: DC
  Filled 2023-11-11: qty 90, 90d supply, fill #0
  Filled 2024-02-13: qty 90, 90d supply, fill #1

## 2023-11-12 ENCOUNTER — Other Ambulatory Visit: Payer: Self-pay

## 2023-11-12 ENCOUNTER — Other Ambulatory Visit (HOSPITAL_BASED_OUTPATIENT_CLINIC_OR_DEPARTMENT_OTHER): Payer: Self-pay

## 2023-11-18 ENCOUNTER — Encounter (HOSPITAL_COMMUNITY): Payer: Self-pay | Admitting: *Deleted

## 2023-11-18 ENCOUNTER — Ambulatory Visit: Payer: Self-pay | Admitting: Internal Medicine

## 2023-11-18 ENCOUNTER — Other Ambulatory Visit (HOSPITAL_COMMUNITY): Payer: Self-pay

## 2023-11-18 ENCOUNTER — Ambulatory Visit (INDEPENDENT_AMBULATORY_CARE_PROVIDER_SITE_OTHER)

## 2023-11-18 ENCOUNTER — Ambulatory Visit (HOSPITAL_COMMUNITY)
Admission: EM | Admit: 2023-11-18 | Discharge: 2023-11-18 | Disposition: A | Discharge status: Home / Self Care | Attending: Internal Medicine | Admitting: Internal Medicine

## 2023-11-18 DIAGNOSIS — J09X2 Influenza due to identified novel influenza A virus with other respiratory manifestations: Secondary | ICD-10-CM | POA: Diagnosis not present

## 2023-11-18 DIAGNOSIS — R509 Fever, unspecified: Secondary | ICD-10-CM | POA: Diagnosis not present

## 2023-11-18 DIAGNOSIS — R051 Acute cough: Secondary | ICD-10-CM

## 2023-11-18 DIAGNOSIS — R059 Cough, unspecified: Secondary | ICD-10-CM | POA: Diagnosis not present

## 2023-11-18 LAB — POC COVID19/FLU A&B COMBO
Covid Antigen, POC: NEGATIVE
Influenza A Antigen, POC: POSITIVE — AB
Influenza B Antigen, POC: NEGATIVE

## 2023-11-18 MED ORDER — ACETAMINOPHEN 325 MG PO TABS
ORAL_TABLET | ORAL | Status: AC
  Filled 2023-11-18: qty 2

## 2023-11-18 MED ORDER — ALBUTEROL SULFATE (2.5 MG/3ML) 0.083% IN NEBU
INHALATION_SOLUTION | RESPIRATORY_TRACT | Status: AC
  Filled 2023-11-18: qty 3

## 2023-11-18 MED ORDER — ACETAMINOPHEN 325 MG PO TABS
650.0000 mg | ORAL_TABLET | Freq: Once | ORAL | Status: AC
  Administered 2023-11-18: 650 mg via ORAL

## 2023-11-18 MED ORDER — ALBUTEROL SULFATE HFA 108 (90 BASE) MCG/ACT IN AERS
2.0000 | INHALATION_SPRAY | RESPIRATORY_TRACT | 0 refills | Status: AC | PRN
Start: 2023-11-18 — End: ?
  Filled 2023-11-18: qty 6.7, 17d supply, fill #0

## 2023-11-18 MED ORDER — ALBUTEROL SULFATE (2.5 MG/3ML) 0.083% IN NEBU
2.5000 mg | INHALATION_SOLUTION | Freq: Once | RESPIRATORY_TRACT | Status: AC
  Administered 2023-11-18: 2.5 mg via RESPIRATORY_TRACT

## 2023-11-18 MED ORDER — OSELTAMIVIR PHOSPHATE 75 MG PO CAPS
75.0000 mg | ORAL_CAPSULE | Freq: Two times a day (BID) | ORAL | 0 refills | Status: AC
  Filled 2023-11-18: qty 10, 5d supply, fill #0

## 2023-11-18 NOTE — Telephone Encounter (Signed)
 Chief Complaint: Congestion, weakness, lethargy Symptoms: see above Frequency: since Wednesday Pertinent Negatives: Patient denies fever Disposition: [] ED /[] Urgent Care (no appt availability in office) / [x] Appointment(In office/virtual)/ []  [facility]/ [] Home Care/ [] Refused Recommended Disposition /[] [place] [facility]/ []  Follow-up with PCP Additional Notes: Patient's wife called in stating she would like patient seen today before going out of town for sinus congestion, cough with green phlegm, weakness and lethargy. Patient has been trying OTC medications with no improvement. Advised patient's wife on availability of her requested area. Assisted wife with information on UC wait time. Wife states she will be taking patient to UC now.    Copied from CRM [id number]. Topic: Clinical - Red Word Triage >> Nov 18, 2023 11:14 AM Shon Hale wrote: Red Word that prompted transfer to Nurse Triage: Congested since last Wednesday, and very tired. Very weak. Reason for Disposition  Lots of coughing  Answer Assessment - Initial Assessment Questions 1. LOCATION: "Where does it hurt?"      Severe congestion 2. ONSET: "When did the sinus pain start?"  (e.g., hours, days)      Last Wednesday 3. SEVERITY: "How bad is the pain?"   (Scale 1-10; mild, moderate or severe)   - MILD (1-3): doesn't interfere with normal activities    - MODERATE (4-7): interferes with normal activities (e.g., work or school) or awakens from sleep   - SEVERE (8-10): excruciating pain and patient unable to do any normal activities        No sinus pain 4. RECURRENT SYMPTOM: "Have you ever had sinus problems before?" If Yes, ask: "When was the last time?" and "What happened that time?"      N/a 5. NASAL CONGESTION: "Is the nose blocked?" If Yes, ask: "Can you open it or must you breathe through your mouth?"     Yes 6. NASAL DISCHARGE: "Do you have discharge from your nose?" If so ask, "What color?"     N/a 7. FEVER:  "Do you have a fever?" If Yes, ask: "What is it, how was it measured, and when did it start?"      No 8. OTHER SYMPTOMS: "Do you have any other symptoms?" (e.g., sore throat, cough, earache, difficulty breathing)     Cough with green phlegm, weakness, lethargy  Protocols used: Sinus Pain or Congestion-A-AH

## 2023-11-18 NOTE — ED Provider Notes (Signed)
 [facility]    CSN: [id number] Arrival date & time: 11/18/23  1149      History   Chief Complaint Chief Complaint  Patient presents with   Cough   Nasal Congestion    HPI [name] [name] is a 73 y.o. male who developed cold symptoms 5 days ago and thought he was getting better. Had negative covid test yesterday. This am he woke up worse, with chest congestion, aches, achy and has not had a fever til he found out while here. Taking deep breaths provokes him to cough. His cough had been productive with green mucous, but when he got better changed to clear color. Has been taking Mucinex, vitamins and Zinc.     Past Medical History:  Diagnosis Date   Allergy    Bilateral renal cysts    benign/ Patient denies   COPD (chronic obstructive pulmonary disease) (HCC)     PFT 2010 due to previous 20y h/o smoking   DDD (degenerative disc disease), lumbar    Diabetes mellitus type II    ED (erectile dysfunction) of organic origin    First degree heart block    History of colon polyps    History of prostate cancer current PSA per pt 0.13   dx March 2014---  s/p radiactive prostate seed implants and intraop radiotherapy in [place], [place]   Hyperlipidemia    Hypertension    Liver mass 06/2013   OVER HALF OF LIVER REMOVED/ BENIGN   OSA on CPAP    Osteoarthritis    PAF (paroxysmal atrial fibrillation) [facility])    cardiologist--  dr hochrein  (episode prior to liver surgery at [facility] 2014 w/ post-op RVR)   Prostate cancer (HCC) 11-11-2012   s/p chemo, radiation    Rectal cancer (HCC)    01-25-2015----per pathology --  complete rectal polypectomy-- showed  invasive carcinoma rising in tubular adnemoa high grade hyperplasia--  per dr Christella Hartigan per MRI  no other area seen -- will have colonoscopy done in 3 months   Sleep apnea    wears cpap   Ulcer    STOMACH ULCER   Wears glasses     Patient Active Problem List   Diagnosis Date Noted   Intermittent diarrhea 09/09/2020    TIA (transient ischemic attack) 12/08/2019   AC joint pain 09/22/2019   Biceps tendinitis 09/25/2018   Melena 06/22/2016   Fatigue 08/10/2015   Erectile dysfunction 04/25/2015   Anemia due to chronic blood loss 12/21/2014   GI bleed 12/21/2014   Chest pain at rest 09/30/2014   Edema 07/13/2014   Allergic rhinitis 03/17/2014   PAF (paroxysmal atrial fibrillation) (HCC) 05/25/2013   Cardiomegaly 12/12/2012   Liver mass 12/12/2012   Prostate cancer (HCC) 12/11/2012   Hepatic cyst 12/11/2012   Hematuria, gross 09/05/2012   Knee osteoarthritis 09/05/2012   OSA on CPAP 09/05/2012   Snoring 09/07/2011   Paresthesia of left arm 07/06/2011   Gout attack 03/13/2011   Well adult exam 03/03/2011   Arthritis of knee 03/03/2011   Psychosis (HCC) 03/09/2010   GAIT DISTURBANCE 03/09/2010   HEAT STROKE 03/09/2010   FOOT PAIN 11/08/2009   CHEST PAIN UNSPECIFIED 08/19/2009   COPD (chronic obstructive pulmonary disease) (HCC) 07/08/2009   TOBACCO USE, QUIT 07/08/2009   RECTAL BLEEDING 12/07/2008   History of colonic polyps 12/07/2008   Dyslipidemia 03/02/2008   HEMATOCHEZIA 03/02/2008   COUGH 03/02/2008   HYPOKALEMIA 02/03/2008   Acute sinusitis 02/03/2008   SYNCOPE 02/03/2008  DM (diabetes mellitus), type 2 with complications (HCC) 11/27/2007   Shoulder pain 11/27/2007   ERECTILE DYSFUNCTION 10/30/2007   Essential hypertension 10/30/2007   Osteoarthritis 10/30/2007   Bilateral low back pain with bilateral sciatica 10/30/2007    Past Surgical History:  Procedure Laterality Date    RIGHT LOBE HEPATECTOMY  06-12-2013    [facility]   benign mass   CARDIOVASCULAR STRESS TEST  10-01-2014   dr hochrein   normal perfusion study, no ischemia,  normal LV function and wall motion, ef 70%   COLONOSCOPY  2019   ESOPHAGOGASTRODUODENOSCOPY N/A 06/23/2016   Procedure: ESOPHAGOGASTRODUODENOSCOPY (EGD);  Surgeon: Ruffin Frederick, MD;  Location: [facility] ENDOSCOPY;  Service: Gastroenterology;   Laterality: N/A;   EUS N/A 02/10/2015   Procedure: LOWER ENDOSCOPIC ULTRASOUND (EUS);  Surgeon: Rachael Fee, MD;  Location: [facility] ENDOSCOPY;  Service: Endoscopy;  Laterality: N/A;   KNEE ARTHROSCOPY Bilateral 1990's   LUMBAR FUSION  2001   PENILE PROSTHESIS IMPLANT N/A 04/25/2015   Procedure: PENILE PROSTHESIS THREE PIECE INFLATABLE( COLOPLAST) SCROTAL APPROACH;  Surgeon: Ihor Gully, MD;  Location: [facility];  Service: Urology;  Laterality: N/A;   POLYPECTOMY     RADIOACTIVE PROSTATE SEED IMPLANTS  May 2014  in [place], [place]   REPLACEMENT TOTAL KNEE Right 02/21/2016   TOTAL KNEE ARTHROPLASTY Left 06/2012   TOTAL KNEE ARTHROPLASTY Right 2017   TRANSTHORACIC ECHOCARDIOGRAM  12/26/2012   mild focal basal septal hypertrophy,  grade II diastolic dysfunction,  ef 55-60%,  mild LAE,  trivial MR and TR   UPPER GASTROINTESTINAL ENDOSCOPY     VASECTOMY  1980's w/ gen. anes.    Home Medications    Prior to Admission medications   Medication Sig Start Date End Date Taking? Authorizing Provider  albuterol (VENTOLIN HFA) 108 (90 Base) MCG/ACT inhaler Inhale 2 puffs into the lungs every 4 (four) hours as needed for wheezing or shortness of breath. 11/18/23  Yes Rodriguez-Southworth, Nettie Elm, PA-C  amLODipine-olmesartan (AZOR) 10-40 MG tablet Take 1 tablet by mouth daily. 11/11/23  Yes   ASPIRIN LOW DOSE 81 MG chewable tablet Chew by mouth. 04/26/21  Yes [provider]  empagliflozin (JARDIANCE) 25 MG TABS tablet Take 1 tablet (25 mg total) by mouth daily. 07/15/23  Yes   indomethacin (INDOCIN) 50 MG capsule Take 1 capsule (50 mg total) by mouth 3 (three) times daily as needed for gout. Take with food or milk. 08/12/23  Yes   metFORMIN (GLUCOPHAGE-XR) 500 MG 24 hr tablet Take 2 tablets (1,000 mg total) by mouth daily with breakfast AND 1 tablet (500 mg total) every evening with dinner 10/07/23  Yes   Multiple Vitamin (MULTIVITAMIN) tablet Take 1 tablet by mouth daily.   Yes [provider]  oseltamivir (TAMIFLU) 75 MG capsule Take 1 capsule (75 mg total) by mouth every 12 (twelve) hours. 11/18/23  Yes Rodriguez-Southworth, Nettie Elm, PA-C  pioglitazone (ACTOS) 15 MG tablet Take 1 tablet (15 mg total) by mouth daily. 03/19/23  Yes   pravastatin (PRAVACHOL) 40 MG tablet Take 1 tablet (40 mg total) by mouth daily. 12/12/21  Yes Plotnikov, Georgina Quint, MD  rosuvastatin (CRESTOR) 10 MG tablet Take 1 tablet (10 mg total) by mouth daily. 11/11/23  Yes   acetaminophen-codeine (TYLENOL #3) 300-30 MG tablet Take 1 tablet by mouth every 6 (six) hours as needed for severe pain. Take by mouth every 4 (four) hours as needed for moderate pain. 09/20/22   Plotnikov, Georgina Quint, MD  amLODipine (NORVASC)  10 MG tablet Take 1 tablet (10 mg total) by mouth daily. 03/19/23     amLODipine-olmesartan (AZOR) 10-40 MG tablet Take 1 tablet by mouth daily. Annual appt is due must see provider for future refills 02/04/23   Plotnikov, Georgina Quint, MD  blood glucose meter kit and supplies KIT Dispense based on patient and insurance preference. Use up to four times daily as directed. (FOR ICD-9 250.00, 250.01). 08/23/17   Plotnikov, Georgina Quint, MD  Blood Glucose Monitoring Suppl (ACCU-CHEK GUIDE) w/Device KIT Use to check blood sugars three times a day. 03/15/23     chlorthalidone (HYGROTON) 25 MG tablet Take 1 tablet (25 mg total) by mouth in the morning with food. Stop taking hydrochlorothiazide. 04/17/23     COVID-19 mRNA vaccine 2023-2024 (COMIRNATY) SUSP injection Inject into the muscle. 06/15/22   Judyann Munson, MD  diclofenac Sodium (VOLTAREN) 1 % GEL APPLY TOPICALLY TO THE AFFECTED AREA FOUR TIMES DAILY 10/23/21   Plotnikov, Georgina Quint, MD  empagliflozin (JARDIANCE) 10 MG TABS tablet Take 1 tablet (10 mg total) by mouth daily. 03/06/23     empagliflozin (JARDIANCE) 10 MG TABS tablet Take 1 tablet (10 mg total) by mouth daily. 03/19/23     empagliflozin (JARDIANCE) 10 MG TABS tablet Take 1 tablet (10 mg total) by  mouth daily as directed 04/15/23     empagliflozin (JARDIANCE) 25 MG TABS tablet Take 1 tablet (25 mg total) by mouth daily. 04/17/23     empagliflozin (JARDIANCE) 25 MG TABS tablet Take 1 tablet (25 mg total) by mouth daily. 05/15/23     EPINEPHrine 0.3 mg/0.3 mL IJ SOAJ injection SMARTSIG:1 pre-filled pen syringe IM Once 04/04/22   [provider]  fluticasone (FLONASE) 50 MCG/ACT nasal spray USE ONE SPRAY IN EACH NOSTRIL DAILY 09/22/19   Plotnikov, Georgina Quint, MD  furosemide (LASIX) 20 MG tablet Take 1-2 tablets (20-40 mg total) by mouth daily as needed for edema. 09/22/19   Plotnikov, Georgina Quint, MD  furosemide (LASIX) 20 MG tablet Take 1 tablet (20 mg total) by mouth daily as needed for lower extremity edema. 03/19/23     Glucose Blood (BLOOD GLUCOSE TEST STRIPS) STRP Use to check blood sugar 3 times daily. Morning, afternoon and before he goes to bed 02/13/23     Glucose Blood (BLOOD GLUCOSE TEST STRIPS) STRP Use to check blood sugar 3 (three) times a day in the morning, afternoon, and at bedtime. 03/19/23     hydrochlorothiazide (HYDRODIURIL) 25 MG tablet Take 1 tablet (25 mg total) by mouth every morning. 03/18/23     hydrochlorothiazide (HYDRODIURIL) 25 MG tablet Take 1 tablet (25 mg total) by mouth every morning. 03/19/23     indomethacin (INDOCIN) 50 MG capsule Take 1 capsule (50 mg total) by mouth 3 (three) times daily as needed for moderate pain or gout 07/31/22   Plotnikov, Georgina Quint, MD  indomethacin (INDOCIN) 50 MG capsule Take 1 capsule (50 mg total) by mouth 3 (three) times daily as needed for gout 04/15/23     influenza vaccine adjuvanted (FLUAD) 0.5 ML injection Inject into the muscle. 06/26/22   Judyann Munson, MD  metFORMIN (GLUCOPHAGE-XR) 500 MG 24 hr tablet Take 1 tablet by mouth daily with a meal for 2 weeks and increase to 2 tablets daily with a meal as tolerated 11/15/22     metFORMIN (GLUCOPHAGE-XR) 500 MG 24 hr tablet Take 2 tablets (1,000 mg total) by mouth with breakfast and 1  tablet (500 mg total) by mouth with dinner.  03/19/23     metoprolol tartrate (LOPRESSOR) 50 MG tablet Take 1 tablet (50 mg total) by mouth 2 (two) times daily. 03/18/23     metoprolol tartrate (LOPRESSOR) 50 MG tablet Take 1 tablet (50 mg total) by mouth 2 (two) times daily with food. 03/19/23     Omega-3 Fatty Acids (FISH OIL PO) Take 2 tablets by mouth daily.    [provider]  pantoprazole (PROTONIX) 40 MG tablet Take 1 tablet (40 mg total) by mouth 2 (two) times daily. 03/18/23     pantoprazole (PROTONIX) 40 MG tablet Take 1 tablet (40 mg total) by mouth 2 (two) times daily. 03/19/23     pantoprazole (PROTONIX) 40 MG tablet Take 1 tablet (40 mg total) by mouth 2 (two) times daily. 04/15/23     pioglitazone (ACTOS) 30 MG tablet Take 1 tablet (30 mg total) by mouth daily. 08/22/22 12/20/22  Theadora Rama Scales, PA-C  potassium chloride SA (KLOR-CON M) 20 MEQ tablet TAKE 1 TABLET(20 MEQ) BY MOUTH DAILY 12/12/21   Plotnikov, Georgina Quint, MD  potassium chloride SA (KLOR-CON M) 20 MEQ tablet Take 1 tablet (20 mEq total) by mouth daily. 02/04/23   Plotnikov, Georgina Quint, MD  potassium chloride SA (KLOR-CON M) 20 MEQ tablet Take 1 tablet (20 mEq total) by mouth daily with food. 03/19/23     potassium chloride SA (KLOR-CON M) 20 MEQ tablet Take 1 tablet (20 mEq total) by mouth daily. 05/15/23     potassium chloride SA (KLOR-CON M) 20 MEQ tablet Take 1 tablet (20 mEq total) by mouth daily. 06/11/23     rosuvastatin (CRESTOR) 10 MG tablet Take 1 tablet (10 mg total) by mouth daily. 11/15/22     rosuvastatin (CRESTOR) 10 MG tablet Take 1 tablet (10 mg total) by mouth daily. 03/19/23     RSV vaccine recomb adjuvanted (AREXVY) 120 MCG/0.5ML injection Inject into the muscle. 08/10/22   Judyann Munson, MD  sitaGLIPtin (JANUVIA) 100 MG tablet Take 1 tablet (100 mg total) by mouth daily. 04/17/23     tamsulosin (FLOMAX) 0.4 MG CAPS capsule Take 1 capsule (0.4 mg total) by mouth every morning. 12/12/21   Plotnikov,  Georgina Quint, MD  tamsulosin (FLOMAX) 0.4 MG CAPS capsule Take 1 capsule (0.4 mg total) by mouth daily. 09/25/22     tamsulosin (FLOMAX) 0.4 MG CAPS capsule Take 1 capsule (0.4 mg total) by mouth daily. 03/19/23       Family History Family History  Problem Relation Age of Onset   Diabetes Mother    Heart disease Mother        Pacemaker   Pancreatic cancer Father    Stomach cancer Father    Stomach cancer Sister    Pancreatic cancer Sister    Hypertension Other    Colon polyps Neg Hx    Rectal cancer Neg Hx    Esophageal cancer Neg Hx    Colon cancer Neg Hx     Social History Social History   Tobacco Use   Smoking status: Former    Current packs/day: 0.00    Average packs/day: 2.0 packs/day for 15.0 years (30.0 ttl pk-yrs)    Types: Cigarettes    Start date: 09/03/1984    Quit date: 09/04/1999    Years since quitting: 24.2   Smokeless tobacco: Never  Vaping Use   Vaping status: Never Used  Substance Use Topics   Alcohol use: Yes    Comment: Occasional   Drug use: No  Allergies   Benazepril hcl and Sildenafil   Review of Systems Review of Systems As noted in HPI  Physical Exam Triage Vital Signs ED Triage Vitals  Encounter Vitals Group     BP 11/18/23 1310 133/79     Systolic BP Percentile --      Diastolic BP Percentile --      Pulse Rate 11/18/23 1310 96     Resp 11/18/23 1310 18     Temp 11/18/23 1310 (!) 100.6 F (38.1 C)     Temp Source 11/18/23 1310 Oral     SpO2 11/18/23 1310 96 %     Weight --      Height --      Head Circumference --      Peak Flow --      Pain Score 11/18/23 1305 0     Pain Loc --      Pain Education --      Exclude from Growth Chart --    No data found.  Updated Vital Signs BP 133/79 (BP Location: Left Arm)   Pulse 96   Temp (!) 100.6 F (38.1 C) (Oral)   Resp 18   SpO2 96%   Visual Acuity Right Eye Distance:   Left Eye Distance:   Bilateral Distance:    Right Eye Near:   Left Eye Near:    Bilateral Near:      Physical Exam Physical Exam Constitutional:      General: He is not in acute distress.    Appearance: He is not toxic-appearing.  HENT:     Head: Normocephalic.     Right Ear: Tympanic membrane, ear canal and external ear normal.     Left Ear: Ear canal and external ear normal.     Nose: Nose normal.     Mouth/Throat:     Mouth: Mucous membranes are moist.     Pharynx: Oropharynx is clear.  Eyes:     General: No scleral icterus.    Conjunctiva/sclera: Conjunctivae normal.  Cardiovascular:     Rate and Rhythm: Normal rate and regular rhythm.     Heart sounds: No murmur heard.   Pulmonary:     Effort: Pulmonary effort is normal. No respiratory distress.     Breath sounds: Wheezing present.     Comments: Has auditory wheezing Musculoskeletal:        General: Normal range of motion.     Cervical back: Neck supple.  Lymphadenopathy:     Cervical: No cervical adenopathy.  Skin:    General: Skin is warm and dry.     Findings: No rash.  Neurological:     Mental Status: He is alert and oriented to person, place, and time.     Gait: Gait normal.  Psychiatric:        Mood and Affect: Mood normal.        Behavior: Behavior normal.        Thought Content: Thought content normal.        Judgment: Judgment normal.    UC Treatments / Results  Labs (all labs ordered are listed, but only abnormal results are displayed) Labs Reviewed  POC COVID19/FLU A&B COMBO - Abnormal; Notable for the following components:      Result Value   Influenza A Antigen, POC Positive (*)    All other components within normal limits  Covid and Flu B are negative Flu A is positive   EKG   Radiology No results found.  Procedures Procedures (including critical care time)  Medications Ordered in UC Medications  acetaminophen (TYLENOL) tablet 650 mg (650 mg Oral Given 11/18/23 1313)  albuterol (PROVENTIL) (2.5 MG/3ML) 0.083% nebulizer solution 2.5 mg (2.5 mg Nebulization Given 11/18/23 1408)     Initial Impression / Assessment and Plan / UC Course  I have reviewed the triage vital signs and the nursing notes. He was given an Albuterol neb treatment and the wheezing resolved.  Pertinent labs & imaging results that were available during my care of the patient were reviewed by me and considered in my medical decision making (see chart for details).  Influenza A  Placed on Tamiflu and Albuterol inhaler as noted. We will inform him if the chest xray comes back abnormal.  Final Clinical Impressions(s) / UC Diagnoses   Final diagnoses:  Acute cough  Fever, unspecified  Influenza due to identified novel influenza A virus with other respiratory manifestations     Discharge Instructions      The radiologist has not read the chest xray yet. We will notify your when the reading is done if abnormal.      ED Prescriptions     Medication Sig Dispense Auth. Provider   oseltamivir (TAMIFLU) 75 MG capsule Take 1 capsule (75 mg total) by mouth every 12 (twelve) hours. 10 capsule Rodriguez-Southworth, Nettie Elm, PA-C   albuterol (VENTOLIN HFA) 108 (90 Base) MCG/ACT inhaler Inhale 2 puffs into the lungs every 4 (four) hours as needed for wheezing or shortness of breath. 6.7 g Rodriguez-Southworth, Nettie Elm, PA-C      PDMP not reviewed this encounter.   Garey Ham, PA-C 11/18/23 1417

## 2023-11-18 NOTE — ED Triage Notes (Addendum)
 Pt states he has cough and congestion since last Wednesday. He is taking zinc, mucinex and vitamins without relief.   Pts wife states she did at home COVID test yesterday which was neg.

## 2023-11-18 NOTE — Discharge Instructions (Signed)
 The radiologist has not read the chest xray yet. We will notify your when the reading is done if abnormal.

## 2023-12-11 DIAGNOSIS — Q666 Other congenital valgus deformities of feet: Secondary | ICD-10-CM | POA: Diagnosis not present

## 2023-12-11 DIAGNOSIS — B351 Tinea unguium: Secondary | ICD-10-CM | POA: Diagnosis not present

## 2023-12-11 DIAGNOSIS — E1151 Type 2 diabetes mellitus with diabetic peripheral angiopathy without gangrene: Secondary | ICD-10-CM | POA: Diagnosis not present

## 2023-12-11 DIAGNOSIS — L602 Onychogryphosis: Secondary | ICD-10-CM | POA: Diagnosis not present

## 2023-12-11 DIAGNOSIS — M2141 Flat foot [pes planus] (acquired), right foot: Secondary | ICD-10-CM | POA: Diagnosis not present

## 2023-12-11 DIAGNOSIS — M2042 Other hammer toe(s) (acquired), left foot: Secondary | ICD-10-CM | POA: Diagnosis not present

## 2023-12-11 DIAGNOSIS — M2041 Other hammer toe(s) (acquired), right foot: Secondary | ICD-10-CM | POA: Diagnosis not present

## 2023-12-11 DIAGNOSIS — I70203 Unspecified atherosclerosis of native arteries of extremities, bilateral legs: Secondary | ICD-10-CM | POA: Diagnosis not present

## 2023-12-11 DIAGNOSIS — M2142 Flat foot [pes planus] (acquired), left foot: Secondary | ICD-10-CM | POA: Diagnosis not present

## 2023-12-16 ENCOUNTER — Other Ambulatory Visit (HOSPITAL_BASED_OUTPATIENT_CLINIC_OR_DEPARTMENT_OTHER): Payer: Self-pay

## 2023-12-16 MED ORDER — INDOMETHACIN 50 MG PO CAPS
50.0000 mg | ORAL_CAPSULE | Freq: Three times a day (TID) | ORAL | 1 refills | Status: DC | PRN
  Filled 2023-12-16: qty 60, 20d supply, fill #0
  Filled 2024-02-13: qty 60, 20d supply, fill #1

## 2023-12-16 MED ORDER — METFORMIN HCL ER 500 MG PO TB24
ORAL_TABLET | ORAL | 2 refills | Status: AC
  Filled 2024-02-14: qty 270, 90d supply, fill #0
  Filled 2024-06-05 – 2024-07-10 (×2): qty 270, 90d supply, fill #1

## 2023-12-19 ENCOUNTER — Other Ambulatory Visit (HOSPITAL_BASED_OUTPATIENT_CLINIC_OR_DEPARTMENT_OTHER): Payer: Self-pay

## 2023-12-23 ENCOUNTER — Other Ambulatory Visit (HOSPITAL_BASED_OUTPATIENT_CLINIC_OR_DEPARTMENT_OTHER): Payer: Self-pay

## 2023-12-27 ENCOUNTER — Other Ambulatory Visit (HOSPITAL_BASED_OUTPATIENT_CLINIC_OR_DEPARTMENT_OTHER): Payer: Self-pay

## 2024-01-28 ENCOUNTER — Other Ambulatory Visit (HOSPITAL_BASED_OUTPATIENT_CLINIC_OR_DEPARTMENT_OTHER): Payer: Self-pay

## 2024-02-14 ENCOUNTER — Other Ambulatory Visit (HOSPITAL_BASED_OUTPATIENT_CLINIC_OR_DEPARTMENT_OTHER): Payer: Self-pay

## 2024-03-12 DIAGNOSIS — I70203 Unspecified atherosclerosis of native arteries of extremities, bilateral legs: Secondary | ICD-10-CM | POA: Diagnosis not present

## 2024-03-12 DIAGNOSIS — M2041 Other hammer toe(s) (acquired), right foot: Secondary | ICD-10-CM | POA: Diagnosis not present

## 2024-03-12 DIAGNOSIS — B351 Tinea unguium: Secondary | ICD-10-CM | POA: Diagnosis not present

## 2024-03-12 DIAGNOSIS — L84 Corns and callosities: Secondary | ICD-10-CM | POA: Diagnosis not present

## 2024-03-12 DIAGNOSIS — E1151 Type 2 diabetes mellitus with diabetic peripheral angiopathy without gangrene: Secondary | ICD-10-CM | POA: Diagnosis not present

## 2024-03-12 DIAGNOSIS — M2042 Other hammer toe(s) (acquired), left foot: Secondary | ICD-10-CM | POA: Diagnosis not present

## 2024-03-12 DIAGNOSIS — M2141 Flat foot [pes planus] (acquired), right foot: Secondary | ICD-10-CM | POA: Diagnosis not present

## 2024-03-12 DIAGNOSIS — L602 Onychogryphosis: Secondary | ICD-10-CM | POA: Diagnosis not present

## 2024-03-12 DIAGNOSIS — M2142 Flat foot [pes planus] (acquired), left foot: Secondary | ICD-10-CM | POA: Diagnosis not present

## 2024-04-17 DIAGNOSIS — I10 Essential (primary) hypertension: Secondary | ICD-10-CM | POA: Diagnosis not present

## 2024-04-17 DIAGNOSIS — E785 Hyperlipidemia, unspecified: Secondary | ICD-10-CM | POA: Diagnosis not present

## 2024-04-17 DIAGNOSIS — M109 Gout, unspecified: Secondary | ICD-10-CM | POA: Diagnosis not present

## 2024-04-17 DIAGNOSIS — Z125 Encounter for screening for malignant neoplasm of prostate: Secondary | ICD-10-CM | POA: Diagnosis not present

## 2024-04-17 DIAGNOSIS — R82998 Other abnormal findings in urine: Secondary | ICD-10-CM | POA: Diagnosis not present

## 2024-04-17 DIAGNOSIS — E7849 Other hyperlipidemia: Secondary | ICD-10-CM | POA: Diagnosis not present

## 2024-04-17 DIAGNOSIS — E1169 Type 2 diabetes mellitus with other specified complication: Secondary | ICD-10-CM | POA: Diagnosis not present

## 2024-04-22 ENCOUNTER — Other Ambulatory Visit (HOSPITAL_COMMUNITY): Payer: Self-pay

## 2024-04-22 ENCOUNTER — Other Ambulatory Visit: Payer: Self-pay

## 2024-04-22 ENCOUNTER — Other Ambulatory Visit (HOSPITAL_BASED_OUTPATIENT_CLINIC_OR_DEPARTMENT_OTHER): Payer: Self-pay

## 2024-04-22 DIAGNOSIS — Z8546 Personal history of malignant neoplasm of prostate: Secondary | ICD-10-CM | POA: Diagnosis not present

## 2024-04-22 DIAGNOSIS — E785 Hyperlipidemia, unspecified: Secondary | ICD-10-CM | POA: Diagnosis not present

## 2024-04-22 DIAGNOSIS — Z0000 Encounter for general adult medical examination without abnormal findings: Secondary | ICD-10-CM | POA: Diagnosis not present

## 2024-04-22 DIAGNOSIS — E1169 Type 2 diabetes mellitus with other specified complication: Secondary | ICD-10-CM | POA: Diagnosis not present

## 2024-04-22 DIAGNOSIS — Z1331 Encounter for screening for depression: Secondary | ICD-10-CM | POA: Diagnosis not present

## 2024-04-22 DIAGNOSIS — J449 Chronic obstructive pulmonary disease, unspecified: Secondary | ICD-10-CM | POA: Diagnosis not present

## 2024-04-22 DIAGNOSIS — N139 Obstructive and reflux uropathy, unspecified: Secondary | ICD-10-CM | POA: Diagnosis not present

## 2024-04-22 DIAGNOSIS — M109 Gout, unspecified: Secondary | ICD-10-CM | POA: Diagnosis not present

## 2024-04-22 DIAGNOSIS — I10 Essential (primary) hypertension: Secondary | ICD-10-CM | POA: Diagnosis not present

## 2024-04-22 DIAGNOSIS — E876 Hypokalemia: Secondary | ICD-10-CM | POA: Diagnosis not present

## 2024-04-22 DIAGNOSIS — Z23 Encounter for immunization: Secondary | ICD-10-CM | POA: Diagnosis not present

## 2024-04-22 MED ORDER — INDOMETHACIN 50 MG PO CAPS
50.0000 mg | ORAL_CAPSULE | Freq: Two times a day (BID) | ORAL | 1 refills | Status: AC
  Filled 2024-04-22: qty 90, 45d supply, fill #0

## 2024-04-22 MED ORDER — INDOMETHACIN 50 MG PO CAPS
50.0000 mg | ORAL_CAPSULE | Freq: Three times a day (TID) | ORAL | 3 refills | Status: DC | PRN
  Filled 2024-04-22: qty 270, 90d supply, fill #0

## 2024-04-22 MED ORDER — ROSUVASTATIN CALCIUM 10 MG PO TABS
10.0000 mg | ORAL_TABLET | Freq: Every day | ORAL | 3 refills | Status: AC
  Filled 2024-04-22 – 2024-06-05 (×2): qty 90, 90d supply, fill #0

## 2024-04-22 MED ORDER — AMLODIPINE-OLMESARTAN 10-40 MG PO TABS
1.0000 | ORAL_TABLET | Freq: Every day | ORAL | 3 refills | Status: AC
  Filled 2024-04-22 – 2024-05-13 (×2): qty 90, 90d supply, fill #0
  Filled 2024-08-12: qty 90, 90d supply, fill #1

## 2024-05-13 ENCOUNTER — Other Ambulatory Visit (HOSPITAL_BASED_OUTPATIENT_CLINIC_OR_DEPARTMENT_OTHER): Payer: Self-pay

## 2024-05-13 DIAGNOSIS — E876 Hypokalemia: Secondary | ICD-10-CM | POA: Diagnosis not present

## 2024-05-13 DIAGNOSIS — R5383 Other fatigue: Secondary | ICD-10-CM | POA: Diagnosis not present

## 2024-05-13 DIAGNOSIS — J449 Chronic obstructive pulmonary disease, unspecified: Secondary | ICD-10-CM | POA: Diagnosis not present

## 2024-05-13 DIAGNOSIS — I10 Essential (primary) hypertension: Secondary | ICD-10-CM | POA: Diagnosis not present

## 2024-05-13 DIAGNOSIS — M545 Low back pain, unspecified: Secondary | ICD-10-CM | POA: Diagnosis not present

## 2024-05-13 DIAGNOSIS — R350 Frequency of micturition: Secondary | ICD-10-CM | POA: Diagnosis not present

## 2024-05-13 DIAGNOSIS — R52 Pain, unspecified: Secondary | ICD-10-CM | POA: Diagnosis not present

## 2024-05-13 DIAGNOSIS — Z1152 Encounter for screening for COVID-19: Secondary | ICD-10-CM | POA: Diagnosis not present

## 2024-05-13 DIAGNOSIS — R519 Headache, unspecified: Secondary | ICD-10-CM | POA: Diagnosis not present

## 2024-05-13 DIAGNOSIS — M436 Torticollis: Secondary | ICD-10-CM | POA: Diagnosis not present

## 2024-05-13 DIAGNOSIS — M109 Gout, unspecified: Secondary | ICD-10-CM | POA: Diagnosis not present

## 2024-05-13 DIAGNOSIS — N139 Obstructive and reflux uropathy, unspecified: Secondary | ICD-10-CM | POA: Diagnosis not present

## 2024-05-13 DIAGNOSIS — I480 Paroxysmal atrial fibrillation: Secondary | ICD-10-CM | POA: Diagnosis not present

## 2024-05-13 DIAGNOSIS — E1169 Type 2 diabetes mellitus with other specified complication: Secondary | ICD-10-CM | POA: Diagnosis not present

## 2024-05-13 MED ORDER — METHOCARBAMOL 500 MG PO TABS
500.0000 mg | ORAL_TABLET | Freq: Two times a day (BID) | ORAL | 0 refills | Status: AC | PRN
  Filled 2024-05-13: qty 20, 10d supply, fill #0

## 2024-05-13 MED ORDER — POTASSIUM CHLORIDE CRYS ER 20 MEQ PO TBCR
EXTENDED_RELEASE_TABLET | ORAL | 3 refills | Status: AC
  Filled 2024-05-13: qty 90, 90d supply, fill #0

## 2024-06-05 ENCOUNTER — Other Ambulatory Visit (HOSPITAL_BASED_OUTPATIENT_CLINIC_OR_DEPARTMENT_OTHER): Payer: Self-pay

## 2024-06-08 DIAGNOSIS — Z961 Presence of intraocular lens: Secondary | ICD-10-CM | POA: Diagnosis not present

## 2024-06-08 DIAGNOSIS — H52203 Unspecified astigmatism, bilateral: Secondary | ICD-10-CM | POA: Diagnosis not present

## 2024-06-08 DIAGNOSIS — H524 Presbyopia: Secondary | ICD-10-CM | POA: Diagnosis not present

## 2024-06-08 DIAGNOSIS — E119 Type 2 diabetes mellitus without complications: Secondary | ICD-10-CM | POA: Diagnosis not present

## 2024-06-11 ENCOUNTER — Other Ambulatory Visit (HOSPITAL_BASED_OUTPATIENT_CLINIC_OR_DEPARTMENT_OTHER): Payer: Self-pay

## 2024-06-16 DIAGNOSIS — E1151 Type 2 diabetes mellitus with diabetic peripheral angiopathy without gangrene: Secondary | ICD-10-CM | POA: Diagnosis not present

## 2024-06-16 DIAGNOSIS — M2042 Other hammer toe(s) (acquired), left foot: Secondary | ICD-10-CM | POA: Diagnosis not present

## 2024-06-16 DIAGNOSIS — M2041 Other hammer toe(s) (acquired), right foot: Secondary | ICD-10-CM | POA: Diagnosis not present

## 2024-06-16 DIAGNOSIS — I70203 Unspecified atherosclerosis of native arteries of extremities, bilateral legs: Secondary | ICD-10-CM | POA: Diagnosis not present

## 2024-06-16 DIAGNOSIS — B351 Tinea unguium: Secondary | ICD-10-CM | POA: Diagnosis not present

## 2024-06-16 DIAGNOSIS — M2142 Flat foot [pes planus] (acquired), left foot: Secondary | ICD-10-CM | POA: Diagnosis not present

## 2024-06-16 DIAGNOSIS — M2141 Flat foot [pes planus] (acquired), right foot: Secondary | ICD-10-CM | POA: Diagnosis not present

## 2024-06-16 DIAGNOSIS — L602 Onychogryphosis: Secondary | ICD-10-CM | POA: Diagnosis not present

## 2024-06-16 DIAGNOSIS — L84 Corns and callosities: Secondary | ICD-10-CM | POA: Diagnosis not present

## 2024-06-17 ENCOUNTER — Other Ambulatory Visit (HOSPITAL_BASED_OUTPATIENT_CLINIC_OR_DEPARTMENT_OTHER): Payer: Self-pay

## 2024-06-17 DIAGNOSIS — M7502 Adhesive capsulitis of left shoulder: Secondary | ICD-10-CM | POA: Diagnosis not present

## 2024-06-17 DIAGNOSIS — M25512 Pain in left shoulder: Secondary | ICD-10-CM | POA: Diagnosis not present

## 2024-06-17 MED ORDER — MELOXICAM 15 MG PO TABS
15.0000 mg | ORAL_TABLET | Freq: Every day | ORAL | 1 refills | Status: AC
  Filled 2024-06-17: qty 30, 30d supply, fill #0
  Filled 2024-10-06 (×2): qty 30, 30d supply, fill #1

## 2024-07-01 ENCOUNTER — Other Ambulatory Visit: Payer: Self-pay

## 2024-07-01 ENCOUNTER — Other Ambulatory Visit (HOSPITAL_BASED_OUTPATIENT_CLINIC_OR_DEPARTMENT_OTHER): Payer: Self-pay

## 2024-07-01 MED ORDER — METOPROLOL TARTRATE 50 MG PO TABS
50.0000 mg | ORAL_TABLET | Freq: Two times a day (BID) | ORAL | 3 refills | Status: AC
  Filled 2024-07-01: qty 40, 20d supply, fill #0
  Filled 2024-07-01: qty 140, 70d supply, fill #0

## 2024-07-09 ENCOUNTER — Other Ambulatory Visit (HOSPITAL_BASED_OUTPATIENT_CLINIC_OR_DEPARTMENT_OTHER): Payer: Self-pay

## 2024-07-09 DIAGNOSIS — M25512 Pain in left shoulder: Secondary | ICD-10-CM | POA: Diagnosis not present

## 2024-07-09 MED ORDER — TAMSULOSIN HCL 0.4 MG PO CAPS
0.4000 mg | ORAL_CAPSULE | Freq: Every day | ORAL | 3 refills | Status: AC
  Filled 2024-07-09: qty 90, 90d supply, fill #0

## 2024-07-10 ENCOUNTER — Other Ambulatory Visit (HOSPITAL_BASED_OUTPATIENT_CLINIC_OR_DEPARTMENT_OTHER): Payer: Self-pay

## 2024-10-05 ENCOUNTER — Other Ambulatory Visit (HOSPITAL_BASED_OUTPATIENT_CLINIC_OR_DEPARTMENT_OTHER): Payer: Self-pay

## 2024-10-05 MED ORDER — PANTOPRAZOLE SODIUM 40 MG PO TBEC
40.0000 mg | DELAYED_RELEASE_TABLET | Freq: Two times a day (BID) | ORAL | 3 refills | Status: AC
  Filled 2024-10-05: qty 180, 90d supply, fill #0

## 2024-10-05 MED ORDER — TAMSULOSIN HCL 0.4 MG PO CAPS
0.4000 mg | ORAL_CAPSULE | Freq: Every day | ORAL | 3 refills | Status: AC
  Filled 2024-10-05: qty 90, 90d supply, fill #0

## 2024-10-05 MED ORDER — AMLODIPINE-OLMESARTAN 10-40 MG PO TABS
1.0000 | ORAL_TABLET | Freq: Every day | ORAL | 3 refills | Status: AC
  Filled 2024-10-05: qty 90, 90d supply, fill #0

## 2024-10-06 ENCOUNTER — Other Ambulatory Visit (HOSPITAL_BASED_OUTPATIENT_CLINIC_OR_DEPARTMENT_OTHER): Payer: Self-pay
